# Patient Record
Sex: Female | Born: 1980 | Race: Black or African American | Hispanic: No | Marital: Married | Smoking: Never smoker
Health system: Southern US, Community
[De-identification: ages and names within clinical notes are randomized; demographics above are authoritative.]

## PROBLEM LIST (undated history)

## (undated) DIAGNOSIS — Z789 Other specified health status: Secondary | ICD-10-CM

## (undated) DIAGNOSIS — E039 Hypothyroidism, unspecified: Secondary | ICD-10-CM

## (undated) DIAGNOSIS — G4730 Sleep apnea, unspecified: Secondary | ICD-10-CM

---

## 2013-05-19 ENCOUNTER — Other Ambulatory Visit (HOSPITAL_COMMUNITY)
Admission: RE | Admit: 2013-05-19 | Discharge: 2013-05-19 | Disposition: A | Discharge status: Home / Self Care | Payer: Medicaid Other | Source: Ambulatory Visit | Attending: Emergency Medicine | Admitting: Emergency Medicine

## 2013-05-19 ENCOUNTER — Emergency Department (INDEPENDENT_AMBULATORY_CARE_PROVIDER_SITE_OTHER)
Admission: EM | Admit: 2013-05-19 | Discharge: 2013-05-19 | Disposition: A | Discharge status: Home / Self Care | Payer: Medicaid Other | Source: Home / Self Care | Attending: Emergency Medicine | Admitting: Emergency Medicine

## 2013-05-19 ENCOUNTER — Encounter (HOSPITAL_COMMUNITY): Payer: Self-pay | Admitting: Emergency Medicine

## 2013-05-19 DIAGNOSIS — N760 Acute vaginitis: Secondary | ICD-10-CM | POA: Insufficient documentation

## 2013-05-19 DIAGNOSIS — N930 Postcoital and contact bleeding: Secondary | ICD-10-CM

## 2013-05-19 DIAGNOSIS — B3731 Acute candidiasis of vulva and vagina: Secondary | ICD-10-CM

## 2013-05-19 DIAGNOSIS — B373 Candidiasis of vulva and vagina: Secondary | ICD-10-CM

## 2013-05-19 DIAGNOSIS — IMO0002 Reserved for concepts with insufficient information to code with codable children: Secondary | ICD-10-CM

## 2013-05-19 DIAGNOSIS — Z113 Encounter for screening for infections with a predominantly sexual mode of transmission: Secondary | ICD-10-CM | POA: Insufficient documentation

## 2013-05-19 LAB — POCT URINALYSIS DIP (DEVICE)
Bilirubin Urine: NEGATIVE
Glucose, UA: NEGATIVE mg/dL
Hgb urine dipstick: NEGATIVE
Nitrite: NEGATIVE
Urobilinogen, UA: 0.2 mg/dL (ref 0.0–1.0)

## 2013-05-19 LAB — POCT PREGNANCY, URINE: Preg Test, Ur: NEGATIVE

## 2013-05-19 MED ORDER — FLUCONAZOLE 150 MG PO TABS: 150.0000 mg | ORAL_TABLET | Freq: Once | ORAL | Status: DC

## 2013-05-19 NOTE — ED Notes (Signed)
Pt c/o lower abdominal pain x 3 month. Pain is worse during sexual intercourse. Feels a shooting pain. No diarrhea, n/v. No discharge. No problems with urinating or bowel movements. Patient is alert and oriented.

## 2013-05-19 NOTE — ED Provider Notes (Signed)
History     CSN: [id number]  Arrival date & time 05/19/13  1456   None     Chief Complaint  Patient presents with  . Abdominal Pain    (Consider location/radiation/quality/duration/timing/severity/associated sxs/prior treatment) HPI Comments: Pt presents c/o bleeding after intercourse for at least 6 months, pain with deep insertion during intercourse for at least 6 months, and sharp abdominal pains occasionally after intercourse for at least 3 months.     History reviewed. No pertinent past medical history.  Past Surgical History  Procedure Laterality Date  . Cesarean section      No family history on file.  History  Substance Use Topics  . Smoking status: Current Every Day Smoker    Types: Cigarettes  . Smokeless tobacco: Not on file  . Alcohol Use: No    OB History   Grav Para Term Preterm Abortions TAB SAB Ect Mult Living                  Review of Systems  Constitutional: Negative for fever and chills.  Eyes: Negative for visual disturbance.  Respiratory: Negative for cough and shortness of breath.   Cardiovascular: Negative for chest pain, palpitations and leg swelling.  Gastrointestinal: Negative for nausea, vomiting and abdominal pain.  Endocrine: Negative for polydipsia and polyuria.  Genitourinary: Positive for vaginal bleeding, vaginal pain, pelvic pain and dyspareunia. Negative for dysuria, urgency, frequency, hematuria, flank pain and genital sores.  Musculoskeletal: Negative for myalgias and arthralgias.  Skin: Negative for rash.  Neurological: Negative for dizziness, weakness and light-headedness.    Allergies  Review of patient's allergies indicates no known allergies.  Home Medications   Current Outpatient Rx  Name  Route  Sig  Dispense  Refill  . Multiple Vitamin (MULTIVITAMIN) tablet   Oral   Take 1 tablet by mouth daily.         . fluconazole (DIFLUCAN) 150 MG tablet   Oral   Take 1 tablet (150 mg total) by mouth once.   2  tablet   0     BP 132/75  Pulse 112  Temp(Src) 98.3 F (36.8 C) (Oral)  Resp 16  SpO2 100%  LMP 04/20/2013  Physical Exam  Nursing note and vitals reviewed. Constitutional: She is oriented to person, place, and time. Vital signs are normal. She appears well-developed and well-nourished. No distress.  HENT:  Head: Atraumatic.  Eyes: EOM are normal. Pupils are equal, round, and reactive to light.  Cardiovascular: Normal rate, regular rhythm and normal heart sounds.  Exam reveals no gallop and no friction rub.   No murmur heard. Pulmonary/Chest: Effort normal and breath sounds normal. No respiratory distress. She has no wheezes. She has no rales.  Abdominal: Soft. There is no tenderness.  Genitourinary: Cervix exhibits no motion tenderness and no discharge. Vaginal discharge (white, thin ) found.  Palpable polyp on the os of the cervix   Neurological: She is alert and oriented to person, place, and time. She has normal strength.  Skin: Skin is warm and dry. She is not diaphoretic.  Psychiatric: She has a normal mood and affect. Her behavior is normal. Judgment normal.    ED Course  Procedures (including critical care time)  Labs Reviewed  POCT URINALYSIS DIP (DEVICE) - Abnormal; Notable for the following:    Leukocytes, UA TRACE (*)    All other components within normal limits  POCT PREGNANCY, URINE   No results found.   1. Vulvovaginal candidiasis   2. Dyspareunia  3. PCB (post coital bleeding)       MDM  Discussed with pt that this problem needs to be worked up by her GYN.  She has an appt for next month.  In the meantime will treat for yeast infxn given the discharge.    Meds ordered this encounter  Medications  .       . fluconazole (DIFLUCAN) 150 MG tablet    Sig: Take 1 tablet (150 mg total) by mouth once.    Dispense:  2 tablet    Refill:  0           Graylon Good, PA-C 05/19/13 1659

## 2013-05-19 NOTE — ED Provider Notes (Signed)
Medical screening examination/treatment/procedure(s) were performed by non-physician practitioner and as supervising physician I was immediately available for consultation/collaboration.  Raynald Blend, MD 05/19/13 [phone]

## 2013-05-20 MED ORDER — AZITHROMYCIN 500 MG PO TABS: 1000.0000 mg | ORAL_TABLET | Freq: Once | ORAL | Status: DC

## 2013-05-23 NOTE — ED Notes (Signed)
Called patient and after confirming ID , discussed positive findings. Wad advised to pick up her RX ASAP, and to notify her partner, as this is transmissible

## 2013-05-23 NOTE — ED Notes (Signed)
Letter for [place] completed , faxed

## 2013-05-24 MED ORDER — METRONIDAZOLE 500 MG PO TABS: 500.0000 mg | ORAL_TABLET | Freq: Two times a day (BID) | ORAL | Status: DC

## 2013-05-24 NOTE — ED Notes (Signed)
Affirm:Gardnerella pos., Candida and Trich neg.  Discussed with Almedia Balls PA and order done for Flagyl. Rx. e-prescirbed to the [facility] on [address].  Pt. notified by [name] where pt. should pick up her Rx.   Pt. instructed to no alcohol while taking this medication also. Heather Kelly 05/24/2013

## 2015-06-15 ENCOUNTER — Encounter (HOSPITAL_COMMUNITY): Payer: Self-pay | Admitting: Emergency Medicine

## 2015-06-15 ENCOUNTER — Emergency Department (HOSPITAL_COMMUNITY)
Admission: EM | Admit: 2015-06-15 | Discharge: 2015-06-15 | Disposition: A | Discharge status: Home / Self Care | Payer: Medicaid Other | Attending: Emergency Medicine | Admitting: Emergency Medicine

## 2015-06-15 DIAGNOSIS — S199XXA Unspecified injury of neck, initial encounter: Secondary | ICD-10-CM | POA: Diagnosis not present

## 2015-06-15 DIAGNOSIS — Y92410 Unspecified street and highway as the place of occurrence of the external cause: Secondary | ICD-10-CM | POA: Insufficient documentation

## 2015-06-15 DIAGNOSIS — Y998 Other external cause status: Secondary | ICD-10-CM | POA: Diagnosis not present

## 2015-06-15 DIAGNOSIS — Z79899 Other long term (current) drug therapy: Secondary | ICD-10-CM | POA: Diagnosis not present

## 2015-06-15 DIAGNOSIS — S46812A Strain of other muscles, fascia and tendons at shoulder and upper arm level, left arm, initial encounter: Secondary | ICD-10-CM | POA: Diagnosis not present

## 2015-06-15 DIAGNOSIS — S39012A Strain of muscle, fascia and tendon of lower back, initial encounter: Secondary | ICD-10-CM

## 2015-06-15 DIAGNOSIS — Y9389 Activity, other specified: Secondary | ICD-10-CM | POA: Insufficient documentation

## 2015-06-15 DIAGNOSIS — Z720 Tobacco use: Secondary | ICD-10-CM | POA: Diagnosis not present

## 2015-06-15 DIAGNOSIS — S4992XA Unspecified injury of left shoulder and upper arm, initial encounter: Secondary | ICD-10-CM | POA: Diagnosis present

## 2015-06-15 MED ORDER — HYDROCODONE-ACETAMINOPHEN 5-325 MG PO TABS
1.0000 | ORAL_TABLET | Freq: Once | ORAL | Status: AC
  Administered 2015-06-15: 1 via ORAL
  Filled 2015-06-15: qty 1

## 2015-06-15 MED ORDER — CYCLOBENZAPRINE HCL 10 MG PO TABS
10.0000 mg | ORAL_TABLET | Freq: Once | ORAL | Status: AC
  Administered 2015-06-15: 10 mg via ORAL
  Filled 2015-06-15: qty 1

## 2015-06-15 MED ORDER — CYCLOBENZAPRINE HCL 10 MG PO TABS: 10.0000 mg | ORAL_TABLET | Freq: Two times a day (BID) | ORAL | Status: DC | PRN

## 2015-06-15 MED ORDER — NAPROXEN 500 MG PO TABS: 500.0000 mg | ORAL_TABLET | Freq: Two times a day (BID) | ORAL | Status: DC

## 2015-06-15 MED ORDER — IBUPROFEN 400 MG PO TABS
600.0000 mg | ORAL_TABLET | Freq: Once | ORAL | Status: AC
  Administered 2015-06-15: 600 mg via ORAL
  Filled 2015-06-15: qty 2
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
## 2015-09-01 ENCOUNTER — Inpatient Hospital Stay (HOSPITAL_COMMUNITY)
Admission: AD | Admit: 2015-09-01 | Discharge: 2015-09-01 | Disposition: A | Discharge status: Home / Self Care | Payer: Medicaid Other | Source: Ambulatory Visit | Attending: Obstetrics and Gynecology | Admitting: Obstetrics and Gynecology

## 2015-09-01 DIAGNOSIS — B9689 Other specified bacterial agents as the cause of diseases classified elsewhere: Secondary | ICD-10-CM

## 2015-09-01 DIAGNOSIS — Z202 Contact with and (suspected) exposure to infections with a predominantly sexual mode of transmission: Secondary | ICD-10-CM | POA: Diagnosis not present

## 2015-09-01 DIAGNOSIS — F17210 Nicotine dependence, cigarettes, uncomplicated: Secondary | ICD-10-CM | POA: Diagnosis not present

## 2015-09-01 DIAGNOSIS — A499 Bacterial infection, unspecified: Secondary | ICD-10-CM

## 2015-09-01 DIAGNOSIS — N760 Acute vaginitis: Secondary | ICD-10-CM | POA: Diagnosis not present

## 2015-09-01 DIAGNOSIS — N898 Other specified noninflammatory disorders of vagina: Secondary | ICD-10-CM | POA: Diagnosis present

## 2015-09-01 LAB — URINALYSIS, ROUTINE W REFLEX MICROSCOPIC
Bilirubin Urine: NEGATIVE
GLUCOSE, UA: NEGATIVE mg/dL
Hgb urine dipstick: NEGATIVE
Ketones, ur: NEGATIVE mg/dL
LEUKOCYTES UA: NEGATIVE
Nitrite: NEGATIVE
PH: 5.5 (ref 5.0–8.0)
Protein, ur: NEGATIVE mg/dL
Urobilinogen, UA: 0.2 mg/dL (ref 0.0–1.0)

## 2015-09-01 LAB — WET PREP, GENITAL
TRICH WET PREP: NONE SEEN
Yeast Wet Prep HPF POC: NONE SEEN

## 2015-09-01 LAB — RAPID HIV SCREEN (HIV 1/2 AB+AG)
HIV 1/2 Antibodies: NONREACTIVE
HIV-1 P24 ANTIGEN - HIV24: NONREACTIVE

## 2015-09-01 LAB — POCT PREGNANCY, URINE: Preg Test, Ur: NEGATIVE

## 2015-09-01 LAB — HEPATITIS B SURFACE ANTIGEN: HEP B S AG: NEGATIVE

## 2015-09-01 MED ORDER — AZITHROMYCIN 250 MG PO TABS
1000.0000 mg | ORAL_TABLET | Freq: Once | ORAL | Status: AC
  Administered 2015-09-01: 1000 mg via ORAL
  Filled 2015-09-01: qty 4

## 2015-09-01 MED ORDER — METRONIDAZOLE 500 MG PO TABS: 500.0000 mg | ORAL_TABLET | Freq: Two times a day (BID) | ORAL | Status: AC

## 2015-09-01 MED ORDER — CEFTRIAXONE SODIUM 250 MG IJ SOLR
250.0000 mg | Freq: Once | INTRAMUSCULAR | Status: AC
  Administered 2015-09-01: 250 mg via INTRAMUSCULAR
  Filled 2015-09-01: qty 250
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
## 2015-09-01 NOTE — MAU Note (Signed)
Pt left as she needed to.

## 2015-09-01 NOTE — Discharge Instructions (Signed)
Safe Sex Safe sex is about reducing the risk of giving or getting a sexually transmitted disease (STD). STDs are spread through sexual contact involving the genitals, mouth, or rectum. Some STDs can be cured and others cannot. Safe sex can also prevent unintended pregnancies.  WHAT ARE SOME SAFE SEX PRACTICES?  Limit your sexual activity to only one partner who is having sex with only you.  Talk to your partner about his or her past partners, past STDs, and drug use.  Use a condom every time you have sexual intercourse. This includes vaginal, oral, and anal sexual activity. Both females and males should wear condoms during oral sex. Only use latex or polyurethane condoms and water-based lubricants. Using petroleum-based lubricants or oils to lubricate a condom will weaken the condom and increase the chance that it will break. The condom should be in place from the beginning to the end of sexual activity. Wearing a condom reduces, but does not completely eliminate, your risk of getting or giving an STD. STDs can be spread by contact with infected body fluids and skin.  Get vaccinated for hepatitis B and HPV.  Avoid alcohol and recreational drugs, which can affect your judgment. You may forget to use a condom or participate in high-risk sex.  For females, avoid douching after sexual intercourse. Douching can spread an infection farther into the reproductive tract.  Check your body for signs of sores, blisters, rashes, or unusual discharge. See your health care provider if you notice any of these signs.  Avoid sexual contact if you have symptoms of an infection or are being treated for an STD. If you or your partner has herpes, avoid sexual contact when blisters are present. Use condoms at all other times.  If you are at risk of being infected with HIV, it is recommended that you take a prescription medicine daily to prevent HIV infection. This is called pre-exposure prophylaxis (PrEP). You are  considered at risk if:  You are a man who has sex with other men (MSM).  You are a heterosexual man or woman who is sexually active with more than one partner.  You take drugs by injection.  You are sexually active with a partner who has HIV.  Talk with your health care provider about whether you are at high risk of being infected with HIV. If you choose to begin PrEP, you should first be tested for HIV. You should then be tested every 3 months for as long as you are taking PrEP.  See your health care provider for regular screenings, exams, and tests for other STDs. Before having sex with a new partner, each of you should be screened for STDs and should talk about the results with each other. WHAT ARE THE BENEFITS OF SAFE SEX?   There is less chance of getting or giving an STD.  You can prevent unwanted or unintended pregnancies.  By discussing safe sex concerns with your partner, you may increase feelings of intimacy, comfort, trust, and honesty between the two of you. Document Released: 01/09/2005 Document Revised: 04/18/2014 Document Reviewed: 05/25/2012 [facility] Patient Information 2015 [place], [place]. This information is not intended to replace advice given to you by your health care provider. Make sure you discuss any questions you have with your health care provider. Chlamydia Test This a test to see if you have Chlamydia which is a common sexually transmitted disease (STD). You get this disease by having sexual contact (oral, vaginal, or anal) with an infected person. An  infected mother can spread the disease to her baby during childbirth. If this test is positive, you have a sexually transmitted disease (STD). DO NOT have unprotected sex until the treatment is complete and you have been retested and are negative. PREPARATION FOR TEST Your caregiver will use a swab to take a sample. The swab may be from the cervix, urethra, penis, anus, or throat. It may be possible to use a urine  sample, if the lab where the sample is sent is able to test urine for this disease. NORMAL FINDINGS  No Growth  Antibodies: less than 1:640 Ranges for normal findings may vary among different laboratories and hospitals. You should always check with your doctor after having lab work or other tests done to discuss the meaning of your test results and whether your values are considered within normal limits. MEANING OF TEST  Your caregiver will go over the test results with you and discuss the importance and meaning of your results, as well as treatment options and the need for additional tests if necessary. OBTAINING THE TEST RESULTS It is your responsibility to obtain your test results. Ask the lab or department performing the test when and how you will get your results. Document Released: 12/25/2004 Document Revised: 02/24/2012 Document Reviewed: 11/10/2008 [facility] Patient Information 2015 [place], [place]. This information is not intended to replace advice given to you by your health care provider. Make sure you discuss any questions you have with your health care provider. Gonorrhea Gonorrhea is an infection that can cause serious problems. If left untreated, the infection may:   Damage the female or female organs.   Cause women to be unable to have children (sterility).   Harm a fetus if the infected woman is pregnant.  It is important to get treatment for gonorrhea as soon as possible. It is also necessary that all your sexual partners be tested for the infection.  CAUSES  Gonorrhea is caused by bacteria called Neisseria gonorrhoeae. The infection is spread from person to person, usually by sexual contact (such as by anal, vaginal, or oral means). A newborn can contract the infection from his or her mother during birth.  SYMPTOMS  Some people with gonorrhea do not have symptoms. Symptoms may be different in females and males.  Females The most common symptoms are:   Pain in the  lower abdomen.   Fever with or without chills.  Other symptoms include:   Abnormal vaginal discharge.   Painful intercourse.   Burning or itching of the vagina or lips of the vagina.   Abnormal vaginal bleeding.   Pain when urinating.   Long-lasting (chronic) pain in the lower abdomen, especially during menstruation or intercourse.   Inability to become pregnant.   Going into premature labor.   Irritation, pain, bleeding, or discharge from the rectum. This may occur if the infection was spread by anal sex.   Sore throat or swollen lymph nodes in the neck. This may occur if the infection was spread by oral sex.  Males The most common symptoms are:   Discharge from the penis.   Pain or burning during urination.   Pain or swelling in the testicles. Other symptoms may include:   Irritation, pain, bleeding, or discharge from the rectum. This may occur if the infection was spread by anal sex.   Sore throat, fever, or swollen lymph nodes in the neck. This may occur if the infection was spread by oral sex.  DIAGNOSIS  A diagnosis is made  after a physical exam is done and a sample of discharge is examined under a microscope for the presence of the bacteria. The discharge may be taken from the urethra, cervix, throat, or rectum.  TREATMENT  Gonorrhea is treated with antibiotic medicines. It is important for treatment to begin as soon as possible. Early treatment may prevent some problems from developing.  HOME CARE INSTRUCTIONS   Take medicines only as directed by your health care provider.   Take your antibiotic medicine as directed by your health care provider. Finish the antibiotic even if you start to feel better. Incomplete treatment will put you at risk for continued infection.   Do not have sex until treatment is complete or as directed by your health care provider.   Keep all follow-up visits as directed by your health care provider.   Not all  test results are available during your visit. If your test results are not back during the visit, make an appointment with your health care provider to find out the results. Do not assume everything is normal if you have not heard from your health care provider or the medical facility. It is your responsibility to get your test results.  If you test positive for gonorrhea, inform your recent sexual partners. They need to be checked for gonorrhea even if they do not have symptoms. They may need treatment, even if they test negative for gonorrhea.  SEEK MEDICAL CARE IF:   You develop any bad reaction to the medicine you were prescribed. This may include:   A rash.   Nausea.   Vomiting.   Diarrhea.   Your symptoms do not improve after a few days of taking antibiotics.   Your symptoms get worse.   You develop increased pain, such as in the testicles (for males) or in the abdomen (for females).  You have a fever. MAKE SURE YOU:   Understand these instructions.  Will watch your condition.  Will get help right away if you are not doing well or get worse. Document Released: 11/29/2000 Document Revised: 04/18/2014 Document Reviewed: 06/09/2013 [facility] Patient Information 2015 [place], [place]. This information is not intended to replace advice given to you by your health care provider. Make sure you discuss any questions you have with your health care provider.

## 2015-09-01 NOTE — MAU Note (Signed)
Pt here because husband's other partner has an STD and pt wants STD and HIV testing

## 2015-09-01 NOTE — MAU Provider Note (Signed)
History     CSN: [id number]  Arrival date and time: 09/01/15 4098   First Provider Initiated Contact with Patient 09/01/15 1010      Chief Complaint  Patient presents with  . Vaginal Discharge    foul odor   Vaginal Discharge The patient's primary symptoms include a genital odor and vaginal discharge. The patient's pertinent negatives include no genital itching, genital lesions, genital rash, missed menses, pelvic pain or vaginal bleeding. This is a new problem. The current episode started in the past 7 days. The problem occurs constantly. The problem has been unchanged. The patient is experiencing no pain. She is not pregnant. Pertinent negatives include no abdominal pain, back pain, chills, constipation, diarrhea, dysuria, fever, headaches, nausea or vomiting. The vaginal discharge was milky. There has been no bleeding. She has not been passing clots. She has not been passing tissue. Nothing aggravates the symptoms. She has tried nothing for the symptoms. She is sexually active. Yes, her partner has an STD. Her menstrual history has been regular.   This is a 34 y.o. female who presents with complaint of vaginal odor and exposure to STDs.  States her husband's "other partner" called her and told her she needed to leave her husband and that she has gonorrhea and chlamydia.  States her husband did not tell her. Unsure if he has been treated. States has had discharge with odor but no pain or abnormal bleeding.   RN Note: Pt here because husband's other partner has an STD and pt wants STD and HIV testing          OB History    No data available      No past medical history on file.  Past Surgical History  Procedure Laterality Date  . Cesarean section      No family history on file.  Social History  Substance Use Topics  . Smoking status: Current Every Day Smoker    Types: Cigarettes  . Smokeless tobacco: Not on file  . Alcohol Use: Yes    Allergies: No Known  Allergies  Prescriptions prior to admission  Medication Sig Dispense Refill Last Dose  . amoxicillin (AMOXIL) 500 MG capsule Take 1,000 mg by mouth daily.   Past Week at Unknown time  . Prenatal Vit-Fe Fumarate-FA (PRENATAL MULTIVITAMIN) TABS tablet Take 1 tablet by mouth daily at 12 noon.   Past Week at Unknown time  . azithromycin (ZITHROMAX) 500 MG tablet Take 2 tablets (1,000 mg total) by mouth once. 2 tablet 0   . cyclobenzaprine (FLEXERIL) 10 MG tablet Take 1 tablet (10 mg total) by mouth 2 (two) times daily as needed for muscle spasms. 20 tablet 0   . fluconazole (DIFLUCAN) 150 MG tablet Take 1 tablet (150 mg total) by mouth once. 2 tablet 0   . metroNIDAZOLE (FLAGYL) 500 MG tablet Take 1 tablet (500 mg total) by mouth 2 (two) times daily. 14 tablet 0   . naproxen (NAPROSYN) 500 MG tablet Take 1 tablet (500 mg total) by mouth 2 (two) times daily. 30 tablet 0    Medical, Surgical, Family and Social histories reviewed and are listed above.  Medications and allergies reviewed.   Review of Systems  Constitutional: Negative for fever and chills.  Gastrointestinal: Negative for nausea, vomiting, abdominal pain, diarrhea and constipation.  Genitourinary: Positive for vaginal discharge. Negative for dysuria, pelvic pain and missed menses.  Musculoskeletal: Negative for back pain.  Neurological: Negative for headaches.  Other systems neg  Physical Exam  Blood pressure 126/73, pulse 79, temperature 97.6 F (36.4 C), resp. rate 18, height 5\' 9"  (1.753 m), weight 333 lb 8 oz (151.275 kg), last menstrual period 08/19/2015.  Physical Exam  Constitutional: She is oriented to person, place, and time. She appears well-developed and well-nourished. No distress.  HENT:  Head: Normocephalic.  Cardiovascular: Normal rate and regular rhythm.   Respiratory: No respiratory distress.  GI: Soft. She exhibits no distension. There is no tenderness. There is no rebound and no guarding.  Genitourinary:  Vaginal discharge (thin white) found.  No CMT Uterus not palpable due to habitus  Musculoskeletal: Normal range of motion.  Neurological: She is alert and oriented to person, place, and time.  Skin: Skin is warm and dry.  Psychiatric: She has a normal mood and affect.    MAU Course  Procedures  MDM STD testing done Rocephin ordered for GC treatment Zithromax ordered for Chlamydia treatment  Results for orders placed or performed during the hospital encounter of 09/01/15 (from the past 24 hour(s))  Urinalysis, Routine w reflex microscopic (not at [facility])     Status: Abnormal   Collection Time: 09/01/15  8:58 AM  Result Value Ref Range   Color, Urine YELLOW YELLOW   APPearance CLEAR CLEAR   Specific Gravity, Urine >1.030 (H) 1.005 - 1.030   pH 5.5 5.0 - 8.0   Glucose, UA NEGATIVE NEGATIVE mg/dL   Hgb urine dipstick NEGATIVE NEGATIVE   Bilirubin Urine NEGATIVE NEGATIVE   Ketones, ur NEGATIVE NEGATIVE mg/dL   Protein, ur NEGATIVE NEGATIVE mg/dL   Urobilinogen, UA 0.2 0.0 - 1.0 mg/dL   Nitrite NEGATIVE NEGATIVE   Leukocytes, UA NEGATIVE NEGATIVE  Pregnancy, urine POC     Status: None   Collection Time: 09/01/15  9:01 AM  Result Value Ref Range   Preg Test, Ur NEGATIVE NEGATIVE  Wet prep, genital     Status: Abnormal   Collection Time: 09/01/15 10:15 AM  Result Value Ref Range   Yeast Wet Prep HPF POC NONE SEEN NONE SEEN   Trich, Wet Prep NONE SEEN NONE SEEN   Clue Cells Wet Prep HPF POC FEW (A) NONE SEEN   WBC, Wet Prep HPF POC FEW (A) NONE SEEN  Rapid HIV screen (HIV 1/2 Ab+Ag)     Status: None   Collection Time: 09/01/15 10:40 AM  Result Value Ref Range   HIV-1 P24 Antigen - HIV24 NON REACTIVE NON REACTIVE   HIV 1/2 Antibodies NON REACTIVE NON REACTIVE   Interpretation (HIV Ag Ab)      A non reactive test result means that HIV 1 or HIV 2 antibodies and HIV 1 p24 antigen were not detected in the specimen.    Assessment and Plan  A:  STD exposure       Bacterial  Vaginosis  P;  Discharge home       Rx Metronidazole for BV       Safe sex reviewed        Discussed going with husband when he gets treated to verify        Offered partner tx, but she declined  [facility] 09/01/2015, 10:57 AM

## 2015-09-02 LAB — RPR: RPR Ser Ql: NONREACTIVE

## 2015-09-04 LAB — GC/CHLAMYDIA PROBE AMP (~~LOC~~) NOT AT ARMC
CHLAMYDIA, DNA PROBE: NEGATIVE
NEISSERIA GONORRHEA: NEGATIVE

## 2016-07-26 ENCOUNTER — Emergency Department (HOSPITAL_COMMUNITY)
Admission: EM | Admit: 2016-07-26 | Discharge: 2016-07-26 | Disposition: A | Discharge status: Home / Self Care | Payer: Medicaid Other | Attending: Emergency Medicine | Admitting: Emergency Medicine

## 2016-07-26 ENCOUNTER — Emergency Department (HOSPITAL_COMMUNITY): Payer: Medicaid Other

## 2016-07-26 ENCOUNTER — Encounter (HOSPITAL_COMMUNITY): Payer: Self-pay | Admitting: Emergency Medicine

## 2016-07-26 DIAGNOSIS — R519 Headache, unspecified: Secondary | ICD-10-CM

## 2016-07-26 DIAGNOSIS — R109 Unspecified abdominal pain: Secondary | ICD-10-CM | POA: Diagnosis not present

## 2016-07-26 DIAGNOSIS — Y92410 Unspecified street and highway as the place of occurrence of the external cause: Secondary | ICD-10-CM | POA: Insufficient documentation

## 2016-07-26 DIAGNOSIS — Z79899 Other long term (current) drug therapy: Secondary | ICD-10-CM | POA: Insufficient documentation

## 2016-07-26 DIAGNOSIS — R938 Abnormal findings on diagnostic imaging of other specified body structures: Secondary | ICD-10-CM | POA: Insufficient documentation

## 2016-07-26 DIAGNOSIS — Y939 Activity, unspecified: Secondary | ICD-10-CM | POA: Diagnosis not present

## 2016-07-26 DIAGNOSIS — M7918 Myalgia, other site: Secondary | ICD-10-CM

## 2016-07-26 DIAGNOSIS — M791 Myalgia: Secondary | ICD-10-CM | POA: Insufficient documentation

## 2016-07-26 DIAGNOSIS — F17210 Nicotine dependence, cigarettes, uncomplicated: Secondary | ICD-10-CM | POA: Insufficient documentation

## 2016-07-26 DIAGNOSIS — Y999 Unspecified external cause status: Secondary | ICD-10-CM | POA: Diagnosis not present

## 2016-07-26 DIAGNOSIS — R51 Headache: Secondary | ICD-10-CM | POA: Diagnosis present

## 2016-07-26 LAB — I-STAT BETA HCG BLOOD, ED (MC, WL, AP ONLY): I-stat hCG, quantitative: <5 m[IU]/mL (ref <5)

## 2016-07-26 MED ORDER — IOPAMIDOL (ISOVUE-300) INJECTION 61%
INTRAVENOUS | Status: AC
  Administered 2016-07-26: 100 mL
  Filled 2016-07-26: qty 100

## 2016-07-26 MED ORDER — METHOCARBAMOL 500 MG PO TABS
1000.0000 mg | ORAL_TABLET | Freq: Once | ORAL | Status: AC
Start: 2016-07-26 — End: 2016-07-26
  Administered 2016-07-26: 1000 mg via ORAL
  Filled 2016-07-26: qty 2

## 2016-07-26 MED ORDER — NAPROXEN 500 MG PO TABS: 500.0000 mg | ORAL_TABLET | Freq: Two times a day (BID) | ORAL | 0 refills | Status: DC | PRN

## 2016-07-26 MED ORDER — METHOCARBAMOL 500 MG PO TABS: 500.0000 mg | ORAL_TABLET | Freq: Two times a day (BID) | ORAL | 0 refills | Status: DC | PRN

## 2016-07-26 MED ORDER — KETOROLAC TROMETHAMINE 30 MG/ML IJ SOLN
30.0000 mg | Freq: Once | INTRAMUSCULAR | Status: AC
  Administered 2016-07-26: 30 mg via INTRAVENOUS
  Filled 2016-07-26: qty 1

## 2016-07-26 MED ORDER — HYDROCODONE-ACETAMINOPHEN 5-325 MG PO TABS: 1.0000 | ORAL_TABLET | Freq: Four times a day (QID) | ORAL | 0 refills | Status: DC | PRN
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
## 2016-07-26 NOTE — ED Notes (Signed)
PA states patient must have spinal CT's before x-rays

## 2016-10-28 ENCOUNTER — Emergency Department (HOSPITAL_COMMUNITY)
Admission: EM | Admit: 2016-10-28 | Discharge: 2016-10-28 | Disposition: A | Discharge status: Home / Self Care | Payer: Medicaid Other | Attending: Emergency Medicine | Admitting: Emergency Medicine

## 2016-10-28 ENCOUNTER — Encounter (HOSPITAL_COMMUNITY): Payer: Self-pay | Admitting: *Deleted

## 2016-10-28 DIAGNOSIS — Z79899 Other long term (current) drug therapy: Secondary | ICD-10-CM | POA: Diagnosis not present

## 2016-10-28 DIAGNOSIS — F17210 Nicotine dependence, cigarettes, uncomplicated: Secondary | ICD-10-CM | POA: Diagnosis not present

## 2016-10-28 DIAGNOSIS — N898 Other specified noninflammatory disorders of vagina: Secondary | ICD-10-CM | POA: Diagnosis present

## 2016-10-28 DIAGNOSIS — H109 Unspecified conjunctivitis: Secondary | ICD-10-CM | POA: Insufficient documentation

## 2016-10-28 DIAGNOSIS — Z202 Contact with and (suspected) exposure to infections with a predominantly sexual mode of transmission: Secondary | ICD-10-CM | POA: Diagnosis not present

## 2016-10-28 DIAGNOSIS — A5901 Trichomonal vulvovaginitis: Secondary | ICD-10-CM

## 2016-10-28 LAB — URINALYSIS, ROUTINE W REFLEX MICROSCOPIC
Bilirubin Urine: NEGATIVE
Glucose, UA: NEGATIVE mg/dL
Hgb urine dipstick: NEGATIVE
KETONES UR: NEGATIVE mg/dL
NITRITE: NEGATIVE
PH: 6 (ref 5.0–8.0)
PROTEIN: NEGATIVE mg/dL
Specific Gravity, Urine: 1.024 (ref 1.005–1.030)

## 2016-10-28 LAB — URINE MICROSCOPIC-ADD ON: RBC / HPF: NONE SEEN RBC/hpf (ref 0–5)

## 2016-10-28 LAB — POC URINE PREG, ED: PREG TEST UR: NEGATIVE

## 2016-10-28 LAB — WET PREP, GENITAL
SPERM: NONE SEEN
Yeast Wet Prep HPF POC: NONE SEEN

## 2016-10-28 MED ORDER — TETRACAINE HCL 0.5 % OP SOLN
2.0000 [drp] | Freq: Once | OPHTHALMIC | Status: AC
  Administered 2016-10-28: 2 [drp] via OPHTHALMIC
  Filled 2016-10-28: qty 4

## 2016-10-28 MED ORDER — AZITHROMYCIN 250 MG PO TABS
1000.0000 mg | ORAL_TABLET | Freq: Once | ORAL | Status: AC
  Administered 2016-10-28: 1000 mg via ORAL
  Filled 2016-10-28: qty 4

## 2016-10-28 MED ORDER — FLUORESCEIN SODIUM 1 MG OP STRP
1.0000 | ORAL_STRIP | Freq: Once | OPHTHALMIC | Status: AC
  Administered 2016-10-28: 1 via OPHTHALMIC
  Filled 2016-10-28: qty 1

## 2016-10-28 MED ORDER — ERYTHROMYCIN 5 MG/GM OP OINT: TOPICAL_OINTMENT | OPHTHALMIC | 0 refills | Status: DC

## 2016-10-28 MED ORDER — LIDOCAINE HCL 1 % IJ SOLN
INTRAMUSCULAR | Status: AC
  Administered 2016-10-28: 20 mL
  Filled 2016-10-28: qty 20

## 2016-10-28 MED ORDER — CEFTRIAXONE SODIUM 250 MG IJ SOLR
250.0000 mg | Freq: Once | INTRAMUSCULAR | Status: AC
  Administered 2016-10-28: 250 mg via INTRAMUSCULAR
  Filled 2016-10-28: qty 250

## 2016-10-28 MED ORDER — METRONIDAZOLE 500 MG PO TABS: 500.0000 mg | ORAL_TABLET | Freq: Two times a day (BID) | ORAL | 0 refills | Status: DC

## 2016-10-28 NOTE — ED Provider Notes (Signed)
[facility]EMER[facility] DEPT Provider Note   CSN: [id number] Arrival date & time: 10/28/16  1145     History   Chief Complaint Chief Complaint  Patient presents with  . Eye Drainage  . Exposure to STD    HPI Heather Kelly is a 35 y.o. female.  35 year old African-American female with no significant past medical history presents to the ED today with multiple complaints including right eye drainage and pruritus along with vaginal discharge and odor after being exposed to STD. Patient states that approximately 2 days ago she woke up with right eye crusting and mild swelling. Patient states that this self resolves as the day progresses. Denies any visual changes. States this is gradually improving. She has not tried anything at home. She was concerned she might have "pinkeye". Patient also reports mild right eye tearing. This is associated with clear rhinorrhea, sinus congestion, sore throat that resolved yesterday.  Patient states that she has had a white, malodorous vaginal discharge for the past 2 weeks. Patient states she had unprotected sex 2 months ago with one female partner. She informed yesterday that he tested positive for gonorrhea, chlamydia, BV and that she needed to be tested and treated. Patient denies any fever, chills, headache, vision changes, chest pain, shortness of breath, abdominal pain, nausea, emesis, urinary symptoms, vaginal bleeding, numbness/tingling.    The history is provided by the patient.  Exposure to STD  Pertinent negatives include no chest pain, no abdominal pain, no headaches and no shortness of breath.    History reviewed. No pertinent past medical history.  There are no active problems to display for this patient.   Past Surgical History:  Procedure Laterality Date  . CESAREAN SECTION    . CESAREAN SECTION      OB History    No data available       Home Medications    Prior to Admission medications   Medication Sig Start Date End Date Taking?  Authorizing Provider  folic acid (FOLVITE) 0.5 MG tablet Take 0.5 mg by mouth daily.    Historical Provider, MD  HYDROcodone-acetaminophen (NORCO) 5-325 MG tablet Take 1 tablet by mouth every 6 (six) hours as needed for moderate pain. 07/26/16   Chase Picket Ward, PA-C  methocarbamol (ROBAXIN) 500 MG tablet Take 1 tablet (500 mg total) by mouth 2 (two) times daily as needed for muscle spasms. 07/26/16   Chase Picket Ward, PA-C  Multiple Vitamins-Minerals (MULTIVITAMIN WITH MINERALS) tablet Take 1 tablet by mouth daily.    Historical Provider, MD  naproxen (NAPROSYN) 500 MG tablet Take 1 tablet (500 mg total) by mouth 2 (two) times daily as needed. 07/26/16   Chase Picket Ward, PA-C  Omega-3 Fatty Acids (FISH OIL) 1000 MG CAPS Take 1,000 mg by mouth daily.    Historical Provider, MD  vitamin C (ASCORBIC ACID) 500 MG tablet Take 500 mg by mouth daily.    Historical Provider, MD    Family History No family history on file.  Social History Social History  Substance Use Topics  . Smoking status: Current Every Day Smoker    Types: Cigarettes  . Smokeless tobacco: Never Used  . Alcohol use Yes     Allergies   Patient has no known allergies.   Review of Systems Review of Systems  Constitutional: Negative for chills and fever.  HENT: Negative for ear pain and sore throat.   Eyes: Positive for discharge and itching. Negative for photophobia, pain and visual disturbance.  Respiratory: Negative for  cough and shortness of breath.   Cardiovascular: Negative for chest pain and palpitations.  Gastrointestinal: Negative for abdominal pain, diarrhea, nausea and vomiting.  Genitourinary: Positive for vaginal discharge and vaginal pain. Negative for dysuria, flank pain, frequency, hematuria, urgency and vaginal bleeding.  Musculoskeletal: Negative for arthralgias and back pain.  Skin: Negative for color change and rash.  Neurological: Negative for dizziness, syncope, weakness, light-headedness,  numbness and headaches.  All other systems reviewed and are negative.    Physical Exam Updated Vital Signs BP 121/65 (BP Location: Right Arm)   Pulse 84   Temp 98.2 F (36.8 C) (Oral)   Resp 18   LMP 10/14/2016   SpO2 94%   Physical Exam  Constitutional: She appears well-developed and well-nourished. No distress.  HENT:  Head: Normocephalic and atraumatic.  Right Ear: Tympanic membrane, external ear and ear canal normal.  Left Ear: Tympanic membrane, external ear and ear canal normal.  Nose: No rhinorrhea.  Mouth/Throat: Uvula is midline, oropharynx is clear and moist and mucous membranes are normal.  Eyes: Conjunctivae, EOM and lids are normal. Pupils are equal, round, and reactive to light. Lids are everted and swept, no foreign bodies found. Right eye exhibits no discharge, no exudate and no hordeolum. Left eye exhibits no discharge, no exudate and no hordeolum. Right conjunctiva is not injected. Left conjunctiva is not injected. No scleral icterus.  Slit lamp exam:      The right eye shows no corneal abrasion, no corneal ulcer, no foreign body and no fluorescein uptake.  No signs of purulent discharge. No lid edema.  Neck: Normal range of motion. Neck supple. No thyromegaly present.  Cardiovascular: Normal rate, regular rhythm, normal heart sounds and intact distal pulses.   Pulmonary/Chest: Effort normal and breath sounds normal.  Abdominal: Soft. Bowel sounds are normal. She exhibits no distension. There is no tenderness. There is no rebound and no guarding.  Genitourinary: Vagina normal. There is no rash or tenderness on the right labia. There is no rash or tenderness on the left labia. Cervix exhibits no motion tenderness and no discharge. Right adnexum displays no mass, no tenderness and no fullness. Left adnexum displays no mass, no tenderness and no fullness. No erythema, tenderness or bleeding in the vagina.  Genitourinary Comments: Chaperone present for exam.    Musculoskeletal: Normal range of motion.  Lymphadenopathy:    She has no cervical adenopathy.  Neurological: She is alert.  Skin: Skin is warm and dry. Capillary refill takes less than 2 seconds.  Nursing note and vitals reviewed.     Visual Acuity  Right Eye Distance: 20/30 Left Eye Distance: 20/30 Bilateral Distance: 20/30  Right Eye Near:   Left Eye Near:    Bilateral Near:     ED Treatments / Results  Labs (all labs ordered are listed, but only abnormal results are displayed) Labs Reviewed  WET PREP, GENITAL - Abnormal; Notable for the following:       Result Value   Trich, Wet Prep PRESENT (*)    Clue Cells Wet Prep HPF POC PRESENT (*)    WBC, Wet Prep HPF POC FEW (*)    All other components within normal limits  URINALYSIS, ROUTINE W REFLEX MICROSCOPIC (NOT [facility]) - Abnormal; Notable for the following:    APPearance CLOUDY (*)    Leukocytes, UA SMALL (*)    All other components within normal limits  URINE MICROSCOPIC-ADD ON - Abnormal; Notable for the following:    Squamous Epithelial /  LPF 0-5 (*)    Bacteria, UA FEW (*)    All other components within normal limits  URINE CULTURE  RPR  HIV ANTIBODY (ROUTINE TESTING)  POC URINE PREG, ED  GC/CHLAMYDIA PROBE AMP ([place]) NOT AT [facility]    EKG  EKG Interpretation None       Radiology No results found.  Procedures Procedures (including critical care time)  Medications Ordered in ED Medications  cefTRIAXone (ROCEPHIN) injection 250 mg (250 mg Intramuscular Given 10/28/16 1703)  azithromycin (ZITHROMAX) tablet 1,000 mg (1,000 mg Oral Given 10/28/16 1703)  fluorescein ophthalmic strip 1 strip (1 strip Right Eye Given 10/28/16 1703)  tetracaine (PONTOCAINE) 0.5 % ophthalmic solution 2 drop (2 drops Right Eye Given 10/28/16 1703)  lidocaine (XYLOCAINE) 1 % (with pres) injection (20 mLs  Given 10/28/16 1703)     Initial Impression / Assessment and Plan / ED Course  I have reviewed the triage vital  signs and the nursing notes.  Pertinent labs & imaging results that were available during my care of the patient were reviewed by me and considered in my medical decision making (see chart for details).  Clinical Course   Patient treated in the ED for STI with rocephin, azithromycin, and flagly given positive for trich. Educated patient to safe practice with flagyl. Patient advised to inform and treat all sexual partners.  Pt advised on safe sex practices and understands that they have GC/Chlamydia cultures pending and will result in 2-3 days. HIV and RPR sent. Pt encouraged to follow up at local health department for future STI checks. No concern for PID given no CMT. Pelvic and abd exam was unremarkable.   Patient presentation consistent with conjunctivitis likely viral.  No purulent discharge, corneal abrasions, entrapment, consensual photophobia, or dendritic staining with fluorescein study.  Presentation non-concerning for iritis, bacterial conjunctivitis, corneal abrasions, or HSV.  Visual acuity was normal. No visual disturbance. Abx given due to history of purulent discharge.  Personal hygiene and frequent handwashing discussed.  Patient advised to followup with ophthalmologist if symptoms persist or worsen in any way including vision change. Pt is hemodynamically stable, in NAD, & able to ambulate in the ED. Pt has no complaints prior to dc. Pt is comfortable with above plan and is stable for discharge at this time. All questions were answered prior to disposition. Strict return precautions for f/u to the ED were discussed.     Final Clinical Impressions(s) / ED Diagnoses   Final diagnoses:  Conjunctivitis of right eye, unspecified conjunctivitis type  Trichomonal vaginitis  STD exposure    New Prescriptions Discharge Medication List as of 10/28/2016  5:44 PM    START taking these medications   Details  erythromycin ophthalmic ointment Place a 1/2 inch ribbon of ointment into the  lower eyelid of right eye 4 times per day for 5 days., Print    metroNIDAZOLE (FLAGYL) 500 MG tablet Take 1 tablet (500 mg total) by mouth 2 (two) times daily with a meal. DO NOT CONSUME ALCOHOL WHILE TAKING THIS MEDICATION., Starting Mon 10/28/2016, Print         Rise MuKenneth T Leaphart, PA-C 10/28/16 1801    Tilden FossaElizabeth Rees, MD 10/30/16 [phone]

## 2016-10-28 NOTE — Discharge Instructions (Signed)
Your exam is consistent with Trichomonas. Please take the Flagyl antibiotics 2 times a day for 7 days. If further results are positive you will be notified. Please return to the ED if he develops worsening symptoms including fever, vomiting, worsening abdominal pain, worsening vaginal pain. Please do not drink alcohol will taking the flagyl.  I'm also giving you an antibiotic ointment to apply to the right eye 4 times a day for 5 days. If your symptoms do not improve within 5-7 days follow-up with an ophthalmologist. Return to the Ed if your symptoms worsen. Try warm compresses.

## 2016-10-28 NOTE — ED Triage Notes (Signed)
Pt complains of right drainage and crusting over since this morning. Pt also complains of vaginal odor for the past 2 weeks. Pt states was called by a person she had unprotected sex with 2 months ago and was told they tested positive for chlamydia and gonorrhea. Pt states she has 3/10 pain in her pelvic area.

## 2016-10-29 LAB — GC/CHLAMYDIA PROBE AMP (~~LOC~~) NOT AT ARMC
CHLAMYDIA, DNA PROBE: POSITIVE — AB
Neisseria Gonorrhea: NEGATIVE

## 2016-10-29 LAB — URINE CULTURE

## 2016-10-29 LAB — RPR: RPR Ser Ql: NONREACTIVE

## 2016-10-29 LAB — HIV ANTIBODY (ROUTINE TESTING W REFLEX): HIV SCREEN 4TH GENERATION: NONREACTIVE

## 2017-05-05 ENCOUNTER — Encounter (HOSPITAL_COMMUNITY): Payer: Self-pay | Admitting: Emergency Medicine

## 2017-05-05 ENCOUNTER — Emergency Department (HOSPITAL_COMMUNITY)
Admission: EM | Admit: 2017-05-05 | Discharge: 2017-05-05 | Disposition: A | Discharge status: Home / Self Care | Payer: Medicaid Other | Attending: Emergency Medicine | Admitting: Emergency Medicine

## 2017-05-05 DIAGNOSIS — N760 Acute vaginitis: Secondary | ICD-10-CM | POA: Diagnosis not present

## 2017-05-05 DIAGNOSIS — F17210 Nicotine dependence, cigarettes, uncomplicated: Secondary | ICD-10-CM | POA: Diagnosis not present

## 2017-05-05 DIAGNOSIS — L03011 Cellulitis of right finger: Secondary | ICD-10-CM | POA: Diagnosis not present

## 2017-05-05 DIAGNOSIS — B9689 Other specified bacterial agents as the cause of diseases classified elsewhere: Secondary | ICD-10-CM | POA: Insufficient documentation

## 2017-05-05 DIAGNOSIS — M7989 Other specified soft tissue disorders: Secondary | ICD-10-CM | POA: Diagnosis present

## 2017-05-05 DIAGNOSIS — Z79899 Other long term (current) drug therapy: Secondary | ICD-10-CM | POA: Diagnosis not present

## 2017-05-05 MED ORDER — DOXYCYCLINE HYCLATE 100 MG PO CAPS: 100.0000 mg | ORAL_CAPSULE | Freq: Two times a day (BID) | ORAL | 0 refills | Status: DC

## 2017-05-05 MED ORDER — METRONIDAZOLE 500 MG PO TABS: 500.0000 mg | ORAL_TABLET | Freq: Two times a day (BID) | ORAL | 0 refills | Status: DC

## 2017-05-05 NOTE — ED Triage Notes (Signed)
Right ring finger with redness and swelling and pain around nail bed. Patient also adds that she thinks she has bacterial vaginitis and would like to get that checked.

## 2017-05-05 NOTE — ED Notes (Signed)
Pt verbalized understanding of d/c instructions and has no further questions. Pt is stable, A&Ox4, VSS.  

## 2017-05-05 NOTE — ED Notes (Signed)
Finger swelling around nail bed after cutting her nails

## 2017-05-05 NOTE — ED Provider Notes (Signed)
[facility]ER[facility] DEPT Provider Note   CSN: [id number] Arrival date & time: 05/05/17  1754   By signing my name below, I, Teofilo Pod, attest that this documentation has been prepared under the direction and in the presence of Langston Masker, [place]. Electronically Signed: Teofilo Pod, ED Scribe. 05/05/2017. 8:17 PM.   History   Chief Complaint Chief Complaint  Patient presents with  . Finger Injury    right ring finger parynchyma   The history is provided by the patient. No language interpreter was used.   HPI Comments:  Heather Kelly is a 36 y.o. female who presents to the Emergency Department complaining of ongoing swelling to the right ring finger for several days. Pt notes a red, swollen area to the nailbed of the finger. Pt also reports a concern for BV and would like to be treated and states that her symptoms are similar to a previous episode of BV. Pt denies any concern for STD. No alleviating factors noted. Pt denies other associated symptoms.     History reviewed. No pertinent past medical history.  There are no active problems to display for this patient.   Past Surgical History:  Procedure Laterality Date  . CESAREAN SECTION    . CESAREAN SECTION      OB History    No data available       Home Medications    Prior to Admission medications   Medication Sig Start Date End Date Taking? Authorizing Provider  erythromycin ophthalmic ointment Place a 1/2 inch ribbon of ointment into the lower eyelid of right eye 4 times per day for 5 days. 10/28/16   Rise Mu, PA-C  folic acid (FOLVITE) 0.5 MG tablet Take 0.5 mg by mouth daily.    [provider]  HYDROcodone-acetaminophen (NORCO) 5-325 MG tablet Take 1 tablet by mouth every 6 (six) hours as needed for moderate pain. Patient not taking: Reported on 10/28/2016 07/26/16   Ward, Chase Picket, PA-C  methocarbamol (ROBAXIN) 500 MG tablet Take 1 tablet (500 mg total) by mouth 2 (two) times  daily as needed for muscle spasms. Patient not taking: Reported on 10/28/2016 07/26/16   Ward, Chase Picket, PA-C  metroNIDAZOLE (FLAGYL) 500 MG tablet Take 1 tablet (500 mg total) by mouth 2 (two) times daily with a meal. DO NOT CONSUME ALCOHOL WHILE TAKING THIS MEDICATION. 10/28/16   Rise Mu, PA-C  Multiple Vitamins-Minerals (MULTIVITAMIN WITH MINERALS) tablet Take 1 tablet by mouth daily.    [provider]  naproxen (NAPROSYN) 500 MG tablet Take 1 tablet (500 mg total) by mouth 2 (two) times daily as needed. Patient not taking: Reported on 10/28/2016 07/26/16   Ward, Chase Picket, PA-C    Family History History reviewed. No pertinent family history.  Social History Social History  Substance Use Topics  . Smoking status: Current Every Day Smoker    Types: Cigarettes  . Smokeless tobacco: Never Used  . Alcohol use Yes     Allergies   Patient has no known allergies.   Review of Systems Review of Systems  Genitourinary: Positive for vaginal discharge.  Musculoskeletal: Positive for arthralgias.     Physical Exam Updated Vital Signs BP 126/63 (BP Location: Left Arm)   Pulse 70   Temp 99.5 F (37.5 C) (Oral)   Resp 19   Ht 5\' 9"  (1.753 m)   Wt 277 lb 7 oz (125.8 kg)   SpO2 100%   BMI 40.97 kg/m   Physical Exam  Constitutional: She appears well-developed and well-nourished. No distress.  HENT:  Head: Normocephalic and atraumatic.  Eyes: Conjunctivae are normal.  Cardiovascular: Normal rate.   Pulmonary/Chest: Effort normal.  Abdominal: She exhibits no distension.  Neurological: She is alert.  Skin: Skin is warm and dry.  Psychiatric: She has a normal mood and affect.  Nursing note and vitals reviewed.    ED Treatments / Results  DIAGNOSTIC STUDIES:  Oxygen Saturation is 100% on RA, normal by my interpretation.    COORDINATION OF CARE:  8:17 PM Discussed treatment plan with pt at bedside and pt agreed to plan.   Labs (all labs  ordered are listed, but only abnormal results are displayed) Labs Reviewed - No data to display  EKG  EKG Interpretation None       Radiology No results found.  Procedures Procedures (including critical care time)  Medications Ordered in ED Medications - No data to display   Initial Impression / Assessment and Plan / ED Course  I have reviewed the triage vital signs and the nursing notes.  Pertinent labs & imaging results that were available during my care of the patient were reviewed by me and considered in my medical decision making (see chart for details).       Final Clinical Impressions(s) / ED Diagnoses   Final diagnoses:  Paronychia of finger of right hand  BV (bacterial vaginosis)    New Prescriptions New Prescriptions   No medications on file   Meds ordered this encounter  Medications  . metroNIDAZOLE (FLAGYL) 500 MG tablet    Sig: Take 1 tablet (500 mg total) by mouth 2 (two) times daily.    Dispense:  14 tablet    Refill:  0    Order Specific Question:   Supervising Provider    Answer:   MILLER, BRIAN [[id number]]  . doxycycline (VIBRAMYCIN) 100 MG capsule    Sig: Take 1 capsule (100 mg total) by mouth 2 (two) times daily.    Dispense:  20 capsule    Refill:  0    Order Specific Question:   Supervising Provider    Answer:   Eber HongMILLER, BRIAN [[id number]]   An After Visit Summary was printed and given to the patient.  I personally performed the services in this documentation, which was scribed in my presence.  The recorded information has been reviewed and considered.   Barnet PallKaren SofiaPAC.    Osie CheeksSofia, Leslie K, PA-C 05/06/17 0134    Benjiman CorePickering, Nathan, MD 05/07/17 2100

## 2018-01-12 ENCOUNTER — Other Ambulatory Visit: Payer: Self-pay | Admitting: Internal Medicine

## 2018-01-12 DIAGNOSIS — N926 Irregular menstruation, unspecified: Secondary | ICD-10-CM

## 2018-01-14 ENCOUNTER — Ambulatory Visit
Admission: RE | Admit: 2018-01-14 | Discharge: 2018-01-14 | Disposition: A | Discharge status: Home / Self Care | Payer: Self-pay | Source: Ambulatory Visit | Attending: Internal Medicine | Admitting: Internal Medicine

## 2018-01-14 DIAGNOSIS — N926 Irregular menstruation, unspecified: Secondary | ICD-10-CM

## 2018-03-03 ENCOUNTER — Ambulatory Visit (HOSPITAL_COMMUNITY)
Admission: EM | Admit: 2018-03-03 | Discharge: 2018-03-03 | Disposition: A | Discharge status: Home / Self Care | Payer: Medicaid Other | Attending: Family Medicine | Admitting: Family Medicine

## 2018-03-03 ENCOUNTER — Encounter (HOSPITAL_COMMUNITY): Payer: Self-pay | Admitting: Emergency Medicine

## 2018-03-03 DIAGNOSIS — J029 Acute pharyngitis, unspecified: Secondary | ICD-10-CM | POA: Diagnosis not present

## 2018-03-03 LAB — POCT RAPID STREP A: Streptococcus, Group A Screen (Direct): NEGATIVE

## 2018-03-03 MED ORDER — CETIRIZINE-PSEUDOEPHEDRINE ER 5-120 MG PO TB12: 1.0000 | ORAL_TABLET | Freq: Every day | ORAL | 0 refills | Status: DC

## 2018-03-03 MED ORDER — IPRATROPIUM BROMIDE 0.06 % NA SOLN: 2.0000 | Freq: Four times a day (QID) | NASAL | 0 refills | Status: DC

## 2018-03-03 MED ORDER — FLUTICASONE PROPIONATE 50 MCG/ACT NA SUSP: 2.0000 | Freq: Every day | NASAL | 0 refills | Status: DC

## 2018-03-03 NOTE — Discharge Instructions (Signed)
Rapid strep negative. Symptoms are most likely due to viral illness. Flonase, Atrovent, Zyrtec-D for nasal congestion/drainage. You can use over the counter nasal saline rinse such as neti pot for nasal congestion. Monitor for any worsening of symptoms, swelling of the throat, trouble breathing, trouble swallowing, follow up for reevaluation.   For sore throat try using a honey-based tea. Use 3 teaspoons of honey with juice squeezed from half lemon. Place shaved pieces of ginger into 1/2-1 cup of water and warm over stove top. Then mix the ingredients and repeat every 4 hours as needed.

## 2018-03-03 NOTE — ED Triage Notes (Signed)
Pt sts sore throat; pt sts had recent hx of strep

## 2018-03-03 NOTE — ED Provider Notes (Signed)
[facility]    CSN: [id number] Arrival date & time: 03/03/18  1825     History   Chief Complaint Chief Complaint  Patient presents with  . Sore Throat    HPI Heather Kelly is a 37 y.o. female.   36 year old female comes in for 1 day history of sore throat.  States it feels like strep.  She is also had a few week history of postnasal drip.  Denies rhinorrhea, nasal congestion.  Has intermittent productive cough due to postnasal drip.  Denies fever, chills, night sweats.  Has not taken anything for the symptoms.  Current every day smoker.      History reviewed. No pertinent past medical history.  There are no active problems to display for this patient.   Past Surgical History:  Procedure Laterality Date  . CESAREAN SECTION    . CESAREAN SECTION      OB History    No data available       Home Medications    Prior to Admission medications   Medication Sig Start Date End Date Taking? Authorizing Provider  cetirizine-pseudoephedrine (ZYRTEC-D) 5-120 MG tablet Take 1 tablet by mouth daily. 03/03/18   Cathie Hoops, Mosi Hannold V, PA-C  doxycycline (VIBRAMYCIN) 100 MG capsule Take 1 capsule (100 mg total) by mouth 2 (two) times daily. 05/05/17   Elson Areas, PA-C  erythromycin ophthalmic ointment Place a 1/2 inch ribbon of ointment into the lower eyelid of right eye 4 times per day for 5 days. 10/28/16   Rise Mu, PA-C  fluticasone (FLONASE) 50 MCG/ACT nasal spray Place 2 sprays into both nostrils daily. 03/03/18   Cathie Hoops, Jessup Ogas V, PA-C  folic acid (FOLVITE) 0.5 MG tablet Take 0.5 mg by mouth daily.    [provider]  HYDROcodone-acetaminophen (NORCO) 5-325 MG tablet Take 1 tablet by mouth every 6 (six) hours as needed for moderate pain. Patient not taking: Reported on 10/28/2016 07/26/16   Ward, Chase Picket, PA-C  ipratropium (ATROVENT) 0.06 % nasal spray Place 2 sprays into both nostrils 4 (four) times daily. 03/03/18   Cathie Hoops, Quadre Bristol V, PA-C  methocarbamol (ROBAXIN)  500 MG tablet Take 1 tablet (500 mg total) by mouth 2 (two) times daily as needed for muscle spasms. Patient not taking: Reported on 10/28/2016 07/26/16   Ward, Chase Picket, PA-C  metroNIDAZOLE (FLAGYL) 500 MG tablet Take 1 tablet (500 mg total) by mouth 2 (two) times daily. 05/05/17   Elson Areas, PA-C  Multiple Vitamins-Minerals (MULTIVITAMIN WITH MINERALS) tablet Take 1 tablet by mouth daily.    [provider]  naproxen (NAPROSYN) 500 MG tablet Take 1 tablet (500 mg total) by mouth 2 (two) times daily as needed. Patient not taking: Reported on 10/28/2016 07/26/16   Ward, Chase Picket, PA-C    Family History History reviewed. No pertinent family history.  Social History Social History   Tobacco Use  . Smoking status: Current Every Day Smoker    Types: Cigarettes  . Smokeless tobacco: Never Used  Substance Use Topics  . Alcohol use: Yes  . Drug use: No     Allergies   Patient has no known allergies.   Review of Systems Review of Systems  Reason unable to perform ROS: See HPI as above.     Physical Exam Triage Vital Signs ED Triage Vitals [03/03/18 1906]  Enc Vitals Group     BP 140/76     Pulse Rate 70     Resp 18  Temp 98.3 F (36.8 C)     Temp Source Oral     SpO2 97 %     Weight      Height      Head Circumference      Peak Flow      Pain Score 7     Pain Loc      Pain Edu?      Excl. in GC?    No data found.  Updated Vital Signs BP 140/76 (BP Location: Left Arm)   Pulse 70   Temp 98.3 F (36.8 C) (Oral)   Resp 18   SpO2 97%   Physical Exam  Constitutional: She is oriented to person, place, and time. She appears well-developed and well-nourished. No distress.  HENT:  Head: Normocephalic and atraumatic.  Right Ear: External ear and ear canal normal. Tympanic membrane is erythematous. Tympanic membrane is not bulging.  Left Ear: External ear and ear canal normal. Tympanic membrane is erythematous. Tympanic membrane is not  bulging.  Nose: Mucosal edema present. Right sinus exhibits no maxillary sinus tenderness and no frontal sinus tenderness. Left sinus exhibits no maxillary sinus tenderness and no frontal sinus tenderness.  Mouth/Throat: Uvula is midline and mucous membranes are normal. Posterior oropharyngeal erythema present. No tonsillar exudate.  Eyes: Conjunctivae are normal. Pupils are equal, round, and reactive to light.  Neck: Normal range of motion. Neck supple.  Cardiovascular: Normal rate, regular rhythm and normal heart sounds. Exam reveals no gallop and no friction rub.  No murmur heard. Pulmonary/Chest: Effort normal and breath sounds normal. She has no decreased breath sounds. She has no wheezes. She has no rhonchi. She has no rales.  Lymphadenopathy:    She has no cervical adenopathy.  Neurological: She is alert and oriented to person, place, and time.  Skin: Skin is warm and dry.  Psychiatric: She has a normal mood and affect. Her behavior is normal. Judgment normal.     UC Treatments / Results  Labs (all labs ordered are listed, but only abnormal results are displayed) Labs Reviewed  CULTURE, GROUP A STREP [facility])  POCT RAPID STREP A    EKG  EKG Interpretation None       Radiology No results found.  Procedures Procedures (including critical care time)  Medications Ordered in UC Medications - No data to display   Initial Impression / Assessment and Plan / UC Course  I have reviewed the triage vital signs and the nursing notes.  Pertinent labs & imaging results that were available during my care of the patient were reviewed by me and considered in my medical decision making (see chart for details).    Rapid strep negative. Patient is nontoxic in appearance. Symptomatic treatment as needed. Return precautions given.   Final Clinical Impressions(s) / UC Diagnoses   Final diagnoses:  Acute pharyngitis, unspecified etiology    ED Discharge Orders        Ordered     fluticasone (FLONASE) 50 MCG/ACT nasal spray  Daily     03/03/18 1947    ipratropium (ATROVENT) 0.06 % nasal spray  4 times daily     03/03/18 1947    cetirizine-pseudoephedrine (ZYRTEC-D) 5-120 MG tablet  Daily     03/03/18 1947        Belinda FisherYu, Kingslee Dowse V, PA-C 03/03/18 1949

## 2018-03-06 LAB — CULTURE, GROUP A STREP (THRC)

## 2018-08-08 ENCOUNTER — Other Ambulatory Visit: Payer: Self-pay

## 2018-08-08 ENCOUNTER — Emergency Department (HOSPITAL_COMMUNITY)
Admission: EM | Admit: 2018-08-08 | Discharge: 2018-08-08 | Disposition: A | Discharge status: Home / Self Care | Payer: Medicaid Other | Attending: Emergency Medicine | Admitting: Emergency Medicine

## 2018-08-08 DIAGNOSIS — S0591XA Unspecified injury of right eye and orbit, initial encounter: Secondary | ICD-10-CM

## 2018-08-08 DIAGNOSIS — Y929 Unspecified place or not applicable: Secondary | ICD-10-CM | POA: Diagnosis not present

## 2018-08-08 DIAGNOSIS — W51XXXA Accidental striking against or bumped into by another person, initial encounter: Secondary | ICD-10-CM | POA: Diagnosis not present

## 2018-08-08 DIAGNOSIS — Y999 Unspecified external cause status: Secondary | ICD-10-CM | POA: Insufficient documentation

## 2018-08-08 DIAGNOSIS — F17210 Nicotine dependence, cigarettes, uncomplicated: Secondary | ICD-10-CM | POA: Insufficient documentation

## 2018-08-08 DIAGNOSIS — Y9389 Activity, other specified: Secondary | ICD-10-CM | POA: Diagnosis not present

## 2018-08-08 MED ORDER — FLUORESCEIN SODIUM 1 MG OP STRP
1.0000 | ORAL_STRIP | Freq: Once | OPHTHALMIC | Status: AC
  Administered 2018-08-08: 1 via OPHTHALMIC
  Filled 2018-08-08: qty 1

## 2018-08-08 MED ORDER — TETRACAINE HCL 0.5 % OP SOLN
2.0000 [drp] | Freq: Once | OPHTHALMIC | Status: AC
  Administered 2018-08-08: 2 [drp] via OPHTHALMIC
  Filled 2018-08-08: qty 4

## 2018-08-08 MED ORDER — ERYTHROMYCIN 5 MG/GM OP OINT
1.0000 | TOPICAL_OINTMENT | Freq: Once | OPHTHALMIC | Status: AC
Start: 2018-08-08 — End: 2018-08-08
  Administered 2018-08-08: 1 via OPHTHALMIC
  Filled 2018-08-08: qty 3.5

## 2018-08-08 NOTE — Discharge Instructions (Signed)
You were seen here today for eye pain after an injury. Your exam was reassuring. Please use erythromycin eye ointment every 6 hours over the next 5-7 days Follow up with the eye doctor as needed.  Return to the ED if  You have severe eye pain that does not get better with medicine. You have vision loss. If you develop worsening or new concerning symptoms you can return to the emergency department for re-evaluation.

## 2018-08-08 NOTE — ED Provider Notes (Signed)
[facility] EMERGENCY DEPARTMENT Provider Note   CSN: [id number] Arrival date & time: 08/08/18  0944     History   Chief Complaint Chief Complaint  Patient presents with  . Eye Injury    HPI Amrit L Umland is a 37 y.o. female with no significant past medical history who presents emergency department today for right eye injury.  Patient reports that she was with her daughter earlier this morning around 4:30 AM when she picked up a Chapstick and actually poked the patient in the right eye.  She reports that it missed the eye itself but hit the eyelid below.  She reports since that time she has been having redness, itching, burning and irritation of the lower eyelid and eye itself.  She denies any visual changes, drainage, photophobia, difficulty with extraocular movements.  She has not taken anything for symptoms.  She reports that nothing makes her symptoms better or worse.  She does wear glasses but does not wear contacts.  HPI  No past medical history on file.  There are no active problems to display for this patient.   Past Surgical History:  Procedure Laterality Date  . CESAREAN SECTION    . CESAREAN SECTION       OB History   None      Home Medications    Prior to Admission medications   Medication Sig Start Date End Date Taking? Authorizing Provider  cetirizine-pseudoephedrine (ZYRTEC-D) 5-120 MG tablet Take 1 tablet by mouth daily. 03/03/18   Cathie Hoops, Amy V, PA-C  doxycycline (VIBRAMYCIN) 100 MG capsule Take 1 capsule (100 mg total) by mouth 2 (two) times daily. 05/05/17   Elson Areas, PA-C  erythromycin ophthalmic ointment Place a 1/2 inch ribbon of ointment into the lower eyelid of right eye 4 times per day for 5 days. 10/28/16   Rise Mu, PA-C  fluticasone (FLONASE) 50 MCG/ACT nasal spray Place 2 sprays into both nostrils daily. 03/03/18   Cathie Hoops, Amy V, PA-C  folic acid (FOLVITE) 0.5 MG tablet Take 0.5 mg by mouth daily.    [provider]  HYDROcodone-acetaminophen (NORCO) 5-325 MG tablet Take 1 tablet by mouth every 6 (six) hours as needed for moderate pain. Patient not taking: Reported on 10/28/2016 07/26/16   Ward, Chase Picket, PA-C  ipratropium (ATROVENT) 0.06 % nasal spray Place 2 sprays into both nostrils 4 (four) times daily. 03/03/18   Cathie Hoops, Amy V, PA-C  methocarbamol (ROBAXIN) 500 MG tablet Take 1 tablet (500 mg total) by mouth 2 (two) times daily as needed for muscle spasms. Patient not taking: Reported on 10/28/2016 07/26/16   Ward, Chase Picket, PA-C  metroNIDAZOLE (FLAGYL) 500 MG tablet Take 1 tablet (500 mg total) by mouth 2 (two) times daily. 05/05/17   Elson Areas, PA-C  Multiple Vitamins-Minerals (MULTIVITAMIN WITH MINERALS) tablet Take 1 tablet by mouth daily.    [provider]  naproxen (NAPROSYN) 500 MG tablet Take 1 tablet (500 mg total) by mouth 2 (two) times daily as needed. Patient not taking: Reported on 10/28/2016 07/26/16   Ward, Chase Picket, PA-C    Family History No family history on file.  Social History Social History   Tobacco Use  . Smoking status: Current Every Day Smoker    Types: Cigarettes  . Smokeless tobacco: Never Used  Substance Use Topics  . Alcohol use: Yes  . Drug use: No     Allergies   Patient has no known allergies.  Review of Systems Review of Systems  Constitutional: Negative for fever.  Eyes: Positive for redness and itching. Negative for photophobia, pain, discharge and visual disturbance.  Gastrointestinal: Negative for nausea and vomiting.  Musculoskeletal: Negative for neck pain.  Skin: Negative for rash and wound.  Neurological: Negative for syncope, weakness and headaches.  All other systems reviewed and are negative.    Physical Exam Updated Vital Signs BP 92/60 (BP Location: Right Arm)   Pulse 68   Temp 97.7 F (36.5 C) (Oral)   Resp 20   Ht 5' 9.75" (1.772 m)   Wt 136.1 kg   LMP 07/14/2018   SpO2 100%   BMI  43.35 kg/m   Physical Exam  Constitutional: She appears well-developed and well-nourished.  HENT:  Head: Normocephalic and atraumatic.  Right Ear: External ear normal.  Left Ear: External ear normal.  Eyes: Conjunctivae are normal. Right eye exhibits no discharge. Left eye exhibits no discharge. No scleral icterus.  Appearance (right eye). There is lower conjunctival injection.  No chemosis, discharge, subconjunctival hemorrhage.  PEERL intact. EOMI without nystagmus or pain. No photophobia or consensual photophobia.  Corneal Abrasion Exam VCO. Risks, benefits and alternatives explained. 1 drops of tetracaine (PONTOCAINE) 0.5 % ophthalmic solution  were applied to the right eye. Fluorescein 1 MG ophthalmic strip applied the the surface of the right eye Wood's lamp used to screen for abrasion. No increased fluorescein uptake. No corneal ulcer. Negative Seidel sign. No foreign bodies noted. No visible hyphema.  Eye flushed with sterile saline Patient tolerated the procedure well TONOPEN: 25.60 RIGHT  Pulmonary/Chest: Effort normal. No respiratory distress.  Neurological: She is alert.  Skin: No pallor.  Psychiatric: She has a normal mood and affect.  Nursing note and vitals reviewed.    ED Treatments / Results  Labs (all labs ordered are listed, but only abnormal results are displayed) Labs Reviewed - No data to display  EKG None  Radiology No results found.  Procedures Procedures (including critical care time)  Medications Ordered in ED Medications  erythromycin ophthalmic ointment 1 application (has no administration in time range)  fluorescein ophthalmic strip 1 strip (1 strip Both Eyes Given 08/08/18 1016)  tetracaine (PONTOCAINE) 0.5 % ophthalmic solution 2 drop (2 drops Both Eyes Given 08/08/18 1016)     Initial Impression / Assessment and Plan / ED Course  I have reviewed the triage vital signs and the nursing notes.  Pertinent labs & imaging results that were  available during my care of the patient were reviewed by me and considered in my medical decision making (see chart for details).     Patient presents with eye pain after being poked with chopsticks.  She is not a contact lens wearer.  There is no evidence of corneal abrasion, corneal ulcer, hyphema, hypopyon, orbital rupture, sub-conjunctival hemorrhage on exam.  Patient with normal vision.  Will discharge home with erythromycin ointment that was given the department.  She is to follow-up with ophthalmology.  Return precautions discussed.  Patient appears safe for discharge.  Final Clinical Impressions(s) / ED Diagnoses   Final diagnoses:  Right eye injury, initial encounter    ED Discharge Orders    None       Princella PellegriniMaczis, Vincenta Steffey M, PA-C 08/08/18 1140    Sabas SousBero, Ewald Beg M, MD 08/08/18 1700

## 2018-08-08 NOTE — ED Triage Notes (Signed)
Pt. Stated, I was changing my daughter's clothes and she picked up a chop sick and as I turned it poked me in the right eye.

## 2018-08-08 NOTE — ED Notes (Signed)
Declined W/C at D/C and was escorted to lobby by RN. 

## 2019-02-17 ENCOUNTER — Emergency Department: Payer: Medicaid - Out of State

## 2019-02-17 ENCOUNTER — Emergency Department
Admission: EM | Admit: 2019-02-17 | Discharge: 2019-02-17 | Disposition: A | Discharge status: Home / Self Care | Payer: Medicaid - Out of State | Attending: Emergency Medicine | Admitting: Emergency Medicine

## 2019-02-17 DIAGNOSIS — W19XXXA Unspecified fall, initial encounter: Secondary | ICD-10-CM

## 2019-02-17 DIAGNOSIS — W010XXA Fall on same level from slipping, tripping and stumbling without subsequent striking against object, initial encounter: Secondary | ICD-10-CM | POA: Insufficient documentation

## 2019-02-17 DIAGNOSIS — S300XXA Contusion of lower back and pelvis, initial encounter: Secondary | ICD-10-CM | POA: Insufficient documentation

## 2019-02-17 LAB — URINE HCG QUALITATIVE: Urine HCG Qualitative: NEGATIVE

## 2019-02-17 MED ORDER — KETOROLAC TROMETHAMINE 10 MG PO TABS
ORAL_TABLET | ORAL | Status: AC
Start: 2019-02-17 — End: ?
  Filled 2019-02-17: qty 1

## 2019-02-17 MED ORDER — KETOROLAC TROMETHAMINE 10 MG PO TABS
10.00 mg | ORAL_TABLET | Freq: Three times a day (TID) | ORAL | 0 refills | Status: AC | PRN
Start: 2019-02-17 — End: ?

## 2019-02-17 MED ORDER — KETOROLAC TROMETHAMINE 10 MG PO TABS
10.00 mg | ORAL_TABLET | Freq: Once | ORAL | Status: AC
Start: 2019-02-17 — End: 2019-02-17
  Administered 2019-02-17: 12:00:00 10 mg via ORAL

## 2019-02-17 NOTE — ED Provider Notes (Signed)
[facility] EMERGENCY DEPARTMENT History and Physical Exam      Patient Name: Julie Lindsey, Julie Lindsey  Encounter Date:  02/17/2019  Attending Physician: Veverly Fells. Jonessa Triplett M.D.  PCP: No primary care provider on file.  Patient DOB:  1981/08/21  MRN:  [id number]  Room:  RAUG/RAU-G      History of Presenting Illness     Chief complaint: Back Pain    HPI/ROS is limited by: none  HPI/ROS given by: patient    Location: low back   Duration: this morning  Severity: moderate    Julie Lindsey is a 37 y.o. female who presents with low back pain after a fall.  The patient states that she tripped and fell backwards.  She landed on her low back on a concrete floor.  She complains of pain in the midline low back area.  She denies any head injury or neck pain.  She denies any numbness or tingling to her legs.  She denies any radiation of pain to her legs.  She has no other complaints.  She denies other injury.      Review of Systems     Review of Systems   Constitutional: Negative for chills and fever.   HENT: Negative for congestion and sore throat.    Eyes: Negative.    Respiratory: Negative for cough and shortness of breath.    Cardiovascular: Negative for chest pain.   Gastrointestinal: Negative for abdominal pain, diarrhea and vomiting.   Musculoskeletal: Positive for back pain. Negative for neck pain.   Skin: Negative for rash.   Neurological: Negative for sensory change, loss of consciousness and headaches.         Allergies     Pt has No Known Allergies.      Medications       Current Facility-Administered Medications:     ketorolac (TORADOL) tablet 10 mg, 10 mg, Oral, Once, Myna Bright, MD    Current Outpatient Medications:     ketorolac (TORADOL) 10 MG tablet, Take 1 tablet (10 mg total) by mouth 3 (three) times daily as needed for Pain, Disp: 15 tablet, Rfl: 0       Past Medical History     Pt has no past medical history on file.      Past Surgical History     Pt has no past surgical history on file.      Family History     The  family history is not on file.      Social History     Pt reports that she has never smoked. She has never used smokeless tobacco. She reports that she does not drink alcohol or use drugs.      Physical Exam     Blood pressure 118/73, pulse 85, temperature 98.1 F (36.7 C), resp. rate 16, height 1.753 m, weight 139 kg, SpO2 100 %.    Physical Exam   Constitutional: She is oriented to person, place, and time and well-developed, well-nourished, and in no distress.   HENT:   Head: Head is without abrasion and without contusion.   Eyes: Conjunctivae and EOM are normal.   Neck: Normal range of motion. Neck supple. No spinous process tenderness present.   Cardiovascular: Normal rate, regular rhythm and normal heart sounds.   Pulmonary/Chest: Effort normal and breath sounds normal. No respiratory distress.   Abdominal: Soft. There is no abdominal tenderness.   Musculoskeletal:      Lumbar back: She exhibits decreased range of motion,  tenderness and bony tenderness.   Neurological: She is alert and oriented to person, place, and time. She has a normal Straight Leg Raise Test.   Reflex Scores:       Patellar reflexes are 2+ on the right side and 2+ on the left side.  The patient is able to plantar dorsiflex both feet without difficulty.  She has sensation intact light touch in both lower extremities.   Skin: Skin is warm and dry.   Psychiatric: Affect and judgment normal.   Nursing note and vitals reviewed.    Orders Placed     Orders Placed This Encounter   Procedures    XR Lumbar Spine AP And Lateral    HCG, Qualitative, Urine       Diagnostic Results       The results of the diagnostic studies below have been reviewed by myself:    Labs  Results     Procedure Component Value Units Date/Time    HCG, Qualitative, Urine [[id number]] Collected:  02/17/19 1025    Specimen:  Urine, Random Updated:  02/17/19 1046     human chorionic gonadotropin (hCG), UR, Qual. Negative          Radiologic Studies  Radiology Results (24 Hour)      Procedure Component Value Units Date/Time    XR Lumbar Spine AP And Lateral [[id number]] Collected:  02/17/19 1131    Order Status:  Completed Updated:  02/17/19 1134    Narrative:       Clinical History:  Fall, low back pain  Patient states ground level fall today, pain in mid lower back.      Examination:  AP and lateral views of the lumbar spine.    Comparison:  None available.    Findings:  Lumbarization of S1; given this, 5 nonrib-bearing lumbar-type vertebral bodies.  No acute fracture or listhesis.  Vertebral bodies normal height and alignment.  Disc spaces maintained.  No significant degenerative changes.  Sacrum intact.  SI joints congruent.      Impression:       No acute findings.    ReadingStation:[id number]            ED Course     The patient remained stable during her course in the emergency room.  X-rays of her lumbar spine showed no fracture.  I suspect likely lumbar contusion after fall.  There is no neurologic deficit.      MDM / Critical Care     Blood pressure 118/73, pulse 85, temperature 98.1 F (36.7 C), resp. rate 16, height 1.753 m, weight 139 kg, SpO2 100 %.    This patient presents with back/neck pain that seems musculoskeletal in origin. Based on my history and examination, several diagnoses including cauda equina syndrome, infection, AAA, kidney stones, have been considered and are unlikely. No life-threatening etiologies of back pain are discernible at this time, and the patient's symptoms will be managed with symptomatic care. I advised the patient to return to the emergency department immediately should they develop worsening pain, fever, weakness, bowel or bladder symptoms, or any acute concerns .  The patient was deemed well enough for discharge.  Diagnostic impression and plan were discussed with the patient and/or family.  If ordered, results of lab/radiology tests were discussed with the patient and/or family. Questions were answered and concerns were addressed.  The patient  was encouraged to follow-up with their primary care provider or specialist if not improved.      Procedures  Diagnosis / Disposition     Clinical Impression  1. Lumbar contusion, initial encounter    2. Fall, initial encounter        Disposition  ED Disposition     ED Disposition Condition Date/Time Comment    Discharge  Wed Feb 17, 2019 11:41 AM Thre[facility]illiant discharge to home/self care.    Condition at disposition: Stable          Prescriptions  New Prescriptions    KETOROLAC (TORADOL) 10 MG TABLET    Take 1 tablet (10 mg total) by mouth 3 (three) times daily as needed for Pain         In addition to the above history, please see nursing notes.  Allergies, meds, past medical, family, social, history and the results from the diagnostic studies performed have been reviewed by myself.     This note has been created by an Electronic Medical Record that may contain additions or subtractions not intended by myself.  Myna Bright, MD     Myna Bright, MD  02/17/19 [phone]

## 2019-02-17 NOTE — ED Notes (Signed)
Pt ambulated to restroom w/o assistance to provide urine specimen.

## 2019-02-17 NOTE — Discharge Instructions (Signed)
Back Contusion  You have a contusion to your back. A contusion is also called a bruise. There is swelling and some bleeding under the skin. The skin may be purplish. You may have muscle aching and stiffness in the area of the bruise. There are no broken bones.  Contusions heal on their own, without further treatment. However, pain and skin discoloration may take weeks to months to go away.  Home care   Rest. Avoid heavy lifting, strenuous exertion, or any activity that causes pain.   Ice the area to reduce pain and swelling. Put ice cubes in a plastic bag or use a cold pack. (Wrap the cold source in a thin towel. Don't place it directly on your skin.) Ice the injured area for 20 minutes every 1 to 2 hours the first day. Continue with ice packs 3 to 4 times a day for the next 2 days, then as needed for the relief of pain and swelling.   Take any prescribed pain medicine. If none was prescribed, take acetaminophen,ibuprofen,or naproxento control pain, unless you have other medical conditions that prevent taking these medicines. If you are unsure about medicines, ask your healthcare provider before you leave the hospital.  Follow-up care  Follow up with your healthcare provider, or as directed. Call if you are not better in 1 to 2 weeks.  When to seek medical advice  Call your healthcare provider for any of the following:   New or worsening pain   Increased swelling around the bruise   Pain spreads to one or both legs   Weakness or numbness in one or both legs   Loss of bowel or bladder control   Numbness in the groin or genital area   Fever of 100.4F (38C) or higher, or as directed by your healthcare provider  [employer] last reviewed this educational content on 06/15/2016   2000-2020 [employer] [address], [place] [address]. All rights reserved. This information is not intended as a substitute for professional medical care. Always follow your healthcare professional's  instructions.

## 2019-10-13 ENCOUNTER — Other Ambulatory Visit (HOSPITAL_COMMUNITY): Payer: Self-pay | Admitting: Physician Assistant

## 2019-10-13 DIAGNOSIS — N941 Unspecified dyspareunia: Secondary | ICD-10-CM

## 2019-10-14 ENCOUNTER — Telehealth: Payer: Self-pay | Admitting: Obstetrics and Gynecology

## 2019-10-14 NOTE — Telephone Encounter (Signed)
The patient has a referral

## 2019-10-20 ENCOUNTER — Ambulatory Visit (HOSPITAL_COMMUNITY): Payer: Medicaid Other

## 2019-10-20 ENCOUNTER — Encounter (HOSPITAL_COMMUNITY): Payer: Self-pay

## 2019-10-20 ENCOUNTER — Ambulatory Visit (HOSPITAL_COMMUNITY)
Admission: RE | Admit: 2019-10-20 | Discharge: 2019-10-20 | Disposition: A | Discharge status: Home / Self Care | Payer: Medicaid Other | Source: Ambulatory Visit | Attending: Physician Assistant | Admitting: Physician Assistant

## 2019-10-20 ENCOUNTER — Other Ambulatory Visit: Payer: Self-pay

## 2019-10-20 DIAGNOSIS — N941 Unspecified dyspareunia: Secondary | ICD-10-CM | POA: Diagnosis not present

## 2020-04-03 IMAGING — US US PELVIS COMPLETE WITH TRANSVAGINAL
1 series · 13 of 25 positions shown · non-contrast
Comparison: Pelvic ultrasound 01/14/2018

CLINICAL DATA: Dyspareunia

EXAM:
TRANSABDOMINAL AND TRANSVAGINAL ULTRASOUND OF PELVIS
TECHNIQUE: Both transabdominal and transvaginal ultrasound examinations of the
pelvis were performed. Transabdominal technique was performed for
global imaging of the pelvis including uterus, ovaries, adnexal
regions, and pelvic cul-de-sac. It was necessary to proceed with
endovaginal exam following the transabdominal exam to visualize the
uterus endometrium ovaries.

[Series 1: us pelvis complete with transvaginal · 92 acquisitions, 13 frames shown]
[im 1/92]
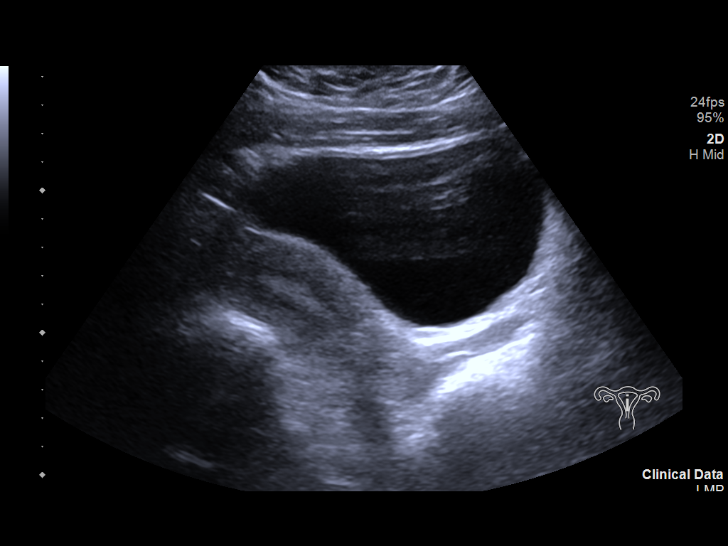
[im 8/92]
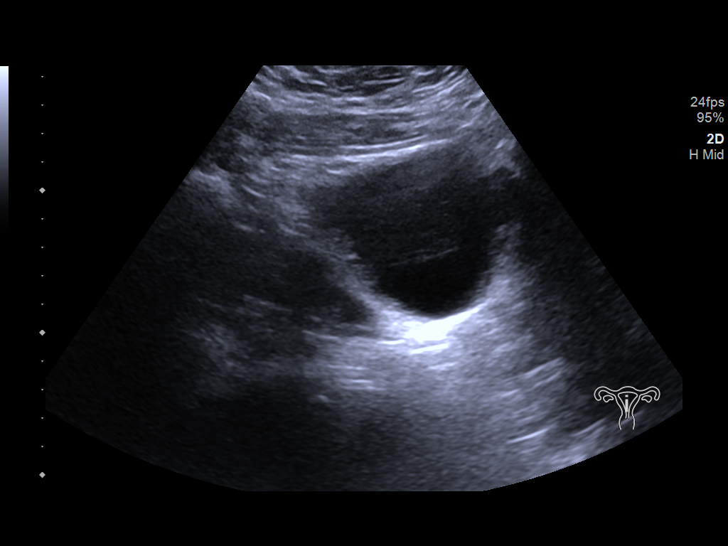
[im 16/92]
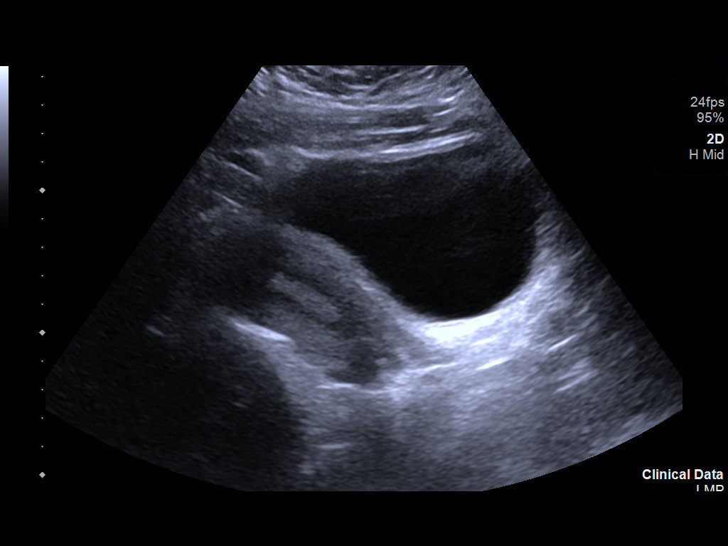
[im 23/92]
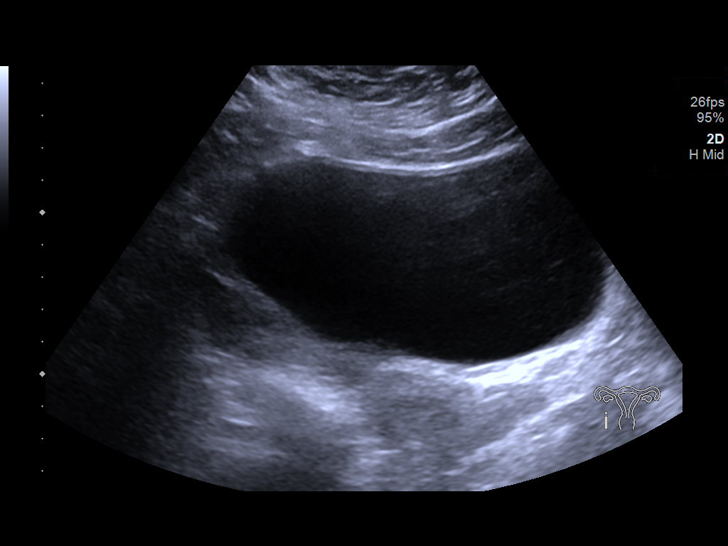
[im 31/92]
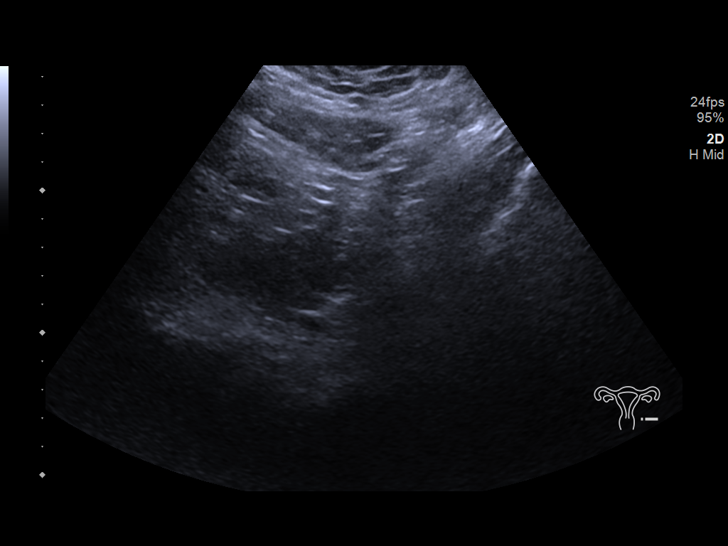
[im 38/92]
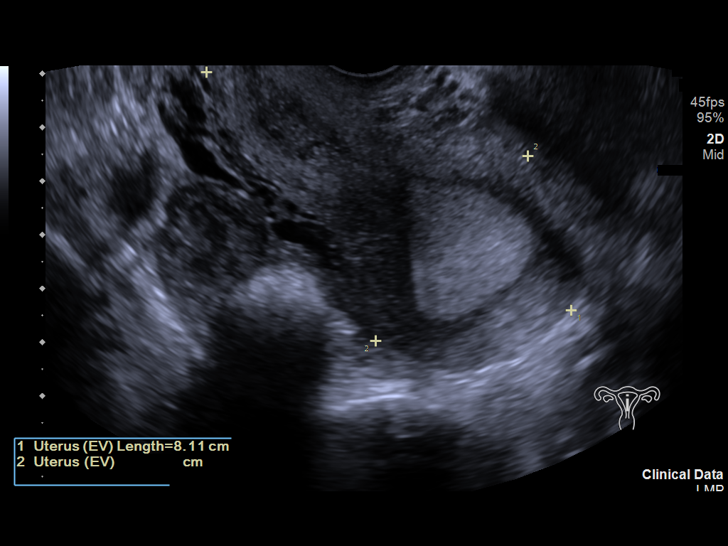
[im 46/92]
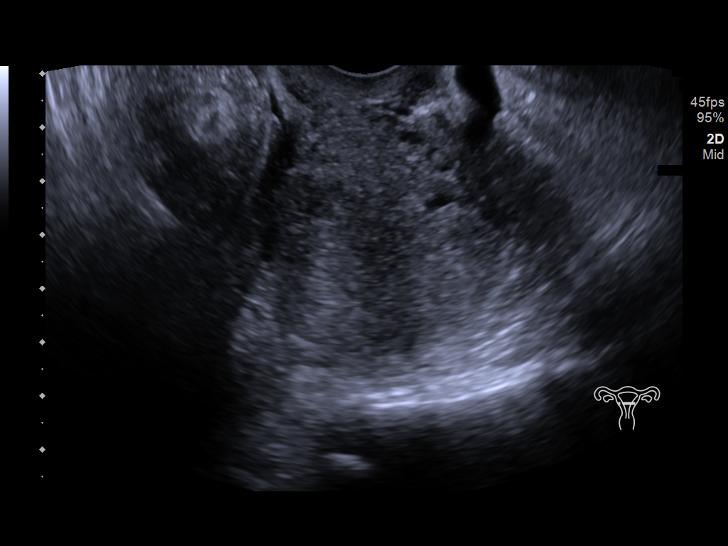
[im 54/92]
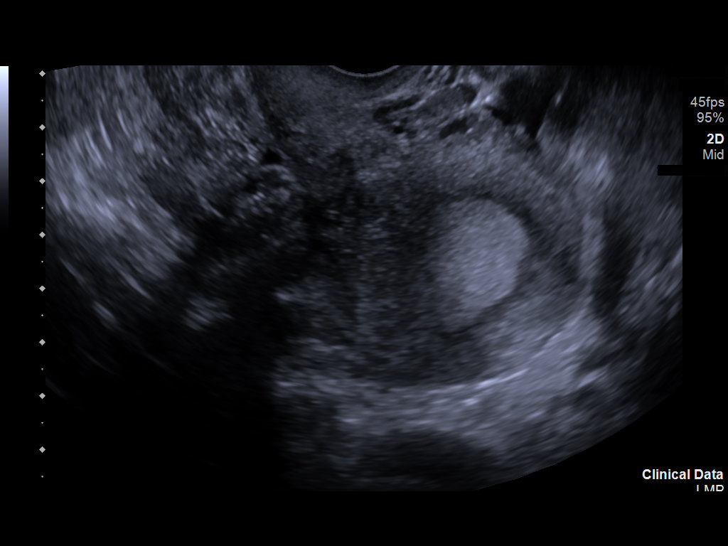
[im 61/92]
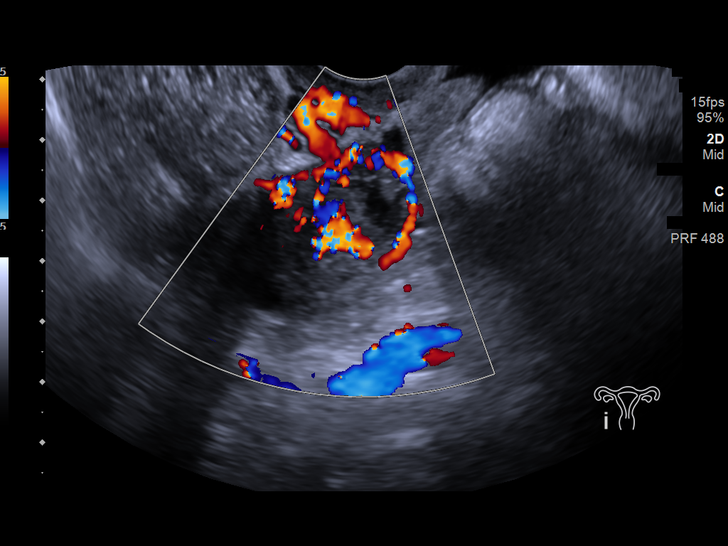
[im 69/92]
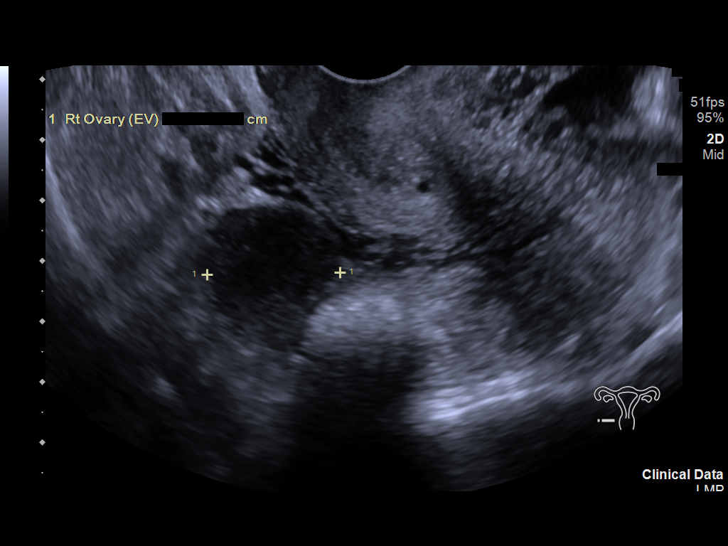
[im 76/92]
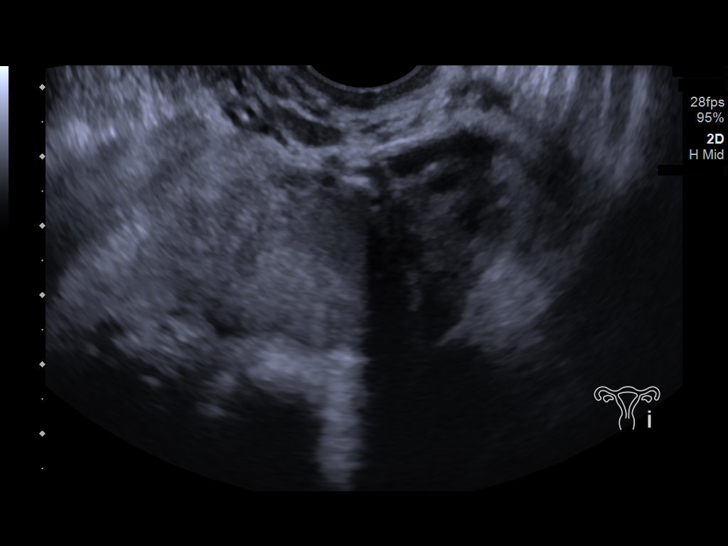
[im 84/92]
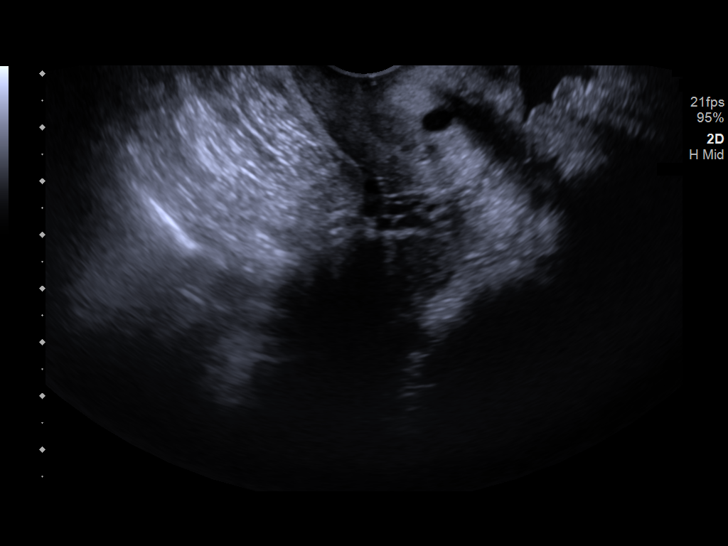
[im 92/92]
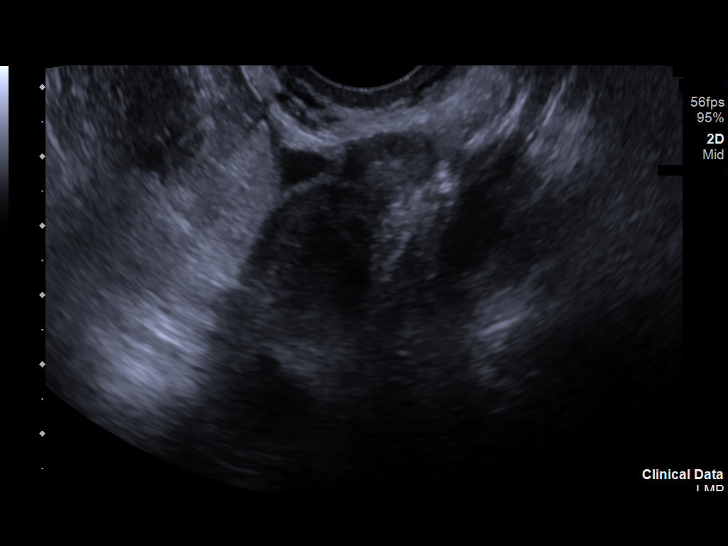

[13 of 25 positions shown; findings below may reference images not displayed]

FINDINGS: Uterus

Measurements: 8.5 x 3.1 x 5.3 cm = volume: 72.9 mL. No fibroids or
other mass visualized.

Endometrium

Thickness: 25 mm.  No focal abnormality visualized.

Right ovary

Measurements: 2.5 x 1.5 x 2.1 cm = volume: 4.3 mL. Normal
appearance/no adnexal mass.

Left ovary

Measurements: 1.9 x 1.7 x 1.7 cm = volume: 2.9 mL. Normal
appearance/no adnexal mass.

Other findings

Small free fluid
IMPRESSION: 1. Endometrial thickness of 25 mm. Endometrial thickness is
considered abnormal. Consider follow-up by US in 6-8 weeks, during
the week immediately following menses (exam timing is critical).
2. Small free fluid in the pelvis. Otherwise negative pelvic
ultrasound

## 2020-10-19 ENCOUNTER — Inpatient Hospital Stay (HOSPITAL_COMMUNITY): Payer: Medicaid Other

## 2020-10-19 ENCOUNTER — Inpatient Hospital Stay (HOSPITAL_COMMUNITY)
Admission: AD | Admit: 2020-10-19 | Discharge: 2020-10-19 | Disposition: A | Discharge status: Home / Self Care | Payer: Medicaid Other | Attending: Obstetrics and Gynecology | Admitting: Obstetrics and Gynecology

## 2020-10-19 ENCOUNTER — Other Ambulatory Visit: Payer: Self-pay

## 2020-10-19 ENCOUNTER — Encounter (HOSPITAL_COMMUNITY): Payer: Self-pay | Admitting: Obstetrics and Gynecology

## 2020-10-19 DIAGNOSIS — R110 Nausea: Secondary | ICD-10-CM | POA: Diagnosis not present

## 2020-10-19 DIAGNOSIS — O99331 Smoking (tobacco) complicating pregnancy, first trimester: Secondary | ICD-10-CM | POA: Insufficient documentation

## 2020-10-19 DIAGNOSIS — O26891 Other specified pregnancy related conditions, first trimester: Secondary | ICD-10-CM

## 2020-10-19 DIAGNOSIS — O3680X0 Pregnancy with inconclusive fetal viability, not applicable or unspecified: Secondary | ICD-10-CM

## 2020-10-19 DIAGNOSIS — F17210 Nicotine dependence, cigarettes, uncomplicated: Secondary | ICD-10-CM | POA: Insufficient documentation

## 2020-10-19 DIAGNOSIS — N939 Abnormal uterine and vaginal bleeding, unspecified: Secondary | ICD-10-CM

## 2020-10-19 DIAGNOSIS — Z3A01 Less than 8 weeks gestation of pregnancy: Secondary | ICD-10-CM | POA: Insufficient documentation

## 2020-10-19 DIAGNOSIS — O209 Hemorrhage in early pregnancy, unspecified: Secondary | ICD-10-CM | POA: Diagnosis present

## 2020-10-19 HISTORY — DX: Other specified health status: Z78.9

## 2020-10-19 HISTORY — DX: Hypothyroidism, unspecified: E03.9

## 2020-10-19 LAB — COMPREHENSIVE METABOLIC PANEL
ALT: 18 U/L (ref 0–44)
AST: 23 U/L (ref 15–41)
Albumin: 3.2 g/dL — ABNORMAL LOW (ref 3.5–5.0)
Alkaline Phosphatase: 48 U/L (ref 38–126)
Anion gap: 9 (ref 5–15)
BUN: 6 mg/dL (ref 6–20)
CO2: 24 mmol/L (ref 22–32)
Calcium: 8.9 mg/dL (ref 8.9–10.3)
Chloride: 106 mmol/L (ref 98–111)
Creatinine, Ser: 0.97 mg/dL (ref 0.44–1.00)
GFR, Estimated: >60 mL/min (ref >60)
Glucose, Bld: 83 mg/dL (ref 70–99)
Potassium: 4.1 mmol/L (ref 3.5–5.1)
Sodium: 139 mmol/L (ref 135–145)
Total Bilirubin: 0.5 mg/dL (ref 0.3–1.2)
Total Protein: 6.7 g/dL (ref 6.5–8.1)

## 2020-10-19 LAB — CBC
HCT: 40.6 % (ref 36.0–46.0)
Hemoglobin: 12.6 g/dL (ref 12.0–15.0)
MCH: 25.8 pg — ABNORMAL LOW (ref 26.0–34.0)
MCHC: 31 g/dL (ref 30.0–36.0)
MCV: 83 fL (ref 80.0–100.0)
Platelets: 363 10*3/uL (ref 150–400)
RBC: 4.89 MIL/uL (ref 3.87–5.11)
RDW: 13.3 % (ref 11.5–15.5)
WBC: 6.4 10*3/uL (ref 4.0–10.5)
nRBC: 0 % (ref 0.0–0.2)

## 2020-10-19 LAB — WET PREP, GENITAL
Sperm: NONE SEEN
Trich, Wet Prep: NONE SEEN
Yeast Wet Prep HPF POC: NONE SEEN

## 2020-10-19 LAB — HCG, QUANTITATIVE, PREGNANCY: hCG, Beta Chain, Quant, S: 296 m[IU]/mL — ABNORMAL HIGH (ref <5)

## 2020-10-19 LAB — ABO/RH
ABO/RH(D): B NEG
Antibody Screen: NEGATIVE

## 2020-10-19 MED ORDER — RHO D IMMUNE GLOBULIN 1500 UNIT/2ML IJ SOSY
300.0000 ug | PREFILLED_SYRINGE | Freq: Once | INTRAMUSCULAR | Status: AC
  Administered 2020-10-19: 300 ug via INTRAMUSCULAR
  Filled 2020-10-19: qty 2

## 2020-10-19 NOTE — Discharge Instructions (Signed)
Ectopic Pregnancy ° °An ectopic pregnancy happens when a fertilized egg grows outside the womb (uterus). The fertilized egg cannot stay alive outside of the womb. This problem often happens in a fallopian tube. It is often caused by damage to the tube. °If this problem is found early, you may be treated with medicine that stops the egg from growing. If your tube tears or bursts open (ruptures), you will bleed inside. Often, there is very bad pain in the lower belly. This is an emergency. You will need surgery. Get help right away. °Follow these instructions at home: °After being treated with medicine or surgery: °· Rest and limit your activity for as long as told by your doctor. °· Until your doctor says that it is safe: °? Do not lift anything that is heavier than 10 lb (4.5 kg) or the limit that your doctor tells you. °? Avoid exercise and any movement that takes a lot of effort. °· To prevent problems when pooping (constipation): °? Eat a healthy diet. This includes: °§ Fruits. °§ Vegetables. °§ Whole grains. °? Drink 6-8 glasses of water a day. °Contact a doctor if: °Get help right away if: °· You have sudden and very bad pain in your belly. °· You have very bad pain in your shoulders or neck. °· You have pain that gets worse and is not helped by medicine. °· You have: °? A fever or chills. °? Vaginal bleeding. °? Redness or swelling at the site of a surgical cut (incision). °· You feel sick to your stomach (nauseous) or you throw up (vomit). °· You feel dizzy or weak. °· You feel light-headed or you pass out (faint). °Summary °· An ectopic pregnancy happens when a fertilized egg grows outside the womb (uterus). °· If this problem is found early, you may be treated with medicine that stops the egg from growing. °· If your tube tears or bursts open (ruptures), you will need surgery. This is an emergency. Get help right away. °This information is not intended to replace advice given to you by your health care  provider. Make sure you discuss any questions you have with your health care provider. °Document Revised: 11/14/2017 Document Reviewed: 12/26/2016 °Elsevier Patient Education © 2020 [employer] ° °

## 2020-10-19 NOTE — ED Triage Notes (Signed)
Emergency Medicine Provider OB Triage Evaluation Note  Heather Kelly is a 39 y.o. female, No obstetric history on file., at Unknown gestation who presents to the emergency department with complaints of vaginal bleeding and pelvic cramping.  Review of  Systems  Positive: vaginal bleeding, pelvic crmping Negative: fevers  Physical Exam  BP 129/80    Pulse 98    Temp 98.6 F (37 C) (Oral)    Resp 16    Ht 5' 9.75" (1.772 m)    Wt (!) 140.2 kg    SpO2 100%    BMI 44.66 kg/m  General: Awake, no distress  HEENT: Atraumatic  Resp: Normal effort  Cardiac: Normal rate Abd: Nondistended, nontender  MSK: Moves all extremities without difficulty Neuro: Speech clear  Medical Decision Making  Pt evaluated for pregnancy concern and is stable for transfer to [facility]. Pt is in agreement with plan for transfer.  10:34 AM Discussed with [facility] APP, Basil Dess, who accepts patient in transfer.  Clinical Impression  No diagnosis found.     Karrie Meres, PA-C 10/19/20 1034

## 2020-10-19 NOTE — MAU Provider Note (Addendum)
History     CSN: [id number]  Arrival date and time: 10/19/20 1015   First Provider Initiated Contact with Patient 10/19/20 1156      Chief Complaint  Patient presents with  . Vaginal Bleeding  Patient Heather Kelly is a [redacted]w[redacted]d  39 year old female presents to MAU with vaginal bleeding and cramping. She says that the bleeding started yesterday and has progressively gotten worse. She says the cramping is constant and is dull. She was seen at the [facility] clinic yesterday and got a Bhcg level for her spotting which was 273. She says that she has a follow-up tomorrow, but wanted to have an answer sooner. She has had some nausea, but denies vomiting. She feels like her abdomen is distended and that she has a lot of gas.    OB History    Gravida  3   Para  2   Term  2   Preterm      AB      Living  2     SAB      TAB      Ectopic      Multiple      Live Births  2           Past Medical History:  Diagnosis Date  . Hypothyroidism   . Medical history non-contributory     Past Surgical History:  Procedure Laterality Date  . CESAREAN SECTION    . CESAREAN SECTION      History reviewed. No pertinent family history.  Social History   Tobacco Use  . Smoking status: Current Every Day Smoker    Types: Cigarettes  . Smokeless tobacco: Never Used  Substance Use Topics  . Alcohol use: Yes  . Drug use: No    Allergies: No Known Allergies  Medications Prior to Admission  Medication Sig Dispense Refill Last Dose  . Prenatal Vit-Fe Fumarate-FA (PRENATAL MULTIVITAMIN) TABS tablet Take 1 tablet by mouth daily at 12 noon.   10/18/2020 at Unknown time  . cetirizine-pseudoephedrine (ZYRTEC-D) 5-120 MG tablet Take 1 tablet by mouth daily. 15 tablet 0  at not taking  . doxycycline (VIBRAMYCIN) 100 MG capsule Take 1 capsule (100 mg total) by mouth 2 (two) times daily. 20 capsule 0  at not taking  . erythromycin ophthalmic ointment Place a 1/2 inch ribbon of ointment into  the lower eyelid of right eye 4 times per day for 5 days. 1 g 0  at not taking  . fluticasone (FLONASE) 50 MCG/ACT nasal spray Place 2 sprays into both nostrils daily. 1 g 0  at not taking  . folic acid (FOLVITE) 0.5 MG tablet Take 0.5 mg by mouth daily.    at not taking  . HYDROcodone-acetaminophen (NORCO) 5-325 MG tablet Take 1 tablet by mouth every 6 (six) hours as needed for moderate pain. (Patient not taking: Reported on 10/28/2016) 5 tablet 0  at not taking  . ipratropium (ATROVENT) 0.06 % nasal spray Place 2 sprays into both nostrils 4 (four) times daily. 15 mL 0  at not taking  . methocarbamol (ROBAXIN) 500 MG tablet Take 1 tablet (500 mg total) by mouth 2 (two) times daily as needed for muscle spasms. (Patient not taking: Reported on 10/28/2016) 20 tablet 0  at not taking  . metroNIDAZOLE (FLAGYL) 500 MG tablet Take 1 tablet (500 mg total) by mouth 2 (two) times daily. 14 tablet 0  at not taking  . Multiple Vitamins-Minerals (MULTIVITAMIN WITH MINERALS)  tablet Take 1 tablet by mouth daily.    at not taking  . naproxen (NAPROSYN) 500 MG tablet Take 1 tablet (500 mg total) by mouth 2 (two) times daily as needed. (Patient not taking: Reported on 10/28/2016) 30 tablet 0  at not taking    Review of Systems  Gastrointestinal: Positive for abdominal distention and nausea. Negative for vomiting.  Genitourinary: Positive for pelvic pain and vaginal bleeding. Negative for difficulty urinating, dysuria and vaginal discharge.  Skin: Negative.    Physical Exam   Blood pressure 129/80, pulse 98, temperature 98.6 F (37 C), temperature source Oral, resp. rate 16, height 5' 9.75" (1.772 m), weight (!) 140.2 kg, last menstrual period 08/30/2020, SpO2 100 %.  Physical Exam Vitals and nursing note reviewed. Exam conducted with a chaperone present.  Constitutional:      General: She is not in acute distress.    Appearance: Normal appearance.  Cardiovascular:     Rate and Rhythm: Normal rate and  regular rhythm.     Heart sounds: Normal heart sounds.  Pulmonary:     Effort: Pulmonary effort is normal.     Breath sounds: Normal breath sounds.  Abdominal:     General: There is distension.     Tenderness: There is no abdominal tenderness.  Genitourinary:    Vagina: Bleeding present. No vaginal discharge or tenderness.     Cervix: Cervical bleeding present. No cervical motion tenderness or discharge.     Uterus: Normal. Not deviated and not tender.      Adnexa: Right adnexa normal and left adnexa normal.  Neurological:     Mental Status: She is alert.     MAU Course  Procedures  MDM -Progressive vaginal bleeding and cramping  -beta HCG 273 (11/3) to 296 (11/4) -No IUP seen on U/S -Patient expelled unknown specimen sent for patho review   Assessment and Plan  39 year old female presents in MAU for vaginal bleeding and cramping secondary to pregnancy of unknown location.  -Rho gam administered  -Specimen sent to pathology -Strict ectopic precautions reviewed  -Patient will follow-up with [facility] tomorrow for scheduled beta HCG  Zadie Cleverly 10/19/2020, 12:26 PM    CNM attestation:  I have seen and examined this patient and agree with above documentation in the PA student's note.   Heather Kelly is a 39 y.o. G3P2002 at [redacted]w[redacted]d reporting abdominal pain and bleeding. She denies dysuria. Had stat bHCG at her provider's office yesterday and it was 273.  PE: Patient Vitals for the past 24 hrs:  BP Temp Temp src Pulse Resp SpO2 Height Weight  10/19/20 1527 99/73 -- -- 86 18 100 % -- --  10/19/20 1021 129/80 98.6 F (37 C) Oral 98 16 100 % 5' 9.75" (1.772 m) (!) 140.2 kg   Gen: calm comfortable, NAD Resp: normal effort, no distress Heart: Regular rate Abd: Soft, NT, gravid, S=D Vagina: NEFG: dark blood in the vagina, blood extruding from the cervical os; uterus is non-tender, s=d, no CMT.   ROS, labs, PMH reviewed  Orders Placed This Encounter  Procedures  . Wet  prep, genital  . US OB Transvaginal  . US OB Comp Less 14 Wks  . CBC  . Comprehensive metabolic panel  . hCG, quantitative, pregnancy  . ABO/Rh  . Rh IG workup (includes ABO/Rh)  . Discharge patient Discharge disposition: 01-Home or Self Care; Discharge patient date: 10/19/2020   Meds ordered this encounter  Medications  . rho (d)  immune globulin (RHIG/RHOPHYLAC) injection 300 mcg    MDM -Quant today is 296 -wet prep shows clue cells but not treated due to lack of other symptoms -Rhogam given in MAU -US shows nothing in uterus, cannot exclude possible ectopic.  -Records reviewed from [facility] that shows beta was 273 yesterday -Patient passed small tissue, possible gestational sac, felt pain improved after passing tissue.   Assessment: 1. Pregnancy of unknown anatomic location   2. Vaginal bleeding     Plan: -Follow up from [facility] tomorrow -Consult with Jolayne Panther, Gigi Gin, MD. Who agrees with plan of care -Strict ectopic precautions given, patient verbalized understanding.  - Return to maternity admissions symptoms worsen -possible POC sent to pathology -GC CT pending  Marylene Land, CNM 10/19/2020 3:42 PM

## 2020-10-19 NOTE — MAU Note (Signed)
Pt report she started having vaginal bleeding and cramping yesterday. Went to doctor yestrerday for testing.  (BHCG 273)in care evrywhere.

## 2020-10-20 LAB — GC/CHLAMYDIA PROBE AMP (~~LOC~~) NOT AT ARMC
Chlamydia: NEGATIVE
Comment: NEGATIVE
Comment: NORMAL
Neisseria Gonorrhea: NEGATIVE

## 2020-10-20 LAB — RH IG WORKUP (INCLUDES ABO/RH)
ABO/RH(D): B NEG
Gestational Age(Wks): 7
Unit division: 0

## 2020-10-23 LAB — SURGICAL PATHOLOGY

## 2021-01-23 DIAGNOSIS — G4733 Obstructive sleep apnea (adult) (pediatric): Secondary | ICD-10-CM | POA: Insufficient documentation

## 2021-04-03 IMAGING — US US OB TRANSVAGINAL
1 series · 15 of 28 positions shown · non-contrast
Comparison: Pelvic ultrasound dated October 20, 2019.

CLINICAL DATA: Vaginal bleeding and cramps. Estimated gestational
age of 7 weeks, 1 day by LMP.

EXAM:
OBSTETRIC <14 WK US AND TRANSVAGINAL OB US
TECHNIQUE: Both transabdominal and transvaginal ultrasound examinations were
performed for complete evaluation of the gestation as well as the
maternal uterus, adnexal regions, and pelvic cul-de-sac.
Transvaginal technique was performed to assess early pregnancy.

[Series 1: us ob transvaginal · 15 of 58 slices shown]
[im 1/58]
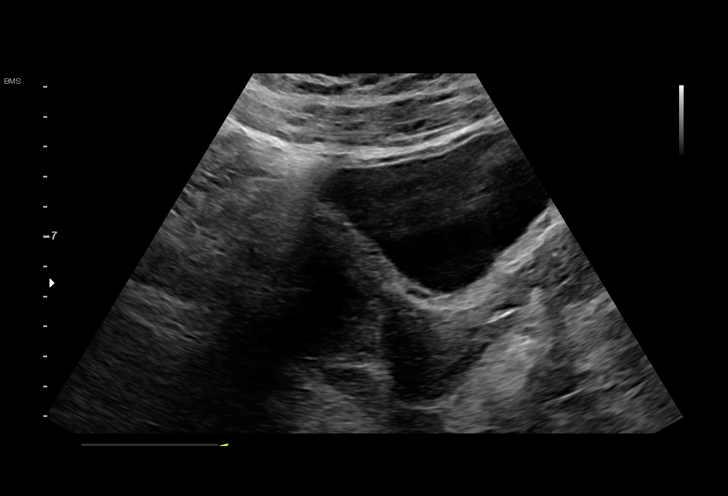
[im 5/58]
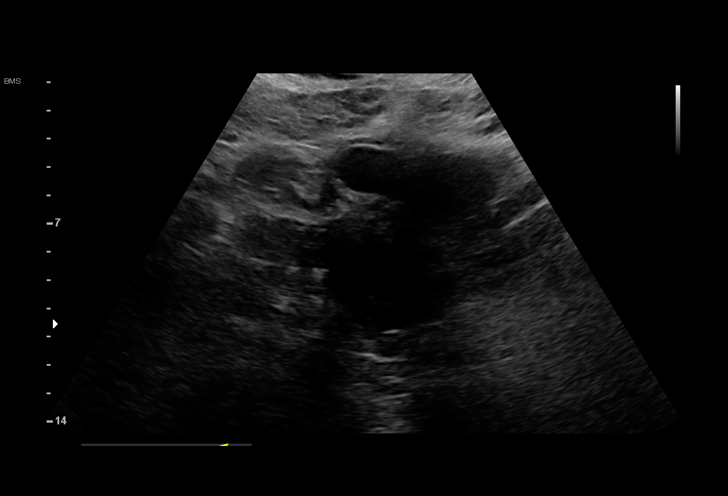
[im 9/58]
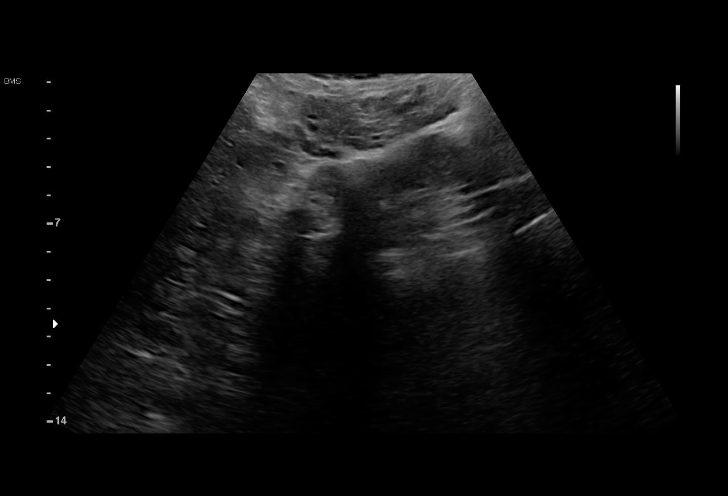
[im 13/58]
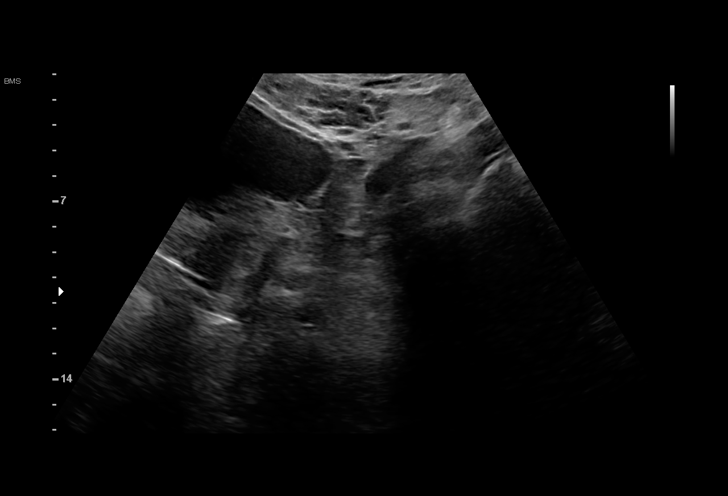
[im 17/58]
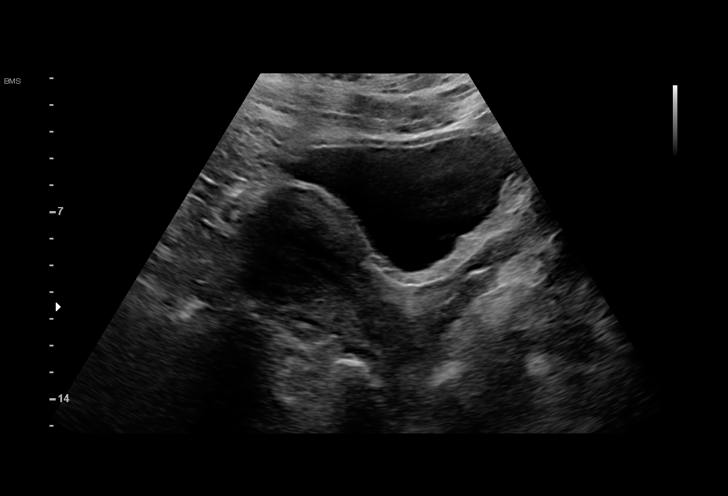
[im 22/58]
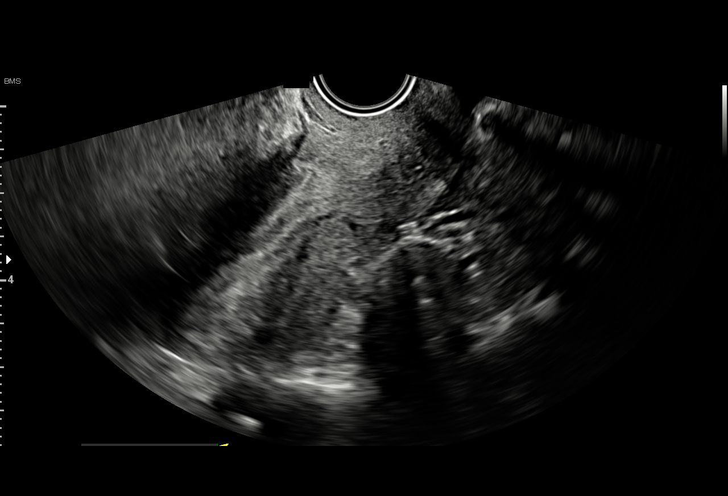
[im 26/58]
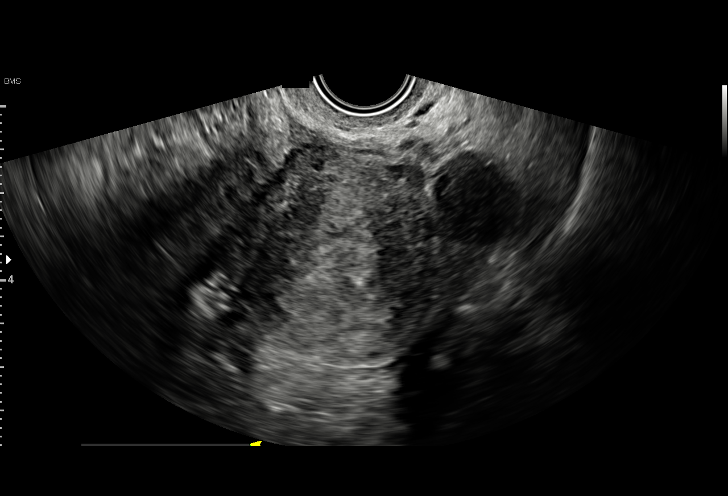
[im 30/58]
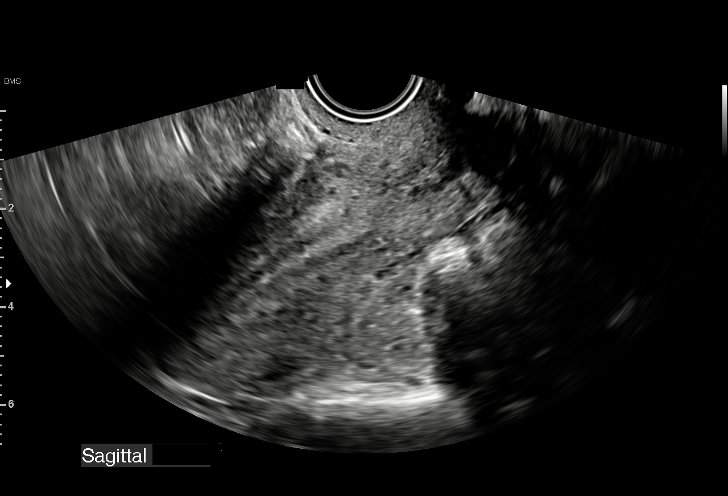
[im 32/58]
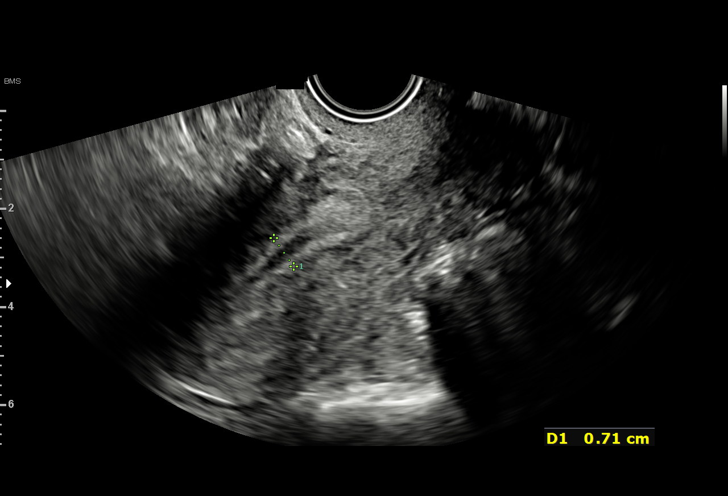
[im 36/58]
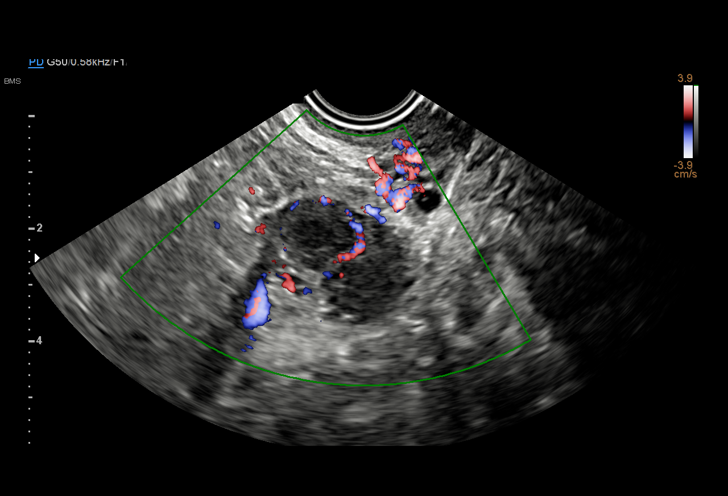
[im 41/58]
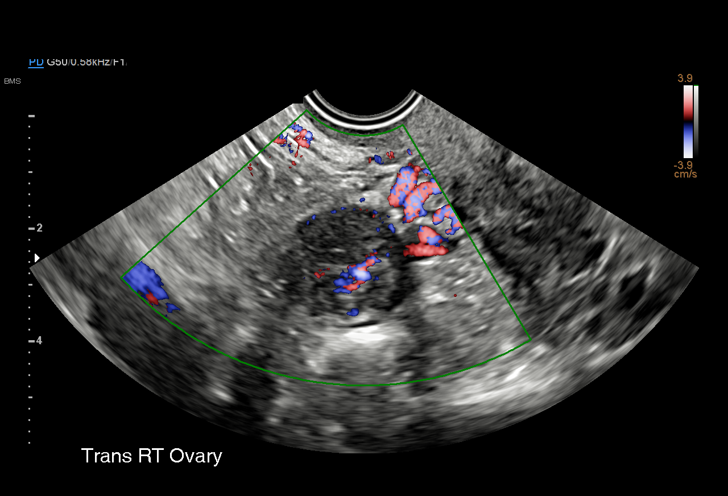
[im 45/58]
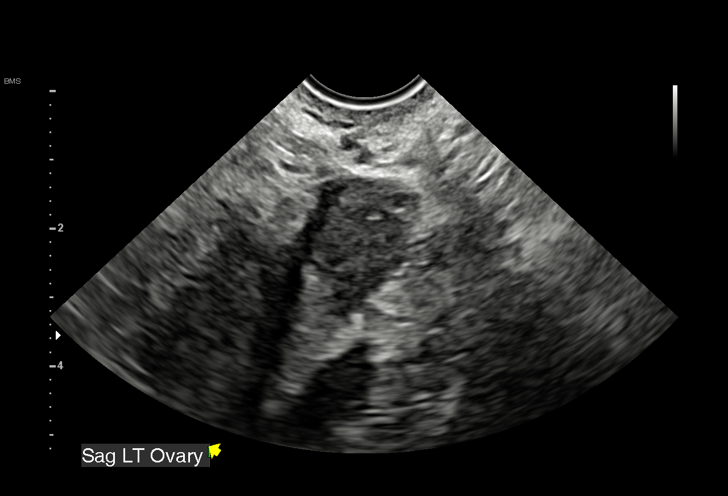
[im 49/58]
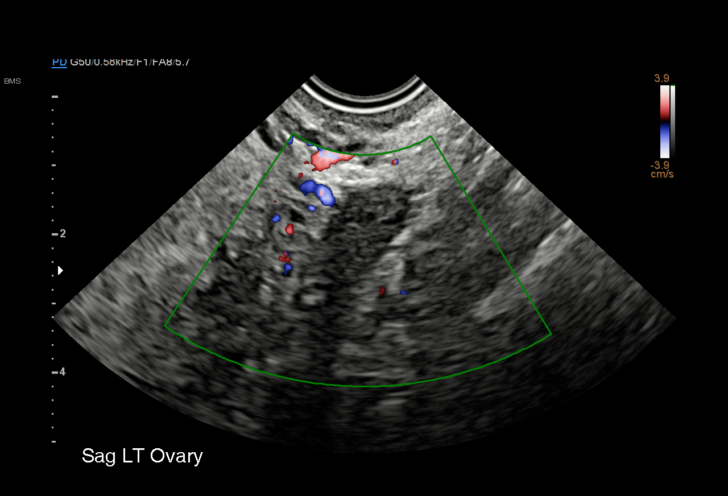
[im 53/58]
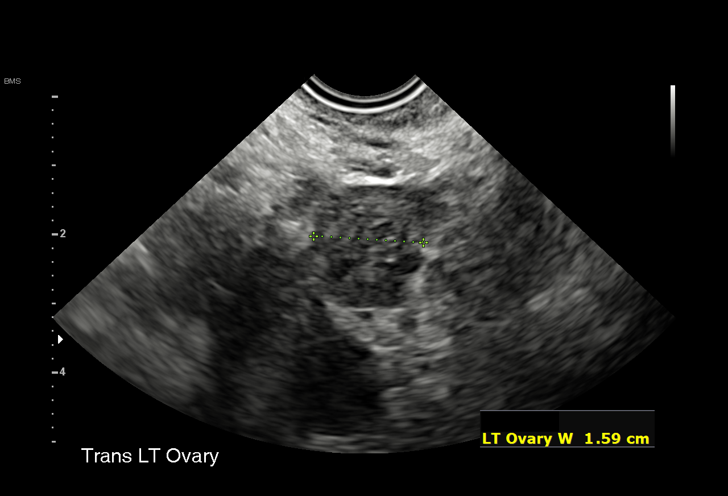
[im 58/58]
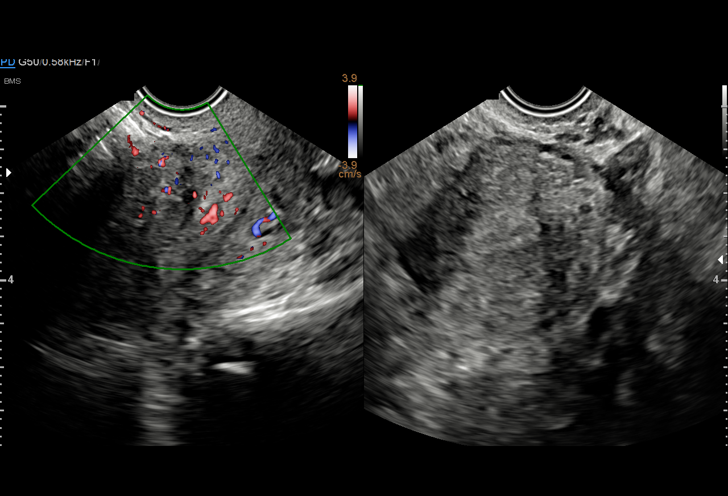

[15 of 28 positions shown; findings below may reference images not displayed]

FINDINGS: Intrauterine gestational sac: None.

Maternal uterus/adnexae: Suspected 1.5 x 1.9 x 1.7 cm anterior
uterine intramural fibroid. Right ovarian corpus luteum.
IMPRESSION: No IUP is visualized. By definition, in the setting of a positive
pregnancy test, this reflects a pregnancy of unknown location.
Differential considerations include early normal IUP, abnormal
IUP/missed abortion, or nonvisualized ectopic pregnancy. Serial beta
HCG is suggested. Consider repeat pelvic ultrasound in 14 days.

## 2021-04-10 HISTORY — PX: BARIATRIC SURGERY: SHX1103

## 2022-02-19 ENCOUNTER — Other Ambulatory Visit: Payer: Self-pay | Admitting: Physician Assistant

## 2022-02-19 DIAGNOSIS — E039 Hypothyroidism, unspecified: Secondary | ICD-10-CM

## 2022-02-19 DIAGNOSIS — E010 Iodine-deficiency related diffuse (endemic) goiter: Secondary | ICD-10-CM

## 2022-02-20 ENCOUNTER — Ambulatory Visit
Admission: RE | Admit: 2022-02-20 | Discharge: 2022-02-20 | Disposition: A | Discharge status: Home / Self Care | Payer: Medicaid Other | Source: Ambulatory Visit | Attending: Physician Assistant | Admitting: Physician Assistant

## 2022-02-20 DIAGNOSIS — E010 Iodine-deficiency related diffuse (endemic) goiter: Secondary | ICD-10-CM

## 2022-02-20 DIAGNOSIS — E039 Hypothyroidism, unspecified: Secondary | ICD-10-CM

## 2022-04-17 ENCOUNTER — Ambulatory Visit (INDEPENDENT_AMBULATORY_CARE_PROVIDER_SITE_OTHER): Payer: Medicaid Other | Admitting: Emergency Medicine

## 2022-04-17 DIAGNOSIS — Z3201 Encounter for pregnancy test, result positive: Secondary | ICD-10-CM

## 2022-04-17 DIAGNOSIS — Z3200 Encounter for pregnancy test, result unknown: Secondary | ICD-10-CM

## 2022-04-17 NOTE — Progress Notes (Signed)
Heather Kelly presents today for UPT. She has no unusual complaints. ?LMP: 12/18/2022  ?   ?OBJECTIVE: Appears well, in no apparent distress.  ?OB History   ? ? Gravida  ?3  ? Para  ?2  ? Term  ?2  ? Preterm  ?   ? AB  ?   ? Living  ?2  ?  ? ? SAB  ?   ? IAB  ?   ? Ectopic  ?   ? Multiple  ?   ? Live Births  ?2  ?   ?  ?  ? ?Home UPT Result: positive ?In-Office UPT result:positive ?I have reviewed the patient's medical, obstetrical, social, and family histories, and medications.  ? ?ASSESSMENT: Positive pregnancy test ? ?PLAN ?Prenatal care to be completed at: [name]  ? ?

## 2022-05-07 ENCOUNTER — Ambulatory Visit: Payer: Medicaid Other | Admitting: Obstetrics

## 2022-05-17 ENCOUNTER — Ambulatory Visit (INDEPENDENT_AMBULATORY_CARE_PROVIDER_SITE_OTHER): Payer: Medicaid Other | Admitting: *Deleted

## 2022-05-17 ENCOUNTER — Ambulatory Visit (INDEPENDENT_AMBULATORY_CARE_PROVIDER_SITE_OTHER): Payer: Medicaid Other

## 2022-05-17 VITALS — BP 131/86 | Wt 215.8 lb

## 2022-05-17 DIAGNOSIS — O0990 Supervision of high risk pregnancy, unspecified, unspecified trimester: Secondary | ICD-10-CM | POA: Diagnosis not present

## 2022-05-17 DIAGNOSIS — O3680X0 Pregnancy with inconclusive fetal viability, not applicable or unspecified: Secondary | ICD-10-CM

## 2022-05-17 DIAGNOSIS — Z3A09 9 weeks gestation of pregnancy: Secondary | ICD-10-CM

## 2022-05-17 DIAGNOSIS — Z3481 Encounter for supervision of other normal pregnancy, first trimester: Secondary | ICD-10-CM

## 2022-05-17 MED ORDER — GOJJI WEIGHT SCALE MISC: 1.0000 | 0 refills | Status: DC

## 2022-05-17 MED ORDER — BLOOD PRESSURE KIT DEVI: 1.0000 | 0 refills | Status: DC

## 2022-05-17 NOTE — Progress Notes (Cosign Needed Addendum)
New OB Intake  I connected with  Heather Kelly on 05/17/22 at 10:15 AM EDT by in person Visit and verified that I am speaking with the correct person using two identifiers. Nurse is located at [name] and pt is located at [place].  I discussed the limitations, risks, security and privacy concerns of performing an evaluation and management service by telephone and the availability of in person appointments. I also discussed with the patient that there may be a patient responsible charge related to this service. The patient expressed understanding and agreed to proceed.  I explained I am completing New OB Intake today. We discussed her EDD of 12/18/22 that is based on LMP of 03/13/22. Pt is G4/P2. I reviewed her allergies, medications, Medical/Surgical/OB history, and appropriate screenings. I informed her of [facility] services. Based on history, this is a/an  pregnancy complicated by history of C/S X 2  .   There are no problems to display for this patient.   Concerns addressed today  Delivery Plans:  Plans to deliver at [facility].   MyChart/Babyscripts MyChart access verified. I explained pt will have some visits in office and some virtually. Babyscripts instructions given and order placed. Patient verifies receipt of registration text/e-mail. Account successfully created and app downloaded.  Blood Pressure Cuff  Blood pressure cuff ordered for patient to pick-up from [employer]. Explained after first prenatal appt pt will check weekly and document in Babyscripts.  Weight scale: Patient does / does not  have weight scale. Weight scale ordered for patient to pick up from [employer].   Anatomy US Explained first scheduled [place] will be around 19 weeks. Anatomy US scheduled for 19 at MFM. Pt notified to arrive at [facility]. Scheduled AFP lab only appointment if CenteringPregnancy pt for same day as anatomy US.   Labs Discussed Avelina Laine genetic screening with patient. Would like both Panorama and  Horizon drawn at new OB visit.Also if interested in genetic testing, tell patient she will need AFP 15-21 weeks to complete genetic testing .Routine prenatal labs needed.  Covid Vaccine Patient has not covid vaccine.   Is patient a CenteringPregnancy candidate?  Declined Declined due to Group Setting Not a candidate due to NA Centering Patient" indicated on sticky note   Is patient a Mom+Baby Combined Care candidate?  Not a candidate     Is patient interested in [place]?  Not a candidate  Informed patient of [facility] website  and placed link in her AVS.   Social Determinants of Health Food Insecurity: Patient denies food insecurity. WIC Referral: Patient is interested in referral to [facility].  Transportation: Patient denies transportation needs. Childcare: Discussed no children allowed at ultrasound appointments. Offered childcare services; patient declines childcare services at this time.  Send link to Pregnancy Navigators   Placed OB Box on problem list and updated  First visit review I reviewed new OB appt with pt. I explained she will have a pelvic exam, ob bloodwork with genetic screening, and PAP smear. Explained pt will be seen by Corlis Hove, NP at first visit; encounter routed to appropriate provider. Explained that patient will be seen by pregnancy navigator following visit with provider. [facility] information placed in AVS.   Harrel Lemon, RN 05/17/2022  10:27 AM

## 2022-05-18 NOTE — Progress Notes (Signed)
Agree with nurses's documentation of this patient's clinic encounter.  Kyndal Heringer L, MD  

## 2022-06-05 ENCOUNTER — Ambulatory Visit (INDEPENDENT_AMBULATORY_CARE_PROVIDER_SITE_OTHER): Payer: Medicaid Other | Admitting: Student

## 2022-06-05 ENCOUNTER — Other Ambulatory Visit (HOSPITAL_COMMUNITY)
Admission: RE | Admit: 2022-06-05 | Discharge: 2022-06-05 | Disposition: A | Discharge status: Home / Self Care | Payer: Medicaid Other | Source: Ambulatory Visit | Attending: Student | Admitting: Student

## 2022-06-05 ENCOUNTER — Encounter: Payer: Self-pay | Admitting: Student

## 2022-06-05 VITALS — BP 108/71 | HR 62 | Wt 218.0 lb

## 2022-06-05 DIAGNOSIS — O0990 Supervision of high risk pregnancy, unspecified, unspecified trimester: Secondary | ICD-10-CM | POA: Insufficient documentation

## 2022-06-05 DIAGNOSIS — O99281 Endocrine, nutritional and metabolic diseases complicating pregnancy, first trimester: Secondary | ICD-10-CM

## 2022-06-05 DIAGNOSIS — O09529 Supervision of elderly multigravida, unspecified trimester: Secondary | ICD-10-CM | POA: Insufficient documentation

## 2022-06-05 DIAGNOSIS — O34219 Maternal care for unspecified type scar from previous cesarean delivery: Secondary | ICD-10-CM

## 2022-06-05 DIAGNOSIS — Z3A12 12 weeks gestation of pregnancy: Secondary | ICD-10-CM | POA: Diagnosis not present

## 2022-06-05 DIAGNOSIS — O0991 Supervision of high risk pregnancy, unspecified, first trimester: Secondary | ICD-10-CM | POA: Diagnosis not present

## 2022-06-05 DIAGNOSIS — O09521 Supervision of elderly multigravida, first trimester: Secondary | ICD-10-CM

## 2022-06-05 DIAGNOSIS — E039 Hypothyroidism, unspecified: Secondary | ICD-10-CM

## 2022-06-05 NOTE — Progress Notes (Signed)
Pt presents for NOB visit. No concerns at this time.  

## 2022-06-05 NOTE — Progress Notes (Signed)
History:   Heather Kelly is a 41 y.o. [name] at 11w0dby LMP being seen today for her first obstetrical visit.  Her obstetrical history is significant for advanced maternal age and previous cesarean deliveries x 2 . Patient does intend to breast feed. Pregnancy history fully reviewed.  Patient reports no complaints.      HISTORY: OB History  Gravida Para Term Preterm AB Living  4 2 2  0 1 2  SAB IAB Ectopic Multiple Live Births  1 0 0 0 2    # Outcome Date GA Lbr Len/2nd Weight Sex Delivery Anes PTL Lv  4 Current           3 SAB 10/19/20          2 Term 03/29/10    F CS-LTranv   LIV  1 Term 10/23/06    M CS-LTranv   LIV    Last pap smear was done unknown, per patient and was normal. Patient has hx. Of abnormal Pap, but unsure of what type.  Past Medical History:  Diagnosis Date   Hypothyroidism    Medical history non-contributory    Past Surgical History:  Procedure Laterality Date   BARIATRIC SURGERY  04/10/2021   CESAREAN SECTION     CESAREAN SECTION     Family History  Problem Relation Age of Onset   Hypertension Mother    Cancer Father    Hypertension Maternal Grandmother    Cancer Maternal Grandfather    Social History   Tobacco Use   Smoking status: Former    Types: Cigarettes    Quit date: 03/03/2016    Years since quitting: 6.2   Smokeless tobacco: Never  Vaping Use   Vaping Use: Never used  Substance Use Topics   Alcohol use: Not Currently   Drug use: No   No Known Allergies Current Outpatient Medications on File Prior to Visit  Medication Sig Dispense Refill   Blood Pressure Monitoring (BLOOD PRESSURE KIT) DEVI 1 Device by Does not apply route once a week. 1 each 0   Cholecalciferol 25 MCG (1000 UT) tablet Take by mouth.     levothyroxine (SYNTHROID) 75 MCG tablet Take 75 mcg by mouth daily.     Misc. Devices (GOJJI WEIGHT SCALE) MISC 1 Device by Does not apply route once a week. 1 each 0   Multiple Vitamins-Minerals (MULTIVITAMIN WITH  MINERALS) tablet Take 1 tablet by mouth daily. Bariatric     polyethylene glycol powder (GLYCOLAX/MIRALAX) 17 GM/SCOOP powder Take 1 Container by mouth daily as needed.     cetirizine-pseudoephedrine (ZYRTEC-D) 5-120 MG tablet Take 1 tablet by mouth daily. 15 tablet 0   ipratropium (ATROVENT) 0.06 % nasal spray Place 2 sprays into both nostrils 4 (four) times daily. 15 mL 0   Prenatal Vit-Fe Fumarate-FA (PRENATAL VITAMINS) 28-0.8 MG TABS Take 1 tablet by mouth daily. (Patient not taking: Reported on 06/05/2022)     No current facility-administered medications on file prior to visit.    Review of Systems Pertinent items noted in HPI and remainder of comprehensive ROS otherwise negative. Physical Exam:   Vitals:   06/05/22 1026  BP: 108/71  Pulse: 62  Weight: 218 lb (98.9 kg)   Fetal Heart Rate (bpm): Present on [place]terus:     Pelvic Exam: Perineum: no hemorrhoids, normal perineum   Vulva: normal external genitalia, no lesions   Vagina:  normal mucosa, normal discharge   Cervix: no lesions and normal, pap smear done.  Adnexa: normal adnexa and no mass, fullness, tenderness   Bony Pelvis: average  System: General: well-developed, well-nourished female in no acute distress   Breasts:  normal appearance, no masses or tenderness bilaterally   Skin: normal coloration and turgor, no rashes   Neurologic: oriented, normal, negative, normal mood   Extremities: normal strength, tone, and muscle mass, ROM of all joints is normal   HEENT PERRLA, extraocular movement intact and sclera clear, anicteric   Mouth/Teeth mucous membranes moist, pharynx normal without lesions and dental hygiene good   Neck supple and no masses   Cardiovascular: regular rate and rhythm   Respiratory:  no respiratory distress, normal breath sounds   Abdomen: soft, non-tender; bowel sounds normal; no masses,  no organomegaly  Bedside Ultrasound for FHR check completed: Patient informed that the ultrasound is  considered a limited obstetric ultrasound and is not intended to be a complete ultrasound exam.  Patient also informed that the ultrasound is not being completed with the intent of assessing for fetal or placental anomalies or any pelvic abnormalities.  Explained that the purpose of today's ultrasound is to assess for fetal heart rate.  Patient acknowledges the purpose of the exam and the limitations of the study.     Assessment:    Pregnancy: [name] Patient Active Problem List   Diagnosis Date Noted   AMA (advanced maternal age) multigravida 35+ 06/05/2022   History of 2 cesarean deliveries, currently pregnant 06/05/2022   Hypothyroidism affecting pregnancy 06/05/2022   Supervision of high risk pregnancy, antepartum 05/17/2022     Plan:    1. Supervision of high risk pregnancy, antepartum -well appearing exam - CBC/D/Plt+RPR+Rh+ABO+RubIgG... - Culture, OB Urine - Genetic Screening - Cytology - PAP - Comprehensive metabolic panel - Hemoglobin A1c - Cervicovaginal ancillary only( [place]) - [place] MFM OB DETAIL +14 WK; Future  2. [redacted] weeks gestation of pregnancy - Routine follow-up   3. Multigravida of advanced maternal age in first trimester - Will need Growth U/S @ 32 weeks per MFM guidelines,order placed -Genetic screening offered  4. History of 2 cesarean deliveries, currently pregnant - Patient would like to discuss VBAC options  5. Hypothyroidism affecting pregnancy in first trimester - Currently on synthroid 75mg daily  - Endocrine is currently following patient   Initial labs drawn. Continue prenatal vitamins. Genetic Screening discussed, Panorama/Horizon ordered Ultrasound discussed; fetal anatomic survey: ordered. Problem list reviewed and updated. [place] Providers was explained to patient; also emphasized that residents, students are part of our team. Routine  obstetric precautions reviewed. No follow-ups on file.   Heather Kelly

## 2022-06-06 LAB — CERVICOVAGINAL ANCILLARY ONLY
Bacterial Vaginitis (gardnerella): POSITIVE — AB
Candida Glabrata: NEGATIVE
Candida Vaginitis: NEGATIVE
Chlamydia: NEGATIVE
Comment: NEGATIVE
Comment: NEGATIVE
Comment: NEGATIVE
Comment: NEGATIVE
Comment: NEGATIVE
Comment: NORMAL
Neisseria Gonorrhea: NEGATIVE
Trichomonas: NEGATIVE

## 2022-06-06 LAB — CBC/D/PLT+RPR+RH+ABO+RUBIGG...
Antibody Screen: NEGATIVE
Basophils Absolute: 0 10*3/uL (ref 0.0–0.2)
Basos: 0 %
EOS (ABSOLUTE): 0.4 10*3/uL (ref 0.0–0.4)
Eos: 6 %
HCV Ab: NONREACTIVE
HIV Screen 4th Generation wRfx: NONREACTIVE
Hematocrit: 42.2 % (ref 34.0–46.6)
Hemoglobin: 13.9 g/dL (ref 11.1–15.9)
Hepatitis B Surface Ag: NEGATIVE
Immature Grans (Abs): 0 10*3/uL (ref 0.0–0.1)
Immature Granulocytes: 1 %
Lymphocytes Absolute: 1.6 10*3/uL (ref 0.7–3.1)
Lymphs: 23 %
MCH: 27.6 pg (ref 26.6–33.0)
MCHC: 32.9 g/dL (ref 31.5–35.7)
MCV: 84 fL (ref 79–97)
Monocytes Absolute: 0.5 10*3/uL (ref 0.1–0.9)
Monocytes: 7 %
Neutrophils Absolute: 4.5 10*3/uL (ref 1.4–7.0)
Neutrophils: 63 %
Platelets: 335 10*3/uL (ref 150–450)
RBC: 5.04 x10E6/uL (ref 3.77–5.28)
RDW: 13.7 % (ref 11.7–15.4)
RPR Ser Ql: NONREACTIVE
Rh Factor: NEGATIVE
Rubella Antibodies, IGG: 10.6 index (ref >0.99)
WBC: 7 10*3/uL (ref 3.4–10.8)

## 2022-06-06 LAB — COMPREHENSIVE METABOLIC PANEL
ALT: 16 IU/L (ref 0–32)
AST: 20 IU/L (ref 0–40)
Albumin/Globulin Ratio: 1.3 (ref 1.2–2.2)
Albumin: 4.1 g/dL (ref 3.8–4.8)
Alkaline Phosphatase: 54 IU/L (ref 44–121)
BUN/Creatinine Ratio: 14 (ref 9–23)
BUN: 9 mg/dL (ref 6–20)
Bilirubin Total: 0.3 mg/dL (ref 0.0–1.2)
CO2: 22 mmol/L (ref 20–29)
Calcium: 9.4 mg/dL (ref 8.7–10.2)
Chloride: 101 mmol/L (ref 96–106)
Creatinine, Ser: 0.63 mg/dL (ref 0.57–1.00)
Globulin, Total: 3.2 g/dL (ref 1.5–4.5)
Glucose: 79 mg/dL (ref 70–99)
Potassium: 4.3 mmol/L (ref 3.5–5.2)
Sodium: 136 mmol/L (ref 134–144)
Total Protein: 7.3 g/dL (ref 6.0–8.5)
eGFR: 116 mL/min/{1.73_m2} (ref >59)

## 2022-06-06 LAB — THYROID PANEL WITH TSH
Free Thyroxine Index: 2.6 (ref 1.2–4.9)
T3 Uptake Ratio: 23 % — ABNORMAL LOW (ref 24–39)
T4, Total: 11.2 ug/dL (ref 4.5–12.0)
TSH: 0.483 u[IU]/mL (ref 0.450–4.500)

## 2022-06-06 LAB — HCV INTERPRETATION

## 2022-06-06 LAB — HEMOGLOBIN A1C
Est. average glucose Bld gHb Est-mCnc: 100 mg/dL
Hgb A1c MFr Bld: 5.1 % (ref 4.8–5.6)

## 2022-06-07 LAB — CYTOLOGY - PAP
Comment: NEGATIVE
Diagnosis: NEGATIVE
High risk HPV: NEGATIVE

## 2022-06-07 LAB — CULTURE, OB URINE

## 2022-06-07 LAB — URINE CULTURE, OB REFLEX: Organism ID, Bacteria: NO GROWTH

## 2022-06-11 ENCOUNTER — Encounter: Payer: Self-pay | Admitting: Obstetrics and Gynecology

## 2022-06-16 ENCOUNTER — Other Ambulatory Visit: Payer: Self-pay | Admitting: Student

## 2022-06-16 DIAGNOSIS — B9689 Other specified bacterial agents as the cause of diseases classified elsewhere: Secondary | ICD-10-CM

## 2022-06-16 MED ORDER — METRONIDAZOLE 500 MG PO TABS: 500.0000 mg | ORAL_TABLET | Freq: Two times a day (BID) | ORAL | 0 refills | Status: DC

## 2022-06-20 ENCOUNTER — Telehealth: Payer: Self-pay | Admitting: *Deleted

## 2022-06-20 ENCOUNTER — Encounter: Payer: Self-pay | Admitting: Obstetrics & Gynecology

## 2022-06-20 ENCOUNTER — Encounter: Payer: Self-pay | Admitting: Obstetrics and Gynecology

## 2022-06-20 DIAGNOSIS — D563 Thalassemia minor: Secondary | ICD-10-CM | POA: Insufficient documentation

## 2022-06-20 NOTE — Telephone Encounter (Signed)
Spoke with pt and reviewed Horizon results.  Pt declined GC with MFM at this time, will discuss with OB provider at next visit.

## 2022-06-21 ENCOUNTER — Telehealth: Payer: Self-pay | Admitting: *Deleted

## 2022-06-21 NOTE — Telephone Encounter (Signed)
Pt called to office stating she was having some lower pelvic pain and she is constipated. Advised pt to increase fluids, start stool softener/Miralax and increase fiber in diet.  Pt made aware she may monitor symptoms at home but if any severe pain, bleeding - she should be seen.

## 2022-06-24 ENCOUNTER — Other Ambulatory Visit: Payer: Self-pay

## 2022-06-24 ENCOUNTER — Inpatient Hospital Stay (HOSPITAL_COMMUNITY)
Admission: AD | Admit: 2022-06-24 | Discharge: 2022-06-24 | Disposition: A | Discharge status: Home / Self Care | Payer: Medicaid Other | Attending: Family Medicine | Admitting: Family Medicine

## 2022-06-24 DIAGNOSIS — O0992 Supervision of high risk pregnancy, unspecified, second trimester: Secondary | ICD-10-CM | POA: Insufficient documentation

## 2022-06-24 DIAGNOSIS — D563 Thalassemia minor: Secondary | ICD-10-CM

## 2022-06-24 DIAGNOSIS — O26892 Other specified pregnancy related conditions, second trimester: Secondary | ICD-10-CM | POA: Diagnosis not present

## 2022-06-24 DIAGNOSIS — O0990 Supervision of high risk pregnancy, unspecified, unspecified trimester: Secondary | ICD-10-CM

## 2022-06-24 DIAGNOSIS — Z3A14 14 weeks gestation of pregnancy: Secondary | ICD-10-CM | POA: Diagnosis not present

## 2022-06-24 DIAGNOSIS — R102 Pelvic and perineal pain: Secondary | ICD-10-CM | POA: Insufficient documentation

## 2022-06-24 LAB — URINALYSIS, ROUTINE W REFLEX MICROSCOPIC
Bilirubin Urine: NEGATIVE
Glucose, UA: NEGATIVE mg/dL
Hgb urine dipstick: NEGATIVE
Ketones, ur: NEGATIVE mg/dL
Leukocytes,Ua: NEGATIVE
Nitrite: NEGATIVE
Protein, ur: NEGATIVE mg/dL
Specific Gravity, Urine: 1.023 (ref 1.005–1.030)
pH: 5 (ref 5.0–8.0)

## 2022-06-24 LAB — WET PREP, GENITAL
Clue Cells Wet Prep HPF POC: NONE SEEN
Sperm: NONE SEEN
Trich, Wet Prep: NONE SEEN
WBC, Wet Prep HPF POC: <10 (ref <10)
Yeast Wet Prep HPF POC: NONE SEEN

## 2022-06-24 NOTE — MAU Note (Signed)
Heather Kelly is a 41 y.o. at [redacted]w[redacted]d here in MAU reporting: intermittent shooting lower pelvic pain for last 2 days.  Hasn't treated pain with any meds. Denies VB or vaginal discharge.    Onset of complaint: 2 days Pain score: 6/10 Vitals:   06/24/22 1132  BP: (!) 108/55  Pulse: 68  Resp: 18  Temp: 98.4 F (36.9 C)  SpO2: 100%     FHT: 146 bpm Lab orders placed from triage:   UA

## 2022-06-24 NOTE — MAU Provider Note (Signed)
History     CSN: [id number]  Arrival date and time: 06/24/22 1112    Chief Complaint  Patient presents with   Pelvic Pain   HPI [name] who presents with intermittent pelvic pain that occurs randomly throughout the day with no palliating provoking factors. The pain lasts for a few seconds, then goes away, mainly in the center. Not worse with movements. No abnormal discharge. Being treated for BV.   OB History     Gravida  4   Para  2   Term  2   Preterm  0   AB  1   Living  2      SAB  1   IAB  0   Ectopic  0   Multiple  0   Live Births  2           Past Medical History:  Diagnosis Date   Hypothyroidism    Medical history non-contributory     Past Surgical History:  Procedure Laterality Date   BARIATRIC SURGERY  04/10/2021   CESAREAN SECTION     CESAREAN SECTION      Family History  Problem Relation Age of Onset   Hypertension Mother    Cancer Father    Hypertension Maternal Grandmother    Cancer Maternal Grandfather     Social History   Tobacco Use   Smoking status: Former    Types: Cigarettes    Quit date: 03/03/2016    Years since quitting: 6.3   Smokeless tobacco: Never  Vaping Use   Vaping Use: Never used  Substance Use Topics   Alcohol use: Not Currently   Drug use: No    Allergies: No Known Allergies  Medications Prior to Admission  Medication Sig Dispense Refill Last Dose   Blood Pressure Monitoring (BLOOD PRESSURE KIT) DEVI 1 Device by Does not apply route once a week. 1 each 0    Cholecalciferol 25 MCG (1000 UT) tablet Take by mouth.      levothyroxine (SYNTHROID) 75 MCG tablet Take 75 mcg by mouth daily.      metroNIDAZOLE (FLAGYL) 500 MG tablet Take 1 tablet (500 mg total) by mouth 2 (two) times daily. 14 tablet 0    Misc. Devices (GOJJI WEIGHT SCALE) MISC 1 Device by Does not apply route once a week. 1 each 0    Multiple Vitamins-Minerals (MULTIVITAMIN WITH MINERALS)  tablet Take 1 tablet by mouth daily. Bariatric      polyethylene glycol powder (GLYCOLAX/MIRALAX) 17 GM/SCOOP powder Take 1 Container by mouth daily as needed.      Prenatal Vit-Fe Fumarate-FA (PRENATAL VITAMINS) 28-0.8 MG TABS Take 1 tablet by mouth daily. (Patient not taking: Reported on 06/05/2022)       Review of Systems Physical Exam   Blood pressure (!) 108/55, pulse 68, temperature 98.4 F (36.9 C), temperature source Oral, resp. rate 18, height _0  (1.753 m), weight 100.1 kg, last menstrual period 03/13/2022, SpO2 100 %, unknown if currently breastfeeding.  Physical Exam Vitals reviewed.  Constitutional:      Appearance: Normal appearance.  Abdominal:     General: Abdomen is flat. There is no distension.     Palpations: Abdomen is soft.     Tenderness: There is no abdominal tenderness. There is no guarding.  Skin:    General: Skin is warm and dry.     Capillary Refill: Capillary refill takes less than 2 seconds.  Neurological:  General: No focal deficit present.     Mental Status: She is alert.  Psychiatric:        Mood and Affect: Mood normal.        Behavior: Behavior normal.        Thought Content: Thought content normal.        Judgment: Judgment normal.    Results for orders placed or performed during the hospital encounter of 06/24/22 (from the past 24 hour(s))  Wet prep, genital     Status: None   Collection Time: 06/24/22 11:55 AM   Specimen: Vaginal  Result Value Ref Range   Yeast Wet Prep HPF POC NONE SEEN NONE SEEN   Trich, Wet Prep NONE SEEN NONE SEEN   Clue Cells Wet Prep HPF POC NONE SEEN NONE SEEN   WBC, Wet Prep HPF POC <10 <10   Sperm NONE SEEN   Urinalysis, Routine w reflex microscopic Urine, Clean Catch     Status: Abnormal   Collection Time: 06/24/22 11:58 AM  Result Value Ref Range   Color, Urine AMBER (A) YELLOW   APPearance HAZY (A) CLEAR   Specific Gravity, Urine 1.023 1.005 - 1.030   pH 5.0 5.0 - 8.0   Glucose, UA NEGATIVE NEGATIVE  mg/dL   Hgb urine dipstick NEGATIVE NEGATIVE   Bilirubin Urine NEGATIVE NEGATIVE   Ketones, ur NEGATIVE NEGATIVE mg/dL   Protein, ur NEGATIVE NEGATIVE mg/dL   Nitrite NEGATIVE NEGATIVE   Leukocytes,Ua NEGATIVE NEGATIVE    MAU Course  Procedures  MDM  Assessment and Plan   1. Supervision of high risk pregnancy, antepartum   2. Alpha thalassemia silent carrier   3. [redacted] weeks gestation of pregnancy   4. Pelvic cramping    No evidence of infection.  UCx sent.  F/u in office.  Truett Mainland 06/24/2022, 12:02 PM

## 2022-06-25 LAB — CULTURE, OB URINE: Culture: NO GROWTH

## 2022-06-25 LAB — GC/CHLAMYDIA PROBE AMP (~~LOC~~) NOT AT ARMC
Chlamydia: NEGATIVE
Comment: NEGATIVE
Comment: NORMAL
Neisseria Gonorrhea: NEGATIVE

## 2022-07-05 ENCOUNTER — Inpatient Hospital Stay (HOSPITAL_COMMUNITY)
Admission: AD | Admit: 2022-07-05 | Discharge: 2022-07-05 | Disposition: A | Discharge status: Home / Self Care | Payer: Medicaid Other | Attending: Obstetrics and Gynecology | Admitting: Obstetrics and Gynecology

## 2022-07-05 DIAGNOSIS — O2342 Unspecified infection of urinary tract in pregnancy, second trimester: Secondary | ICD-10-CM | POA: Diagnosis present

## 2022-07-05 DIAGNOSIS — Z3A16 16 weeks gestation of pregnancy: Secondary | ICD-10-CM | POA: Insufficient documentation

## 2022-07-05 DIAGNOSIS — R309 Painful micturition, unspecified: Secondary | ICD-10-CM | POA: Insufficient documentation

## 2022-07-05 DIAGNOSIS — M546 Pain in thoracic spine: Secondary | ICD-10-CM | POA: Insufficient documentation

## 2022-07-05 DIAGNOSIS — O26892 Other specified pregnancy related conditions, second trimester: Secondary | ICD-10-CM | POA: Diagnosis not present

## 2022-07-05 LAB — CBC WITH DIFFERENTIAL/PLATELET
Abs Immature Granulocytes: 0.07 10*3/uL (ref 0.00–0.07)
Basophils Absolute: 0.1 10*3/uL (ref 0.0–0.1)
Basophils Relative: 1 %
Eosinophils Absolute: 0.3 10*3/uL (ref 0.0–0.5)
Eosinophils Relative: 4 %
HCT: 38.4 % (ref 36.0–46.0)
Hemoglobin: 12.4 g/dL (ref 12.0–15.0)
Immature Granulocytes: 1 %
Lymphocytes Relative: 17 %
Lymphs Abs: 1.4 10*3/uL (ref 0.7–4.0)
MCH: 26.9 pg (ref 26.0–34.0)
MCHC: 32.3 g/dL (ref 30.0–36.0)
MCV: 83.3 fL (ref 80.0–100.0)
Monocytes Absolute: 0.5 10*3/uL (ref 0.1–1.0)
Monocytes Relative: 6 %
Neutro Abs: 5.9 10*3/uL (ref 1.7–7.7)
Neutrophils Relative %: 71 %
Platelets: 302 10*3/uL (ref 150–400)
RBC: 4.61 MIL/uL (ref 3.87–5.11)
RDW: 13.5 % (ref 11.5–15.5)
WBC: 8.3 10*3/uL (ref 4.0–10.5)
nRBC: 0 % (ref 0.0–0.2)

## 2022-07-05 LAB — URINALYSIS, ROUTINE W REFLEX MICROSCOPIC
Bilirubin Urine: NEGATIVE
Glucose, UA: NEGATIVE mg/dL
Hgb urine dipstick: NEGATIVE
Ketones, ur: NEGATIVE mg/dL
Leukocytes,Ua: NEGATIVE
Nitrite: NEGATIVE
Protein, ur: NEGATIVE mg/dL
Specific Gravity, Urine: 1.021 (ref 1.005–1.030)
pH: 5 (ref 5.0–8.0)

## 2022-07-05 MED ORDER — CEPHALEXIN 500 MG PO CAPS: 1000.0000 mg | ORAL_CAPSULE | Freq: Two times a day (BID) | ORAL | 0 refills | Status: DC

## 2022-07-05 MED ORDER — CEPHALEXIN 500 MG PO CAPS
500.0000 mg | ORAL_CAPSULE | Freq: Once | ORAL | Status: AC
  Administered 2022-07-05: 500 mg via ORAL
  Filled 2022-07-05: qty 1

## 2022-07-05 NOTE — MAU Provider Note (Signed)
History     CSN: [id number]  Arrival date and time: 07/05/22 1404   Event Date/Time   First Provider Initiated Contact with Patient 07/05/22 1451      Chief Complaint  Patient presents with   Urinary Tract Infection   Heather Kelly, a  41 y.o. [id number] at 10w2dpresents to MAU with complaints of painful urination that started 2 days ago. Denies Burning during urination, but notes afterwards feeling intense pelvic pressure. She currently rates pain a 3/10. Denies attempting to relieve symptoms at home.  She endorses dark urine frequency and urgency but denies hematuria. She also denies flank pain, but is having some mild mid back pain she thinks is related to a pulled muscle. Denies vaginal bleeding, leaking of fluid. Feeling occasional flutters. FHT 142 obtained in triage.   Urinary Tract Infection  Associated symptoms include frequency and urgency. Pertinent negatives include no chills, flank pain, hematuria, nausea or vomiting.    OB History     Gravida  4   Para  2   Term  2   Preterm  0   AB  1   Living  2      SAB  1   IAB  0   Ectopic  0   Multiple  0   Live Births  2           Past Medical History:  Diagnosis Date   Hypothyroidism    Medical history non-contributory     Past Surgical History:  Procedure Laterality Date   BARIATRIC SURGERY  04/10/2021   CESAREAN SECTION     CESAREAN SECTION      Family History  Problem Relation Age of Onset   Hypertension Mother    Cancer Father    Hypertension Maternal Grandmother    Cancer Maternal Grandfather     Social History   Tobacco Use   Smoking status: Former    Types: Cigarettes    Quit date: 03/03/2016    Years since quitting: 6.3   Smokeless tobacco: Never  Vaping Use   Vaping Use: Never used  Substance Use Topics   Alcohol use: Not Currently   Drug use: No    Allergies: No Known Allergies  Medications Prior to Admission  Medication Sig Dispense Refill Last Dose   Blood  Pressure Monitoring (BLOOD PRESSURE KIT) DEVI 1 Device by Does not apply route once a week. 1 each 0    Cholecalciferol 25 MCG (1000 UT) tablet Take by mouth.      levothyroxine (SYNTHROID) 75 MCG tablet Take 75 mcg by mouth daily.      metroNIDAZOLE (FLAGYL) 500 MG tablet Take 1 tablet (500 mg total) by mouth 2 (two) times daily. 14 tablet 0    Misc. Devices (GOJJI WEIGHT SCALE) MISC 1 Device by Does not apply route once a week. 1 each 0    Multiple Vitamins-Minerals (MULTIVITAMIN WITH MINERALS) tablet Take 1 tablet by mouth daily. Bariatric      polyethylene glycol powder (GLYCOLAX/MIRALAX) 17 GM/SCOOP powder Take 1 Container by mouth daily as needed.      Prenatal Vit-Fe Fumarate-FA (PRENATAL VITAMINS) 28-0.8 MG TABS Take 1 tablet by mouth daily. (Patient not taking: Reported on 06/05/2022)       Review of Systems  Constitutional:  Negative for chills, fatigue and fever.  Respiratory:  Negative for shortness of breath, wheezing and stridor.   Gastrointestinal:  Negative for abdominal pain, diarrhea, nausea and vomiting.  Genitourinary:  Positive for  dysuria, frequency, pelvic pain and urgency. Negative for difficulty urinating, flank pain, hematuria, vaginal bleeding, vaginal discharge and vaginal pain.  Musculoskeletal:  Positive for back pain.  Neurological:  Negative for dizziness, light-headedness and headaches.   Physical Exam   Blood pressure 114/62, pulse 71, temperature 98.2 F (36.8 C), resp. rate 18, height _0  (1.753 m), weight 100 kg, last menstrual period 03/13/2022, unknown if currently breastfeeding.  Physical Exam Vitals and nursing note reviewed.  Constitutional:      General: She is not in acute distress.    Appearance: Normal appearance.  HENT:     Head: Normocephalic.  Cardiovascular:     Rate and Rhythm: Normal rate.  Pulmonary:     Effort: Pulmonary effort is normal.  Abdominal:     Palpations: Abdomen is soft.     Tenderness: There is no abdominal  tenderness.  Musculoskeletal:     Cervical back: Normal range of motion.  Skin:    General: Skin is warm and dry.  Neurological:     Mental Status: She is alert and oriented to person, place, and time.  Psychiatric:        Mood and Affect: Mood normal.     MAU Course  Procedures Lab Orders         Culture, OB Urine         Urinalysis, Routine w reflex microscopic         CBC with Differential/Platelet    Results for orders placed or performed during the hospital encounter of 07/05/22 (from the past 24 hour(s))  CBC with Differential/Platelet     Status: None   Collection Time: 07/05/22  2:49 PM  Result Value Ref Range   WBC 8.3 4.0 - 10.5 K/uL   RBC 4.61 3.87 - 5.11 MIL/uL   Hemoglobin 12.4 12.0 - 15.0 g/dL   HCT 38.4 36.0 - 46.0 %   MCV 83.3 80.0 - 100.0 fL   MCH 26.9 26.0 - 34.0 pg   MCHC 32.3 30.0 - 36.0 g/dL   RDW 13.5 11.5 - 15.5 %   Platelets 302 150 - 400 K/uL   nRBC 0.0 0.0 - 0.2 %   Neutrophils Relative % 71 %   Neutro Abs 5.9 1.7 - 7.7 K/uL   Lymphocytes Relative 17 %   Lymphs Abs 1.4 0.7 - 4.0 K/uL   Monocytes Relative 6 %   Monocytes Absolute 0.5 0.1 - 1.0 K/uL   Eosinophils Relative 4 %   Eosinophils Absolute 0.3 0.0 - 0.5 K/uL   Basophils Relative 1 %   Basophils Absolute 0.1 0.0 - 0.1 K/uL   Immature Granulocytes 1 %   Abs Immature Granulocytes 0.07 0.00 - 0.07 K/uL  Urinalysis, Routine w reflex microscopic     Status: Abnormal   Collection Time: 07/05/22  2:53 PM  Result Value Ref Range   Color, Urine YELLOW YELLOW   APPearance HAZY (A) CLEAR   Specific Gravity, Urine 1.021 1.005 - 1.030   pH 5.0 5.0 - 8.0   Glucose, UA NEGATIVE NEGATIVE mg/dL   Hgb urine dipstick NEGATIVE NEGATIVE   Bilirubin Urine NEGATIVE NEGATIVE   Ketones, ur NEGATIVE NEGATIVE mg/dL   Protein, ur NEGATIVE NEGATIVE mg/dL   Nitrite NEGATIVE NEGATIVE   Leukocytes,Ua NEGATIVE NEGATIVE   Meds ordered this encounter  Medications   cephALEXin (KEFLEX) capsule 500 mg    cephALEXin (KEFLEX) 500 MG capsule    Sig: Take 2 capsules (1,000 mg total) by mouth 2 (two) times  daily.    Dispense:  20 capsule    Refill:  0    Order Specific Question:   Supervising Provider    Answer:   Merrily Pew    MDM Lab results read and interpreted by me.   - Patient is afebrile,  - No leukocytes, or hematuria in urine, No  WBC, no nitrites, no flank pain, and no CVA tenderness.  -Low Suspicion for Pyelonephritis.  - Urine reflexed to Culture  - Keflex for UTI course initiated in MAU.  Assessment and Plan   1. Painful urination   2. [redacted] weeks gestation of pregnancy   - Discussed possibly UTI in pregnancy.  - PO Keflex sent to outpatient pharmacy.  - Worsening symptoms and precautions provided.  - Preterm labor precautions provided.  - Patient discharged home in stable condition and may return to MAU as needed.   Heather Kelly 07/05/2022, 2:51 PM

## 2022-07-05 NOTE — MAU Note (Signed)
.  Heather Kelly is a 41 y.o. at [redacted]w[redacted]d here in MAU reporting: Heather Kelly has been having pelvic pressure and burning with urination and frequency x 2 days. Thinks Heather Kelly may have a UTI. Denies any fever.  LMP:  Onset of complaint: 2 days. Pain score: 3 There were no vitals filed for this visit.   FHT:142 Lab orders placed from triage:  U/A

## 2022-07-07 LAB — CULTURE, OB URINE: Culture: NO GROWTH

## 2022-07-08 ENCOUNTER — Telehealth: Payer: Self-pay | Admitting: Emergency Medicine

## 2022-07-08 ENCOUNTER — Encounter: Payer: Self-pay | Admitting: Advanced Practice Midwife

## 2022-07-08 ENCOUNTER — Ambulatory Visit (INDEPENDENT_AMBULATORY_CARE_PROVIDER_SITE_OTHER): Payer: Medicaid Other | Admitting: Advanced Practice Midwife

## 2022-07-08 VITALS — BP 114/77 | HR 67 | Wt 223.4 lb

## 2022-07-08 DIAGNOSIS — E039 Hypothyroidism, unspecified: Secondary | ICD-10-CM

## 2022-07-08 DIAGNOSIS — O0990 Supervision of high risk pregnancy, unspecified, unspecified trimester: Secondary | ICD-10-CM

## 2022-07-08 DIAGNOSIS — Z3A16 16 weeks gestation of pregnancy: Secondary | ICD-10-CM

## 2022-07-08 DIAGNOSIS — O09529 Supervision of elderly multigravida, unspecified trimester: Secondary | ICD-10-CM

## 2022-07-08 DIAGNOSIS — O99281 Endocrine, nutritional and metabolic diseases complicating pregnancy, first trimester: Secondary | ICD-10-CM

## 2022-07-08 NOTE — Progress Notes (Signed)
Pt unsure if feeling fetal movement yet. Reports some pressure.  Pt wants to discuss GTT options and wants to make sure drink is cold on day of GTT.

## 2022-07-08 NOTE — Progress Notes (Signed)
   PRENATAL VISIT NOTE  Subjective:  Heather Kelly is a 41 y.o. [id number] at [redacted]w[redacted]d being seen today for ongoing prenatal care.  She is currently monitored for the following issues for this high-risk pregnancy and has Supervision of high risk pregnancy, antepartum; AMA (advanced maternal age) multigravida 35+; History of 2 cesarean deliveries, currently pregnant; Hypothyroidism affecting pregnancy; and Alpha thalassemia silent carrier on their problem list.  Patient reports no complaints.  Contractions: Not present. Vag. Bleeding: None.   . Denies leaking of fluid.   The following portions of the patient's history were reviewed and updated as appropriate: allergies, current medications, past family history, past medical history, past social history, past surgical history and problem list.   Objective:   Vitals:   07/08/22 1448  BP: 114/77  Pulse: 67  Weight: 223 lb 6.4 oz (101.3 kg)    Fetal Status: Fetal Heart Rate (bpm): 153         General:  Alert, oriented and cooperative. Patient is in no acute distress.  Skin: Skin is warm and dry. No rash noted.   Cardiovascular: Normal heart rate noted  Respiratory: Normal respiratory effort, no problems with respiration noted  Abdomen: Soft, gravid, appropriate for gestational age.  Pain/Pressure: Present     Pelvic: Cervical exam deferred        Extremities: Normal range of motion.  Edema: None  Mental Status: Normal mood and affect. Normal behavior. Normal judgment and thought content.   Assessment and Plan:  Pregnancy: [id number] at [redacted]w[redacted]d 1. Supervision of high risk pregnancy, antepartum --HROB for AMA and hypothyroid --Pt desires VBAC after 2 previous c-sections, had first after IOL and 4 hours of pushing, second was scheduled because OB in [place] did not recommend VBAC.  Discussed increased risks with TOLAC after 2 prior c/s. Pt will need to begin labor on her own.    - AFP, Serum, Open Spina Bifida  2. [redacted] weeks gestation of pregnancy  3.  Hypothyroidism affecting pregnancy in first trimester --Normal T4 on 06/05/22  4. Antepartum multigravida of advanced maternal age    Preterm labor symptoms and general obstetric precautions including but not limited to vaginal bleeding, contractions, leaking of fluid and fetal movement were reviewed in detail with the patient. Please refer to After Visit Summary for other counseling recommendations.   Return in about 4 weeks (around 08/05/2022) for Midwife preferred.  Future Appointments  Date Time Provider Department Center  07/24/2022 10:15 AM [facility] NURSE [facility]  07/24/2022 10:30 AM [facility] US3 [facility]  08/08/2022  1:30 PM Raelyn Mora, CNM [facility] None    Sharen Counter, CNM

## 2022-07-08 NOTE — Telephone Encounter (Signed)
RC to patient, following voicemail on nurse line. Patient states she no longer needs assistance.

## 2022-07-09 ENCOUNTER — Other Ambulatory Visit (HOSPITAL_BASED_OUTPATIENT_CLINIC_OR_DEPARTMENT_OTHER): Payer: Self-pay | Admitting: Advanced Practice Midwife

## 2022-07-09 DIAGNOSIS — O0990 Supervision of high risk pregnancy, unspecified, unspecified trimester: Secondary | ICD-10-CM

## 2022-07-09 MED ORDER — PRENATE MINI 18-0.6-0.4-350 MG PO CAPS: 1.0000 | ORAL_CAPSULE | Freq: Every day | ORAL | 11 refills | Status: DC

## 2022-07-24 ENCOUNTER — Ambulatory Visit: Payer: Medicaid Other

## 2022-07-26 ENCOUNTER — Encounter: Payer: Self-pay | Admitting: *Deleted

## 2022-07-26 ENCOUNTER — Ambulatory Visit: Payer: Medicaid Other | Admitting: *Deleted

## 2022-07-26 ENCOUNTER — Ambulatory Visit: Payer: Medicaid Other | Attending: Student

## 2022-07-26 ENCOUNTER — Other Ambulatory Visit: Payer: Self-pay | Admitting: *Deleted

## 2022-07-26 VITALS — BP 115/43 | HR 79

## 2022-07-26 DIAGNOSIS — O99212 Obesity complicating pregnancy, second trimester: Secondary | ICD-10-CM

## 2022-07-26 DIAGNOSIS — D563 Thalassemia minor: Secondary | ICD-10-CM | POA: Insufficient documentation

## 2022-07-26 DIAGNOSIS — O0990 Supervision of high risk pregnancy, unspecified, unspecified trimester: Secondary | ICD-10-CM

## 2022-07-26 DIAGNOSIS — E039 Hypothyroidism, unspecified: Secondary | ICD-10-CM

## 2022-07-26 DIAGNOSIS — O09522 Supervision of elderly multigravida, second trimester: Secondary | ICD-10-CM

## 2022-07-26 NOTE — Progress Notes (Unsigned)
Us/

## 2022-08-05 IMAGING — US US THYROID
1 series · 14 of 25 positions shown · non-contrast
Comparison: None.

CLINICAL DATA: Hypothyroid.

EXAM:
THYROID ULTRASOUND
TECHNIQUE: Ultrasound examination of the thyroid gland and adjacent soft
tissues was performed.

[Series 1: us thyroid · 0.05mm/px · 14 of 43 slices shown]
[im 1/43]
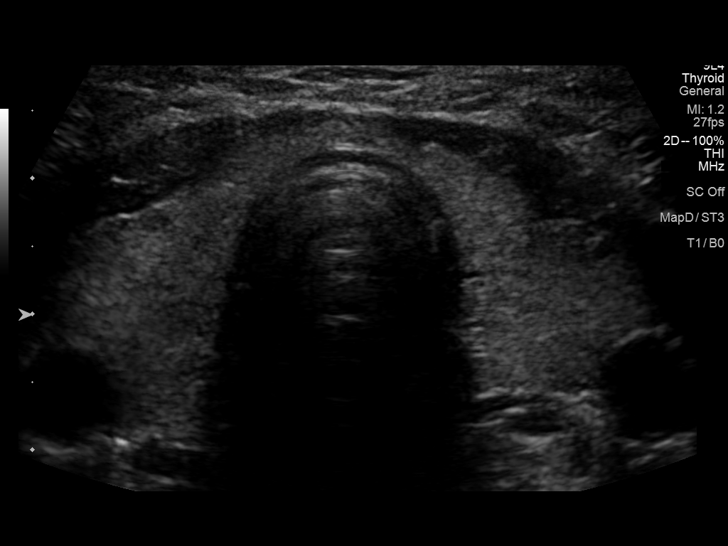
[im 4/43]
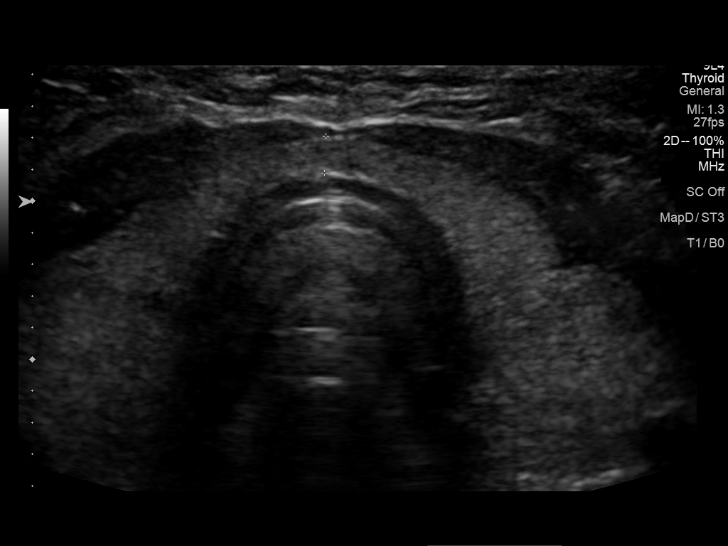
[im 8/43]
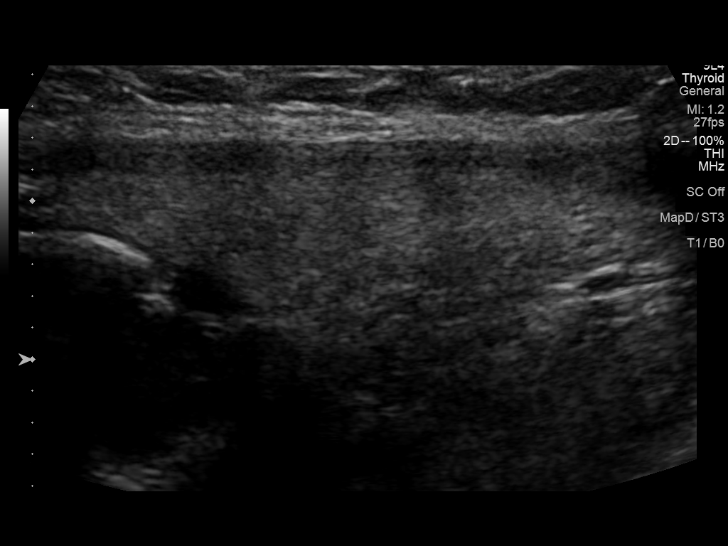
[im 11/43]
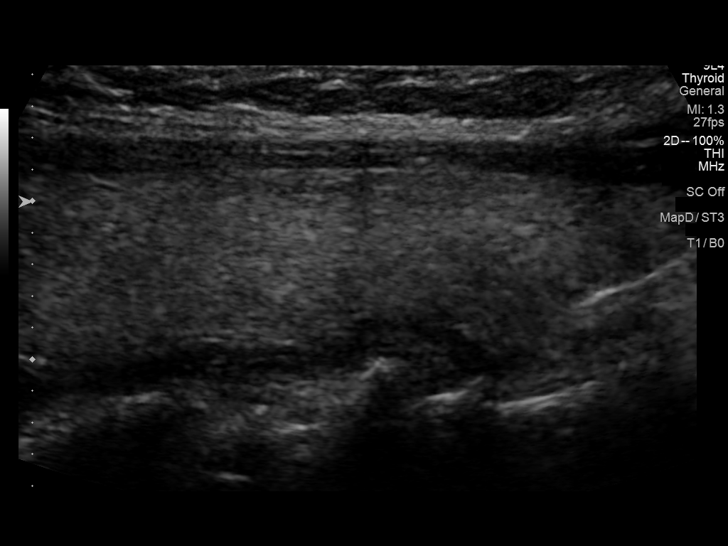
[im 15/43]
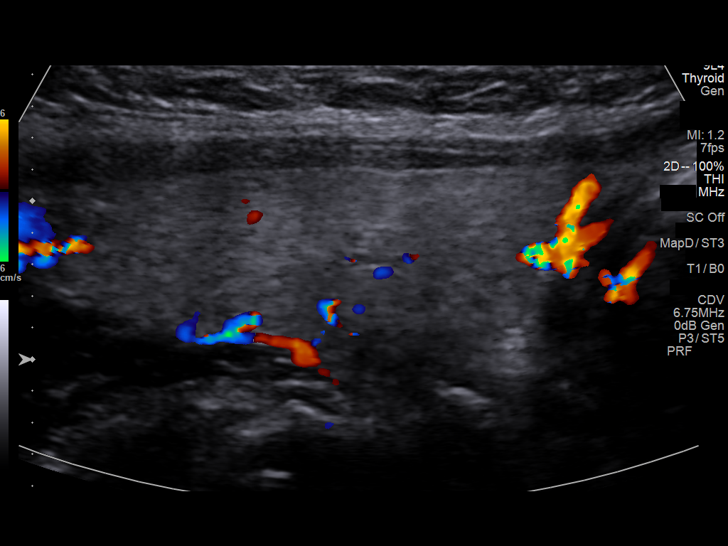
[im 16/43]
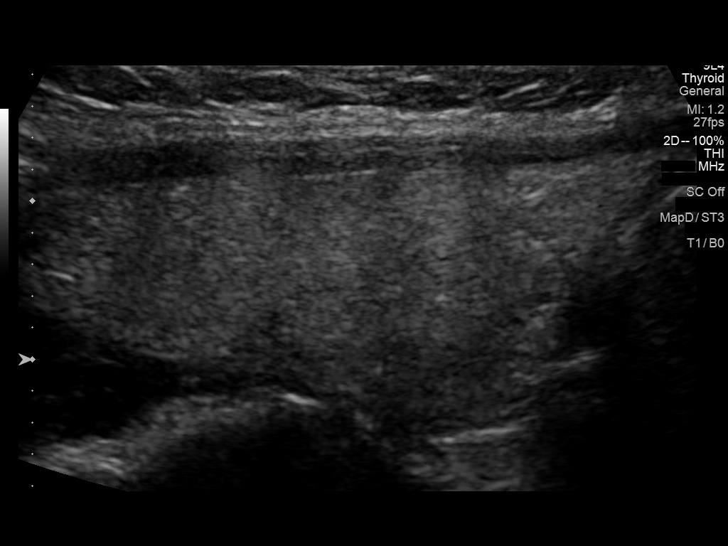
[im 20/43]
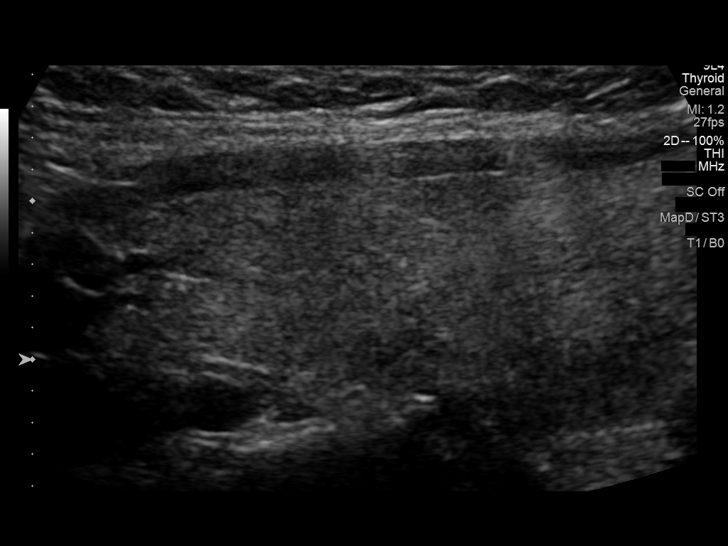
[im 23/43]
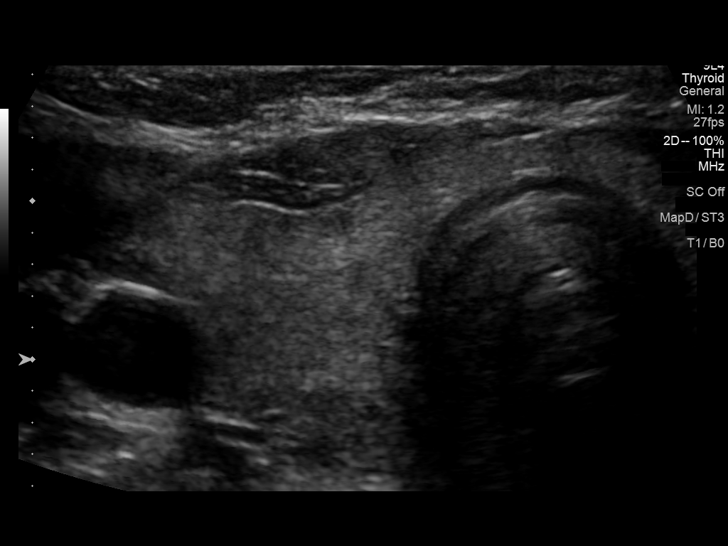
[im 27/43]
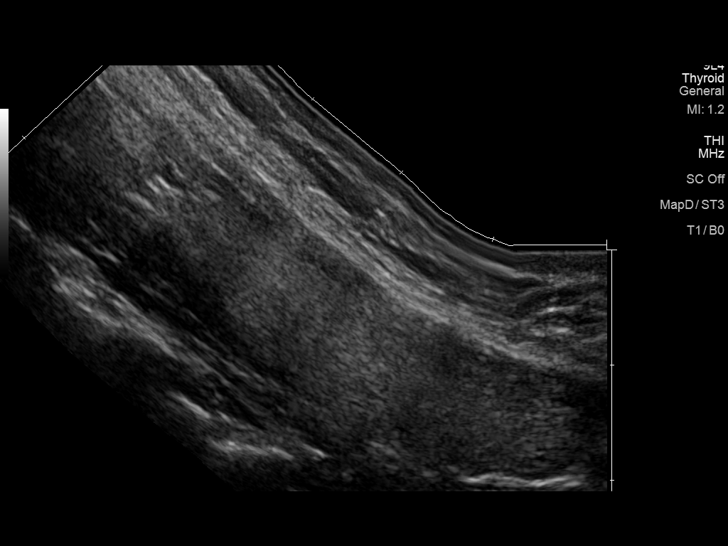
[im 29/43]
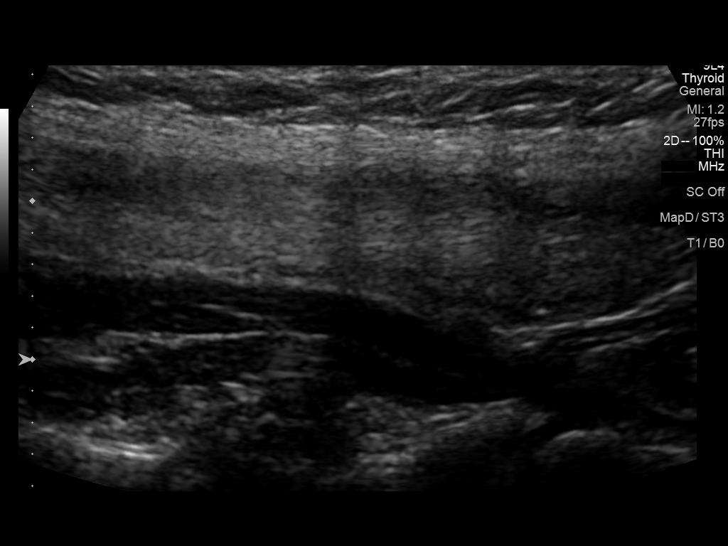
[im 32/43]
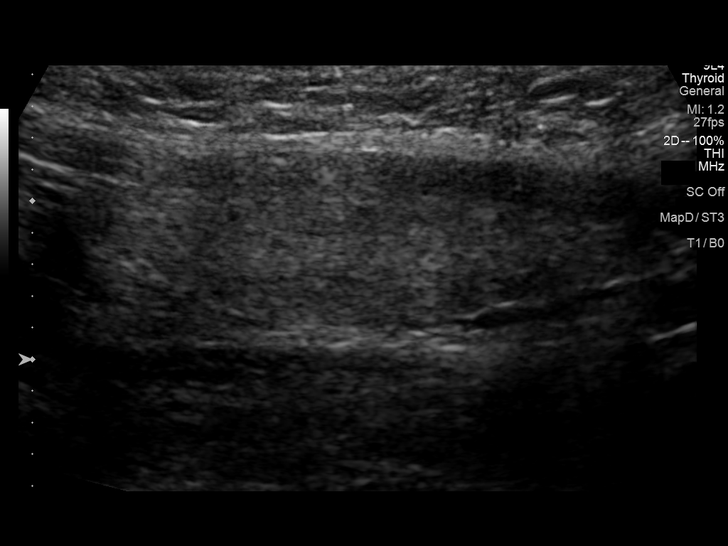
[im 36/43]
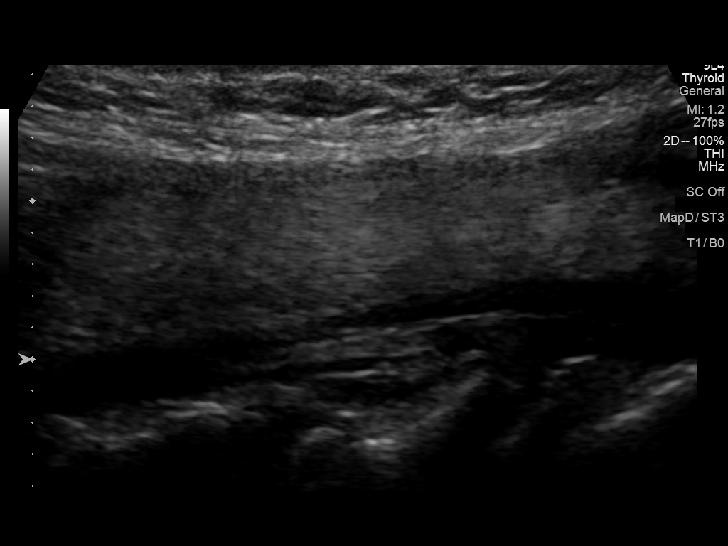
[im 39/43]
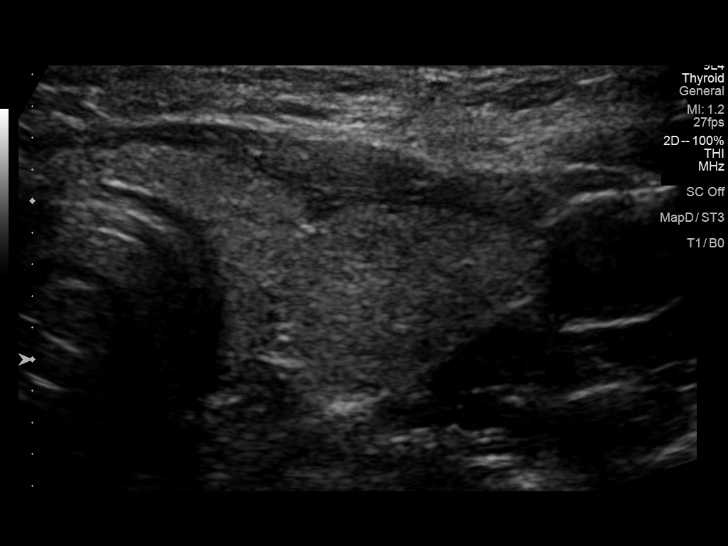
[im 43/43]
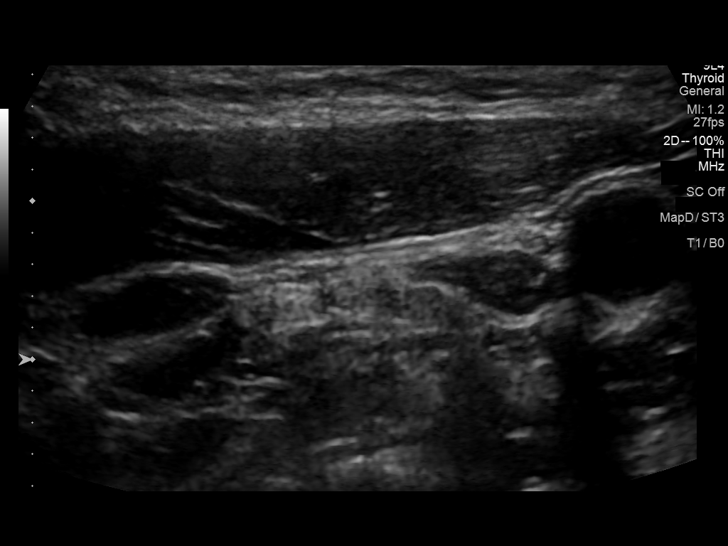

[14 of 25 positions shown; findings below may reference images not displayed]

FINDINGS: Parenchymal Echotexture: Mildly heterogenous

Isthmus: 0.2 cm

Right lobe: 4.8 x 1.9 x 1.9 cm

Left lobe: 4.1 x 1.3 x 2.0 cm

_________________________________________________________

Estimated total number of nodules >/= 1 cm: 0

Number of spongiform nodules >/=  2 cm not described below (TR1): 0

Number of mixed cystic and solid nodules >/= 1.5 cm not described
below (TR2): 0

_________________________________________________________

No discrete nodules are seen within the thyroid gland. This does NOT
meet TI-RADS criteria for biopsy or dedicated follow-up.

The above is in keeping with the ACR TI-RADS recommendations - [HOSPITAL] 4066;[DATE].

No cervical adenopathy or abnormal fluid collection within the
imaged neck.
IMPRESSION: Normal, nonenlarged thyroid without discrete nodule.

## 2022-08-08 ENCOUNTER — Ambulatory Visit (INDEPENDENT_AMBULATORY_CARE_PROVIDER_SITE_OTHER): Payer: Medicaid Other | Admitting: Obstetrics and Gynecology

## 2022-08-08 ENCOUNTER — Encounter: Payer: Self-pay | Admitting: Obstetrics and Gynecology

## 2022-08-08 VITALS — BP 120/70 | HR 85 | Wt 228.0 lb

## 2022-08-08 DIAGNOSIS — Z3A22 22 weeks gestation of pregnancy: Secondary | ICD-10-CM

## 2022-08-08 DIAGNOSIS — Z6830 Body mass index (BMI) 30.0-30.9, adult: Secondary | ICD-10-CM | POA: Insufficient documentation

## 2022-08-08 DIAGNOSIS — O0990 Supervision of high risk pregnancy, unspecified, unspecified trimester: Secondary | ICD-10-CM

## 2022-08-08 DIAGNOSIS — O34219 Maternal care for unspecified type scar from previous cesarean delivery: Secondary | ICD-10-CM

## 2022-08-08 DIAGNOSIS — O99210 Obesity complicating pregnancy, unspecified trimester: Secondary | ICD-10-CM

## 2022-08-08 DIAGNOSIS — O0992 Supervision of high risk pregnancy, unspecified, second trimester: Secondary | ICD-10-CM

## 2022-08-08 DIAGNOSIS — O99212 Obesity complicating pregnancy, second trimester: Secondary | ICD-10-CM

## 2022-08-08 MED ORDER — ASPIRIN 81 MG PO CHEW: 81.0000 mg | CHEWABLE_TABLET | Freq: Every day | ORAL | 6 refills | Status: DC

## 2022-08-08 NOTE — Progress Notes (Signed)
   LOW-RISK PREGNANCY OFFICE VISIT Patient name: Heather Kelly MRN [id number]  Date of birth: 1981-07-29 Chief Complaint:   Routine Prenatal Visit  History of Present Illness:   Heather Kelly is a 41 y.o. [id number] female at [redacted]w[redacted]d with an Estimated Date of Delivery: 12/18/22 being seen today for ongoing management of a low-risk pregnancy.  Today she reports  questions about IOL and what methods could be used for her since she plans a TOLAC . Contractions: Irritability. Vag. Bleeding: None.  Movement: Present. denies leaking of fluid. Review of Systems:   Pertinent items are noted in HPI Denies abnormal vaginal discharge w/ itching/odor/irritation, headaches, visual changes, shortness of breath, chest pain, abdominal pain, severe nausea/vomiting, or problems with urination or bowel movements unless otherwise stated above. Pertinent History Reviewed:  Reviewed past medical,surgical, social, obstetrical and family history.  Reviewed problem list, medications and allergies. Physical Assessment:   Vitals:   08/08/22 1330  BP: 120/70  Pulse: 85  Weight: 228 lb (103.4 kg)  Body mass index is 33.67 kg/m.        Physical Examination:   General appearance: Well appearing, and in no distress  Mental status: Alert, oriented to person, place, and time  Skin: Warm & dry  Cardiovascular: Normal heart rate noted  Respiratory: Normal respiratory effort, no distress  Abdomen: Soft, gravid, nontender  Pelvic: Cervical exam deferred         Extremities: Edema: None  Fetal Status: Fetal Heart Rate (bpm): 159 Fundal Height: 22 cm Movement: Present    No results found for this or any previous visit (from the past 24 hour(s)).  Assessment & Plan:  1) Low-risk pregnancy [id number] at [redacted]w[redacted]d with an Estimated Date of Delivery: 12/18/22   2) Supervision of high risk pregnancy, antepartum  - AFP, Serum, Open Spina Bifida - Discussed IOL methods in case that option is determined to be best for her at that time -  Has doula, Baird Lyons Mapp-Ahmed - Plans to keep placenta - Advised to purchase medium to large sized styrofoam cooler to transport from hospital.  3) Obesity affecting pregnancy, antepartum  - Rx for aspirin 81 MG chewable tablet daily sent  4) History of 2 cesarean deliveries, currently pregnant - Planning TOLAC  5) [redacted] weeks gestation of pregnancy    Meds: No orders of the defined types were placed in this encounter.  Labs/procedures today: AFP  Plan:  Continue routine obstetrical care   Reviewed: Preterm labor symptoms and general obstetric precautions including but not limited to vaginal bleeding, contractions, leaking of fluid and fetal movement were reviewed in detail with the patient.  All questions were answered. Has home bp cuff. Check bp weekly, let us know if >140/90.   Follow-up: Return in about 4 weeks (around 09/05/2022) for Return OB visit.  No orders of the defined types were placed in this encounter.  Raelyn Mora MSN, CNM 08/08/2022 2:15 PM

## 2022-08-08 NOTE — Progress Notes (Signed)
Pt presents for ROB visit. Pt has c/o about birth plan regarding induction.

## 2022-08-14 LAB — AFP, SERUM, OPEN SPINA BIFIDA
AFP MoM: 1.57
AFP Value: 90.4 ng/mL
Gest. Age on Collection Date: 21.1 weeks
Maternal Age At EDD: 39.5 yr
OSBR Risk 1 IN: 4541
Test Results:: NEGATIVE
Weight: 228 [lb_av]

## 2022-08-23 ENCOUNTER — Ambulatory Visit: Payer: Medicaid Other | Admitting: *Deleted

## 2022-08-23 ENCOUNTER — Other Ambulatory Visit: Payer: Self-pay | Admitting: *Deleted

## 2022-08-23 ENCOUNTER — Ambulatory Visit: Payer: Medicaid Other | Attending: Maternal & Fetal Medicine

## 2022-08-23 ENCOUNTER — Encounter: Payer: Self-pay | Admitting: *Deleted

## 2022-08-23 VITALS — BP 117/58 | HR 68

## 2022-08-23 DIAGNOSIS — O0990 Supervision of high risk pregnancy, unspecified, unspecified trimester: Secondary | ICD-10-CM | POA: Diagnosis present

## 2022-08-23 DIAGNOSIS — O34219 Maternal care for unspecified type scar from previous cesarean delivery: Secondary | ICD-10-CM

## 2022-08-23 DIAGNOSIS — E039 Hypothyroidism, unspecified: Secondary | ICD-10-CM | POA: Insufficient documentation

## 2022-08-23 DIAGNOSIS — O285 Abnormal chromosomal and genetic finding on antenatal screening of mother: Secondary | ICD-10-CM | POA: Diagnosis not present

## 2022-08-23 DIAGNOSIS — D563 Thalassemia minor: Secondary | ICD-10-CM | POA: Insufficient documentation

## 2022-08-23 DIAGNOSIS — O99212 Obesity complicating pregnancy, second trimester: Secondary | ICD-10-CM | POA: Insufficient documentation

## 2022-08-23 DIAGNOSIS — O09522 Supervision of elderly multigravida, second trimester: Secondary | ICD-10-CM | POA: Diagnosis not present

## 2022-08-23 DIAGNOSIS — O99282 Endocrine, nutritional and metabolic diseases complicating pregnancy, second trimester: Secondary | ICD-10-CM | POA: Insufficient documentation

## 2022-08-23 DIAGNOSIS — E669 Obesity, unspecified: Secondary | ICD-10-CM

## 2022-08-23 DIAGNOSIS — Z3A23 23 weeks gestation of pregnancy: Secondary | ICD-10-CM

## 2022-09-05 ENCOUNTER — Ambulatory Visit (INDEPENDENT_AMBULATORY_CARE_PROVIDER_SITE_OTHER): Payer: Medicaid Other

## 2022-09-05 VITALS — BP 120/63 | HR 75 | Wt 235.0 lb

## 2022-09-05 DIAGNOSIS — O09522 Supervision of elderly multigravida, second trimester: Secondary | ICD-10-CM

## 2022-09-05 DIAGNOSIS — O0992 Supervision of high risk pregnancy, unspecified, second trimester: Secondary | ICD-10-CM

## 2022-09-05 DIAGNOSIS — Z3A25 25 weeks gestation of pregnancy: Secondary | ICD-10-CM

## 2022-09-05 DIAGNOSIS — O34219 Maternal care for unspecified type scar from previous cesarean delivery: Secondary | ICD-10-CM

## 2022-09-05 DIAGNOSIS — O99210 Obesity complicating pregnancy, unspecified trimester: Secondary | ICD-10-CM

## 2022-09-05 DIAGNOSIS — O09529 Supervision of elderly multigravida, unspecified trimester: Secondary | ICD-10-CM

## 2022-09-05 DIAGNOSIS — O0990 Supervision of high risk pregnancy, unspecified, unspecified trimester: Secondary | ICD-10-CM

## 2022-09-05 DIAGNOSIS — O99212 Obesity complicating pregnancy, second trimester: Secondary | ICD-10-CM

## 2022-09-05 NOTE — Progress Notes (Signed)
Pt reports fetal movement, denies pain.  

## 2022-09-05 NOTE — Progress Notes (Signed)
[facility] PREGNANCY OFFICE VISIT  Patient name: Heather Kelly MRN [id number]  Date of birth: [birth year] Chief Complaint:   Routine Prenatal Visit  Subjective:   Heather Kelly is a 41 y.o. [id number] female at [redacted]w[redacted]d with an Estimated Date of Delivery: 12/18/22 being seen today for ongoing management of a high-risk pregnancy aeb has Supervision of high risk pregnancy, antepartum; AMA (advanced maternal age) multigravida 21+; History of 2 cesarean deliveries, currently pregnant; Hypothyroidism affecting pregnancy; Alpha thalassemia silent carrier; and Obesity affecting pregnancy, antepartum on their problem list.  Patient presents today with fatigue.  Patient endorses fetal movement. Patient denies abdominal cramping or contractions.  Patient denies vaginal concerns including abnormal discharge, leaking of fluid, and bleeding.  Contractions: Not present. Vag. Bleeding: None.  Movement: Present.  Reviewed past medical,surgical, social, obstetrical and family history as well as problem list, medications and allergies.  Objective   Vitals:   09/05/22 1406  BP: 120/63  Pulse: 75  Weight: 235 lb (106.6 kg)  Body mass index is 34.7 kg/m.  Total Weight Gain:28 lb (12.7 kg)         Physical Examination:   General appearance: Well appearing, and in no distress  Mental status: Alert, oriented to person, place, and time  Skin: Warm & dry  Cardiovascular: Normal heart rate noted  Respiratory: Normal respiratory effort, no distress  Abdomen: Soft, gravid, nontender, AGA with Fundal Height: 30 cm  Pelvic: Cervical exam deferred           Extremities: Edema: None  Fetal Status: Fetal Heart Rate (bpm): 160  Movement: Present   No results found for this or any previous visit (from the past 24 hour(s)).  Assessment & Plan:  High-risk pregnancy of a 41 y.o., [id number] at [redacted]w[redacted]d with an Estimated Date of Delivery: 12/18/22   1. Supervision of high risk pregnancy, antepartum -Anticipatory guidance for  upcoming appts. -Patient to schedule next appt in 3 weeks for an in-person visit. -Reviewed Glucola appt preparation including fasting the night before and morning of.   *Informed that it is okay to drink plain water throughout the night and prior to consumption of Glucola formula.  -Discussed anticipated office time of 2.5-3 hours.  -Reviewed blood draw procedures and labs which also include check of iron/HgB level, RPR, and HIV *Informed that repeat RPR/HIV are for pediatric records/compliance.   2. History of 2 cesarean deliveries, currently pregnant -Discussed need to speak with MD regarding TOLAC consent. -Patient states she does not like doctors, but informed this discussion is necessary to be approved for TOLAC. -Also discussed need to obtain previous surgery records for MD review. -Reassured that sometimes TOLAC is approved if records not available.  -Patient to sign ROI today for records  3. Obesity affecting pregnancy, antepartum -Taking bASA -TWG 28 lbs  4. [redacted] weeks gestation of pregnancy -Doing well overall.  5. Antepartum multigravida of advanced maternal age -Scheduled for growth [place] 10/6 -Informed that some [place] may  include BPP later in pregnancy. -Reviewed components of BPP.     Meds: No orders of the defined types were placed in this encounter.  Labs/procedures today:  Lab Orders  No laboratory test(s) ordered today     Reviewed: Preterm labor symptoms and general obstetric precautions including but not limited to vaginal bleeding, contractions, leaking of fluid and fetal movement were reviewed in detail with the patient.  All questions were answered.  Follow-up: Return in about 3 weeks (around 09/26/2022) for HROB with GTT.  No  orders of the defined types were placed in this encounter.  Maryann Conners MSN, CNM 09/05/2022

## 2022-09-20 ENCOUNTER — Ambulatory Visit: Payer: Medicaid Other | Admitting: *Deleted

## 2022-09-20 ENCOUNTER — Ambulatory Visit: Payer: Medicaid Other | Attending: Obstetrics and Gynecology

## 2022-09-20 VITALS — BP 106/53 | HR 70

## 2022-09-20 DIAGNOSIS — O09522 Supervision of elderly multigravida, second trimester: Secondary | ICD-10-CM | POA: Diagnosis present

## 2022-09-20 DIAGNOSIS — O99212 Obesity complicating pregnancy, second trimester: Secondary | ICD-10-CM

## 2022-09-20 DIAGNOSIS — O285 Abnormal chromosomal and genetic finding on antenatal screening of mother: Secondary | ICD-10-CM

## 2022-09-20 DIAGNOSIS — D563 Thalassemia minor: Secondary | ICD-10-CM | POA: Diagnosis present

## 2022-09-20 DIAGNOSIS — O0990 Supervision of high risk pregnancy, unspecified, unspecified trimester: Secondary | ICD-10-CM

## 2022-09-20 DIAGNOSIS — E039 Hypothyroidism, unspecified: Secondary | ICD-10-CM | POA: Insufficient documentation

## 2022-09-20 DIAGNOSIS — O99282 Endocrine, nutritional and metabolic diseases complicating pregnancy, second trimester: Secondary | ICD-10-CM | POA: Insufficient documentation

## 2022-09-20 DIAGNOSIS — Z3A27 27 weeks gestation of pregnancy: Secondary | ICD-10-CM

## 2022-09-20 DIAGNOSIS — O34219 Maternal care for unspecified type scar from previous cesarean delivery: Secondary | ICD-10-CM

## 2022-09-20 DIAGNOSIS — E669 Obesity, unspecified: Secondary | ICD-10-CM

## 2022-09-22 ENCOUNTER — Encounter (HOSPITAL_COMMUNITY): Payer: Self-pay | Admitting: Obstetrics and Gynecology

## 2022-09-22 ENCOUNTER — Inpatient Hospital Stay (HOSPITAL_COMMUNITY)
Admission: AD | Admit: 2022-09-22 | Discharge: 2022-09-22 | Disposition: A | Discharge status: Home / Self Care | Payer: Medicaid Other | Attending: Obstetrics and Gynecology | Admitting: Obstetrics and Gynecology

## 2022-09-22 DIAGNOSIS — Z3A27 27 weeks gestation of pregnancy: Secondary | ICD-10-CM | POA: Insufficient documentation

## 2022-09-22 DIAGNOSIS — O98812 Other maternal infectious and parasitic diseases complicating pregnancy, second trimester: Secondary | ICD-10-CM | POA: Insufficient documentation

## 2022-09-22 DIAGNOSIS — O23592 Infection of other part of genital tract in pregnancy, second trimester: Secondary | ICD-10-CM | POA: Insufficient documentation

## 2022-09-22 DIAGNOSIS — O99891 Other specified diseases and conditions complicating pregnancy: Secondary | ICD-10-CM | POA: Diagnosis not present

## 2022-09-22 DIAGNOSIS — O26892 Other specified pregnancy related conditions, second trimester: Secondary | ICD-10-CM | POA: Insufficient documentation

## 2022-09-22 DIAGNOSIS — B3731 Acute candidiasis of vulva and vagina: Secondary | ICD-10-CM | POA: Diagnosis not present

## 2022-09-22 DIAGNOSIS — M549 Dorsalgia, unspecified: Secondary | ICD-10-CM | POA: Insufficient documentation

## 2022-09-22 LAB — URINALYSIS, ROUTINE W REFLEX MICROSCOPIC
Bacteria, UA: NONE SEEN
Bilirubin Urine: NEGATIVE
Glucose, UA: NEGATIVE mg/dL
Hgb urine dipstick: NEGATIVE
Ketones, ur: 5 mg/dL — AB
Leukocytes,Ua: NEGATIVE
Nitrite: NEGATIVE
Protein, ur: 30 mg/dL — AB
Specific Gravity, Urine: 1.028 (ref 1.005–1.030)
pH: 5 (ref 5.0–8.0)

## 2022-09-22 LAB — WET PREP, GENITAL
Clue Cells Wet Prep HPF POC: NONE SEEN
Sperm: NONE SEEN
Trich, Wet Prep: NONE SEEN
WBC, Wet Prep HPF POC: >=10 — AB (ref <10)

## 2022-09-22 LAB — POCT FERN TEST: POCT Fern Test: NEGATIVE

## 2022-09-22 MED ORDER — CAPSAICIN 0.075 % EX CREA
TOPICAL_CREAM | Freq: Two times a day (BID) | CUTANEOUS | Status: DC
  Filled 2022-09-22: qty 60

## 2022-09-22 MED ORDER — TERCONAZOLE 0.4 % VA CREA: 1.0000 | TOPICAL_CREAM | Freq: Every day | VAGINAL | 0 refills | Status: AC

## 2022-09-22 MED ORDER — CAPSAICIN 0.025 % EX CREA
TOPICAL_CREAM | Freq: Two times a day (BID) | CUTANEOUS | Status: DC
  Filled 2022-09-22: qty 60

## 2022-09-22 MED ORDER — CYCLOBENZAPRINE HCL 5 MG PO TABS
10.0000 mg | ORAL_TABLET | Freq: Once | ORAL | Status: AC
  Administered 2022-09-22: 10 mg via ORAL
  Filled 2022-09-22: qty 2

## 2022-09-22 MED ORDER — CYCLOBENZAPRINE HCL 10 MG PO TABS: 10.0000 mg | ORAL_TABLET | Freq: Three times a day (TID) | ORAL | 0 refills | Status: DC | PRN

## 2022-09-22 NOTE — MAU Provider Note (Addendum)
History     CSN: [id number]  Arrival date and time: 09/22/22 1837   Event Date/Time   First Provider Initiated Contact with Patient 09/22/22 1935      Chief Complaint  Patient presents with   Back Pain   Heather Kelly a  41 y.o. [id number] at 77w4dpresents to MAU with complaints of Back pain since Friday, that has gotten increasingly worse. Patient describes as more "Discomfort and stiffness", noticing more with movement.  Denies attempting to take anything. Attempted a "all natural icy hot" and Epson salt bath without relief. Experiences brief relief Low back massage and counter pressure. Currently rates pain a 8/10. Denies other urinary symptoms.   She also noted Creamy white discharge, and all day today feeling as though she "is peeing on herself." Last intercourse Friday morning. Denies pain with intercourse, vaginal bleeding, itching or foul odor.  Fetal movement present.   Back Pain Pertinent negatives include no abdominal pain, chest pain, dysuria, fever, headaches, numbness, pelvic pain or weakness.    OB History     Gravida  4   Para  2   Term  2   Preterm  0   AB  1   Living  2      SAB  1   IAB  0   Ectopic  0   Multiple  0   Live Births  2           Past Medical History:  Diagnosis Date   Hypothyroidism    Medical history non-contributory     Past Surgical History:  Procedure Laterality Date   BARIATRIC SURGERY  04/10/2021   CESAREAN SECTION     CESAREAN SECTION      Family History  Problem Relation Age of Onset   Hypertension Mother    Cancer Father    Hypertension Maternal Grandmother    Cancer Maternal Grandfather     Social History   Tobacco Use   Smoking status: Former    Types: Cigarettes    Quit date: 03/03/2016    Years since quitting: 6.5   Smokeless tobacco: Never  Vaping Use   Vaping Use: Never used  Substance Use Topics   Alcohol use: Not Currently   Drug use: No    Allergies: No Known  Allergies  Medications Prior to Admission  Medication Sig Dispense Refill Last Dose   aspirin 81 MG chewable tablet Chew 1 tablet (81 mg total) by mouth daily. 30 tablet 6 Past Week   levothyroxine (SYNTHROID) 75 MCG tablet Take 75 mcg by mouth daily.   09/22/2022   polyethylene glycol powder (GLYCOLAX/MIRALAX) 17 GM/SCOOP powder Take 1 Container by mouth daily as needed.   Past Month   Prenat-FeCbn-FeAsp-Meth-FA-DHA (PRENATE MINI) 18-0.6-0.4-350 MG CAPS Take 1 capsule by mouth daily. 30 capsule 11 Past Week   VITAMIN D PO Take by mouth.   Past Week   Blood Pressure Monitoring (BLOOD PRESSURE KIT) DEVI 1 Device by Does not apply route once a week. 1 each 0    Calcium Carbonate (CALCIUM 600 PO) Take by mouth.      Cholecalciferol 25 MCG (1000 UT) tablet Take by mouth. (Patient not taking: Reported on 07/08/2022)      metroNIDAZOLE (FLAGYL) 500 MG tablet Take 1 tablet (500 mg total) by mouth 2 (two) times daily. (Patient not taking: Reported on 07/08/2022) 14 tablet 0    Misc. Devices (GOJJI WEIGHT SCALE) MISC 1 Device by Does not apply route once a  week. 1 each 0    Multiple Vitamins-Minerals (MULTIVITAMIN WITH MINERALS) tablet Take 1 tablet by mouth daily. Bariatric (Patient not taking: Reported on 07/08/2022)       Review of Systems  Constitutional:  Negative for chills, fatigue and fever.  Eyes:  Negative for pain and visual disturbance.  Respiratory:  Negative for apnea, shortness of breath and wheezing.   Cardiovascular:  Negative for chest pain and palpitations.  Gastrointestinal:  Negative for abdominal pain, constipation, diarrhea, nausea and vomiting.  Genitourinary:  Positive for vaginal discharge. Negative for difficulty urinating, dysuria, pelvic pain, vaginal bleeding and vaginal pain.  Musculoskeletal:  Positive for back pain.  Neurological:  Negative for dizziness, seizures, weakness, numbness and headaches.  Psychiatric/Behavioral:  Negative for suicidal ideas.    Physical Exam    Blood pressure (!) 122/58, pulse 84, temperature 98.2 F (36.8 C), temperature source Oral, resp. rate 17, last menstrual period 03/13/2022, SpO2 95 %, unknown if currently breastfeeding.  Physical Exam Vitals and nursing note reviewed. Exam conducted with a chaperone present.  Constitutional:      General: She is not in acute distress.    Appearance: Normal appearance.  HENT:     Head: Normocephalic.  Cardiovascular:     Rate and Rhythm: Normal rate and regular rhythm.  Pulmonary:     Effort: Pulmonary effort is normal.  Abdominal:     Palpations: Abdomen is soft.  Genitourinary:    General: Normal vulva.     Vagina: Vaginal discharge present.     Comments: Normal. Pregnancy discharge observed. Negative for pooling. Fern Negative   Dilation: Closed Exam by:: Gailen Shelter, CNM.  Musculoskeletal:        General: No swelling or tenderness. Normal range of motion.     Cervical back: Normal range of motion.     Right lower leg: No edema.     Left lower leg: No edema.  Skin:    General: Skin is warm and dry.     Capillary Refill: Capillary refill takes less than 2 seconds.  Neurological:     Mental Status: She is alert and oriented to person, place, and time.  Psychiatric:        Mood and Affect: Mood normal.     MAU Course  Procedures Orders Placed This Encounter  Procedures   Wet prep, genital   Urinalysis, Routine w reflex microscopic Urine, Clean Catch   Comprehensive metabolic panel   CBC   Fern Test   Meds ordered this encounter  Medications   cyclobenzaprine (FLEXERIL) tablet 10 mg   DISCONTD: capsicum (ZOSTRIX) 0.075 % cream   capsaicin (ZOSTRIX) 0.025 % cream   Results for orders placed or performed during the hospital encounter of 09/22/22 (from the past 24 hour(s))  Urinalysis, Routine w reflex microscopic Urine, Clean Catch     Status: Abnormal   Collection Time: 09/22/22  7:29 PM  Result Value Ref Range   Color, Urine AMBER (A) YELLOW   APPearance  HAZY (A) CLEAR   Specific Gravity, Urine 1.028 1.005 - 1.030   pH 5.0 5.0 - 8.0   Glucose, UA NEGATIVE NEGATIVE mg/dL   Hgb urine dipstick NEGATIVE NEGATIVE   Bilirubin Urine NEGATIVE NEGATIVE   Ketones, ur 5 (A) NEGATIVE mg/dL   Protein, ur 30 (A) NEGATIVE mg/dL   Nitrite NEGATIVE NEGATIVE   Leukocytes,Ua NEGATIVE NEGATIVE   RBC / HPF 0-5 0 - 5 RBC/hpf   WBC, UA 0-5 0 - 5 WBC/hpf   Bacteria,  UA NONE SEEN NONE SEEN   Squamous Epithelial / LPF 0-5 0 - 5   Mucus PRESENT   Fern Test     Status: Normal   Collection Time: 09/22/22  8:16 PM  Result Value Ref Range   POCT Fern Test Negative = intact amniotic membranes   Wet prep, genital     Status: Abnormal   Collection Time: 09/22/22  8:25 PM  Result Value Ref Range   Yeast Wet Prep HPF POC PRESENT (A) NONE SEEN   Trich, Wet Prep NONE SEEN NONE SEEN   Clue Cells Wet Prep HPF POC NONE SEEN NONE SEEN   WBC, Wet Prep HPF POC >=10 (A) <10   Sperm NONE SEEN      MDM Lab results reviewed and interpreted by me   - Yeast Infection noted on wet prep.  - Fern negative  - Low suspicion for ROM  - UA reflexed to culture to rule out UTI/ [place]    Transfer of care to E. Purcell Nails, NP at 2119.   Shantonette Isaias Sakai) Rollene Rotunda, MSN, [place] [facility]  09/22/22 9:18 PM    Some improvement in symptoms after flexeril. Will discharge with prescription for flexeril prn. Also discussed use of maternity support belts as her bilateral low back pain is likely musculoskeletal in nature.   U/a negative for signs of infection but urine culture pending.    NST:  Baseline: 145 bpm, Variability: Good {> 6 bpm), Accelerations: Reactive, and Decelerations: Absent  Assessment and Plan   1. Back pain affecting pregnancy in second trimester  -Rx flexeril prn -Encouraged to start using maternity support belt  2. Vaginal yeast infection  -Rx terazol  3. [redacted] weeks gestation of pregnancy     Jorje Guild, NP

## 2022-09-24 LAB — CULTURE, OB URINE: Culture: <10000 — AB

## 2022-09-25 ENCOUNTER — Other Ambulatory Visit: Payer: Self-pay | Admitting: Emergency Medicine

## 2022-09-25 DIAGNOSIS — M549 Dorsalgia, unspecified: Secondary | ICD-10-CM

## 2022-09-25 NOTE — Progress Notes (Signed)
TC from patient with c/o low back pain. Reports some relief with flexeril, but refuses Tylenol. Discussed that lidocaine patches are not recommended.   Discussed espom salt baths, alternating heat and ice, gentle exercises and stretches.   Patient desires to see chiropractor today, but it is not recommended at this [place].   Discussed referral for Physical Therapy, and patient agreed.   Given return precautions to MAU, and reviewed signs of PTL.

## 2022-09-27 ENCOUNTER — Encounter (HOSPITAL_COMMUNITY): Payer: Self-pay | Admitting: Obstetrics & Gynecology

## 2022-09-27 ENCOUNTER — Ambulatory Visit: Payer: Medicaid Other | Admitting: Physical Therapy

## 2022-09-27 ENCOUNTER — Inpatient Hospital Stay (HOSPITAL_COMMUNITY)
Admission: AD | Admit: 2022-09-27 | Discharge: 2022-09-27 | Disposition: A | Discharge status: Home / Self Care | Payer: Medicaid Other | Attending: Obstetrics & Gynecology | Admitting: Obstetrics & Gynecology

## 2022-09-27 ENCOUNTER — Inpatient Hospital Stay (HOSPITAL_COMMUNITY): Payer: Medicaid Other

## 2022-09-27 DIAGNOSIS — M5126 Other intervertebral disc displacement, lumbar region: Secondary | ICD-10-CM

## 2022-09-27 DIAGNOSIS — Z3A28 28 weeks gestation of pregnancy: Secondary | ICD-10-CM | POA: Diagnosis not present

## 2022-09-27 DIAGNOSIS — O26893 Other specified pregnancy related conditions, third trimester: Secondary | ICD-10-CM | POA: Insufficient documentation

## 2022-09-27 DIAGNOSIS — O99891 Other specified diseases and conditions complicating pregnancy: Secondary | ICD-10-CM | POA: Insufficient documentation

## 2022-09-27 DIAGNOSIS — O26613 Liver and biliary tract disorders in pregnancy, third trimester: Secondary | ICD-10-CM | POA: Diagnosis not present

## 2022-09-27 DIAGNOSIS — O0990 Supervision of high risk pregnancy, unspecified, unspecified trimester: Secondary | ICD-10-CM

## 2022-09-27 DIAGNOSIS — Z3689 Encounter for other specified antenatal screening: Secondary | ICD-10-CM | POA: Diagnosis not present

## 2022-09-27 DIAGNOSIS — O09523 Supervision of elderly multigravida, third trimester: Secondary | ICD-10-CM | POA: Insufficient documentation

## 2022-09-27 DIAGNOSIS — O99283 Endocrine, nutritional and metabolic diseases complicating pregnancy, third trimester: Secondary | ICD-10-CM | POA: Insufficient documentation

## 2022-09-27 DIAGNOSIS — M549 Dorsalgia, unspecified: Secondary | ICD-10-CM | POA: Diagnosis present

## 2022-09-27 DIAGNOSIS — D563 Thalassemia minor: Secondary | ICD-10-CM

## 2022-09-27 LAB — URINALYSIS, ROUTINE W REFLEX MICROSCOPIC
Bilirubin Urine: NEGATIVE
Glucose, UA: NEGATIVE mg/dL
Hgb urine dipstick: NEGATIVE
Ketones, ur: NEGATIVE mg/dL
Leukocytes,Ua: NEGATIVE
Nitrite: NEGATIVE
Protein, ur: NEGATIVE mg/dL
Specific Gravity, Urine: 1.018 (ref 1.005–1.030)
pH: 6 (ref 5.0–8.0)

## 2022-09-27 MED ORDER — METHYLPREDNISOLONE 4 MG PO TBPK: ORAL_TABLET | ORAL | 0 refills | Status: DC

## 2022-09-27 MED ORDER — OXYCODONE HCL 5 MG PO TABS: 5.0000 mg | ORAL_TABLET | Freq: Four times a day (QID) | ORAL | 0 refills | Status: AC | PRN

## 2022-09-27 NOTE — MAU Note (Signed)
Patient up to restroom with assistance from RN.   Patient ambulated from the restroom sink to the wheelchair without assistance.

## 2022-09-27 NOTE — MAU Note (Signed)
Spoke with Heather Kelly in [place]. Patient placed in the queue for MRI.

## 2022-09-27 NOTE — MAU Provider Note (Signed)
History     CSN: [id number]  Arrival date and time: 09/27/22 1322   Event Date/Time   First Provider Initiated Contact with Patient 09/27/22 1536      Chief Complaint  Patient presents with   Back Pain   Heather Kelly is a 41 y.o. [id number] at 70w2dwho receives care at [name].  She presents today for Back Pain. She states this pain has been ongoing for the past week and was seen in the MAU.  She reports she has tried Epson salt, Muscle Relaxant, TENS pad, Stretching, Capsaicin, and Ice/Heat application. She states the pain was relieved, slightly, with the TENS pad and ice application.  Patient states the pain is worse with movement and that she has difficulty bearing her weight.  She states she has no pain with laying down. Patient rates the pain a 10/10. Patient also reports that she has experienced some "dragging" of her right foot today. Patient endorses a history of back pain after a fall in 2020.  She states she has been seeing a chiropractor since 2022 and has had 4-5 sessions for her back pain.  She states she was scheduled for PT today, but did not make the appt.  She reports good fetal movement and denies abdominal cramping or contractions.  No vaginal concerns.     OB History     Gravida  4   Para  2   Term  2   Preterm  0   AB  1   Living  2      SAB  1   IAB  0   Ectopic  0   Multiple  0   Live Births  2           Past Medical History:  Diagnosis Date   Hypothyroidism     Past Surgical History:  Procedure Laterality Date   BARIATRIC SURGERY  04/10/2021   CESAREAN SECTION  2011   CESAREAN SECTION  2007    Family History  Problem Relation Age of Onset   Hypertension Mother    Cancer Father    Hypertension Maternal Grandmother    Cancer Maternal Grandfather     Social History   Tobacco Use   Smoking status: Former    Types: Cigarettes    Quit date: 03/03/2016    Years since quitting: 6.5   Smokeless tobacco: Never  Vaping Use    Vaping Use: Never used  Substance Use Topics   Alcohol use: Not Currently   Drug use: No    Allergies: No Known Allergies  Medications Prior to Admission  Medication Sig Dispense Refill Last Dose   aspirin 81 MG chewable tablet Chew 1 tablet (81 mg total) by mouth daily. 30 tablet 6 Past Week   Calcium Carbonate (CALCIUM 600 PO) Take by mouth.   Past Month   cyclobenzaprine (FLEXERIL) 10 MG tablet Take 1 tablet (10 mg total) by mouth 3 (three) times daily as needed for muscle spasms. 30 tablet 0 09/27/2022 at 1000   levothyroxine (SYNTHROID) 75 MCG tablet Take 75 mcg by mouth daily.   09/27/2022   VITAMIN D PO Take by mouth.   Past Week   Blood Pressure Monitoring (BLOOD PRESSURE KIT) DEVI 1 Device by Does not apply route once a week. 1 each 0    Misc. Devices (GOJJI WEIGHT SCALE) MISC 1 Device by Does not apply route once a week. 1 each 0    polyethylene glycol powder (GLYCOLAX/MIRALAX) 17 GM/SCOOP  powder Take 1 Container by mouth daily as needed.      Prenat-FeCbn-FeAsp-Meth-FA-DHA (PRENATE MINI) 18-0.6-0.4-350 MG CAPS Take 1 capsule by mouth daily. 30 capsule 11    terconazole (TERAZOL 7) 0.4 % vaginal cream Place 1 applicator vaginally at bedtime for 7 days. 45 g 0     Review of Systems  Constitutional:  Negative for chills and fever.  Genitourinary:  Negative for difficulty urinating and dysuria.  Musculoskeletal:  Positive for back pain and gait problem.   Physical Exam   Blood pressure (!) 117/56, pulse 76, temperature 98.3 F (36.8 C), temperature source Oral, resp. rate 15, last menstrual period 03/13/2022, SpO2 100 %, unknown if currently breastfeeding.  Physical Exam Constitutional:      Appearance: Normal appearance.  HENT:     Head: Normocephalic and atraumatic.  Eyes:     Conjunctiva/sclera: Conjunctivae normal.  Cardiovascular:     Rate and Rhythm: Normal rate.  Pulmonary:     Effort: Pulmonary effort is normal. No respiratory distress.  Abdominal:      General: Bowel sounds are normal.  Musculoskeletal:     Thoracic back: No tenderness.     Lumbar back: Tenderness present. Decreased range of motion. Positive right straight leg raise test and positive left straight leg raise test.     Right lower leg: No tenderness. No edema.     Left lower leg: No tenderness. No edema.     Right ankle: No swelling. No tenderness. Normal range of motion.     Left ankle: No swelling. No tenderness. Normal range of motion.  Skin:    General: Skin is warm and dry.  Neurological:     Mental Status: She is alert and oriented to person, place, and time.     Sensory: Sensation is intact.     Motor: No weakness.     Comments: Gait not assessed d/t patient c/o pain.   Psychiatric:        Mood and Affect: Mood normal.        Behavior: Behavior normal.     Fetal Assessment 144 bpm, Mod Var, -Decels, +Accels Toco: No ctx graphed  MAU Course   Results for orders placed or performed during the hospital encounter of 09/27/22 (from the past 24 hour(s))  Urinalysis, Routine w reflex microscopic Urine, Clean Catch     Status: Abnormal   Collection Time: 09/27/22  1:50 PM  Result Value Ref Range   Color, Urine YELLOW YELLOW   APPearance HAZY (A) CLEAR   Specific Gravity, Urine 1.018 1.005 - 1.030   pH 6.0 5.0 - 8.0   Glucose, UA NEGATIVE NEGATIVE mg/dL   Hgb urine dipstick NEGATIVE NEGATIVE   Bilirubin Urine NEGATIVE NEGATIVE   Ketones, ur NEGATIVE NEGATIVE mg/dL   Protein, ur NEGATIVE NEGATIVE mg/dL   Nitrite NEGATIVE NEGATIVE   Leukocytes,Ua NEGATIVE NEGATIVE   MR THORACIC SPINE WO CONTRAST  Result Date: 09/27/2022 CLINICAL DATA:  Pregnant, low back pain EXAM: MRI THORACIC AND LUMBAR SPINE WITHOUT CONTRAST TECHNIQUE: Multiplanar and multiecho pulse sequences of the thoracic and lumbar spine were obtained without intravenous contrast. COMPARISON:  No prior MRI of the thoracic or lumbar spine, correlation is made with 07/26/2016 CT thoracic and lumbar  spine. FINDINGS: MRI THORACIC SPINE FINDINGS Alignment: Straightening of the normal thoracic kyphosis. Mild S shaped curvature of the thoracolumbar spine. Vertebrae: No fracture, evidence of discitis, or bone lesion. T1 and T2 hyperintense focus in the T12 vertebral body is compatible with a benign  hemangioma. Cord:  Normal signal and morphology. Paraspinal and other soft tissues: Evaluation is limited by motion artifact. Subcentimeter hepatic cysts in the right hepatic lobe. Mild bilateral hydronephrosis. Disc levels: No spinal canal stenosis or neural foraminal narrowing. MRI LUMBAR SPINE FINDINGS Segmentation: 5 lumbar-type vertebral bodies. Hypoplastic ribs at T12. Alignment:  Mild levocurvature.  No listhesis. Vertebrae:  No acute fracture or suspicious osseous lesion. Conus medullaris and cauda equina: Conus extends to the L1-L2 level. Conus and cauda equina appear normal. Paraspinal and other soft tissues: Gravid uterus. Otherwise negative. Disc levels: L4-L5. No significant disc bulge. Mild facet arthropathy. No spinal canal stenosis or neural foraminal narrowing. L5-S1: Large right paracentral disc protrusion, which projects up to 10 mm posterior to the disc space. This contacts both descending S1 nerve roots (series 20, image 34) and causes mild spinal canal stenosis. Moderate right and mild left facet arthropathy. No neural foraminal narrowing. IMPRESSION: 1. L5-S1 large right paracentral disc protrusion, which contacts both descending S1 nerve roots and causes mild spinal canal stenosis. 2. Mild bilateral hydronephrosis. 3. No acute fracture or traumatic listhesis in the thoracic or lumbar spine. Electronically Signed   By: Merilyn Baba M.D.   On: 09/27/2022 19:12   MR LUMBAR SPINE WO CONTRAST  Result Date: 09/27/2022 CLINICAL DATA:  Pregnant, low back pain EXAM: MRI THORACIC AND LUMBAR SPINE WITHOUT CONTRAST TECHNIQUE: Multiplanar and multiecho pulse sequences of the thoracic and lumbar spine were  obtained without intravenous contrast. COMPARISON:  No prior MRI of the thoracic or lumbar spine, correlation is made with 07/26/2016 CT thoracic and lumbar spine. FINDINGS: MRI THORACIC SPINE FINDINGS Alignment: Straightening of the normal thoracic kyphosis. Mild S shaped curvature of the thoracolumbar spine. Vertebrae: No fracture, evidence of discitis, or bone lesion. T1 and T2 hyperintense focus in the T12 vertebral body is compatible with a benign hemangioma. Cord:  Normal signal and morphology. Paraspinal and other soft tissues: Evaluation is limited by motion artifact. Subcentimeter hepatic cysts in the right hepatic lobe. Mild bilateral hydronephrosis. Disc levels: No spinal canal stenosis or neural foraminal narrowing. MRI LUMBAR SPINE FINDINGS Segmentation: 5 lumbar-type vertebral bodies. Hypoplastic ribs at T12. Alignment:  Mild levocurvature.  No listhesis. Vertebrae:  No acute fracture or suspicious osseous lesion. Conus medullaris and cauda equina: Conus extends to the L1-L2 level. Conus and cauda equina appear normal. Paraspinal and other soft tissues: Gravid uterus. Otherwise negative. Disc levels: L4-L5. No significant disc bulge. Mild facet arthropathy. No spinal canal stenosis or neural foraminal narrowing. L5-S1: Large right paracentral disc protrusion, which projects up to 10 mm posterior to the disc space. This contacts both descending S1 nerve roots (series 20, image 34) and causes mild spinal canal stenosis. Moderate right and mild left facet arthropathy. No neural foraminal narrowing. IMPRESSION: 1. L5-S1 large right paracentral disc protrusion, which contacts both descending S1 nerve roots and causes mild spinal canal stenosis. 2. Mild bilateral hydronephrosis. 3. No acute fracture or traumatic listhesis in the thoracic or lumbar spine. Electronically Signed   By: Merilyn Baba M.D.   On: 09/27/2022 19:12    MDM PE Labs: UA EFM MRI Consult Prescription Assessment and Plan  41 year  [id number]  SIUP at 28.2 weeks Cat I FT Back Pain  -Exam performed and findings discussed. -Discussed recommendation of MRI for visualization of bone and soft tissue of spine. -Reviewed potential risks associated with MRI particularly with contrast use and exposure to fetus. -However, would consult with radiologist and perform testing based on their  recommendation.  -Patient offered and declines pain medication.  -Radiology consulted and informed of patient status, evaluation and recommendation for appropriate MRI requested. Advised: *MRI of lumbar spine without contrast *MRI of thoracic spine without contrast per provider discretion.  -Provider to bedside to discuss recommendation. -Patient requests to speak with husband and will notify nurse if she is desiring. -Informed that order will be placed and if declines will discontinue and discharge.  -NST reactive  Maryann Conners MSN, CNM 09/27/2022, 3:36 PM   Reassessment (5:29 PM) -Per nurse, patient to bathroom with assistance. States that patient ambulating without apparent foot drop.   Reassessment (7:04 PM) -Patient returns from MRI -Awaiting results.   Reassessment (7:30 PM) -Results as above and reviewed. -Dr. Fransico Meadow consulted and advised contact Neurology and/or Neurosurgery regarding follow up.  -Neurology consulted and informed of patient status, evaluation, interventions, and results. Advised: *Call neurosurgery  *Instruct patient to stop chiropractor visitations.  -Neurosurgery consulted and informed of patient status, evaluation, interventions, and results. Advised: *Patient to follow up after delivery. *Okay to give 6 day medrol or prednisone taper.  *Patient can utilize tylenol prn. -Provider to bedside to discuss results and recommendations. -Informed of POC as advised by neurology. -Discussed potential risks and anticipated worsening of symptoms as pregnancy progresses. Reviewed activity limitations as  appropriate.  -Patient expresses concern with taking tylenol and states she is not comfortable. -Discussed limited usage of oxycodone and patient agreeable. Script for 3 days sent to pharmacy on file.  -Precautions reviewed. -Encouraged to call primary office or return to [facility] if symptoms worsen or with the onset of new symptoms. -Discharged to home in stable condition.  Maryann Conners MSN, CNM Advanced Practice Provider, [facility] for [facility]

## 2022-09-27 NOTE — Therapy (Deleted)
OUTPATIENT PHYSICAL THERAPY FEMALE PELVIC EVALUATION   Patient Name: Heather Kelly MRN: [id number] DOB:1981/07/06, 41 y.o., female Today's Date: 09/27/2022    Past Medical History:  Diagnosis Date   Hypothyroidism    Past Surgical History:  Procedure Laterality Date   BARIATRIC SURGERY  04/10/2021   CESAREAN SECTION  2011   CESAREAN SECTION  2007   Patient Active Problem List   Diagnosis Date Noted   Obesity affecting pregnancy, antepartum 08/08/2022   Alpha thalassemia silent carrier 06/20/2022   AMA (advanced maternal age) multigravida 35+ 06/05/2022   History of 2 cesarean deliveries, currently pregnant 06/05/2022   Hypothyroidism affecting pregnancy 06/05/2022   Supervision of high risk pregnancy, antepartum 05/17/2022   OSA on CPAP 01/23/2021    PCP: Mayra Neer, MD  REFERRING PROVIDER: Shelly Bombard, MD  REFERRING DIAG: O99.891,M54.9 (ICD-10-CM) - Back pain affecting pregnancy in second trimester  THERAPY DIAG:  No diagnosis found.  Rationale for Evaluation and Treatment Rehabilitation  ONSET DATE: ***  SUBJECTIVE:                                                                                                                                                                                           SUBJECTIVE STATEMENT: *** Fluid intake: {Yes/No:304960894}    PAIN:  Are you having pain? {yes/no:20286} NPRS scale: ***/10 Pain location: {pelvic pain location:27098}  Pain type: {type:313116} Pain description: {PAIN DESCRIPTION:21022940}   Aggravating factors: *** Relieving factors: ***  PRECAUTIONS: {Therapy precautions:24002}  WEIGHT BEARING RESTRICTIONS {Yes ***/No:24003}  FALLS:  Has patient fallen in last 6 months? {fallsyesno:27318}  LIVING ENVIRONMENT: Lives with: {OPRC lives with:25569::"lives with their family"} Lives in: {Lives in:25570} Stairs: {opstairs:27293} Has following equipment at home: {Assistive  devices:23999}  OCCUPATION: ***  PLOF: {PLOF:24004}  PATIENT GOALS ***  PERTINENT HISTORY:  Bariatric surgery; C-section x2; Hypothyroidism Sexual abuse: {Yes/No:304960894}  BOWEL MOVEMENT Pain with bowel movement: {yes/no:20286} Type of bowel movement:{PT BM type:27100} Fully empty rectum: {Yes/No:304960894} Leakage: {Yes/No:304960894} Pads: {Yes/No:304960894} Fiber supplement: {Yes/No:304960894}  URINATION Pain with urination: {yes/no:20286} Fully empty bladder: {Yes/No:304960894} Stream: {PT urination:27102} Urgency: {Yes/No:304960894} Frequency: *** Leakage: {PT leakage:27103} Pads: {Yes/No:304960894}  INTERCOURSE Pain with intercourse: {pain with intercourse PA:27099} Ability to have vaginal penetration:  {Yes/No:304960894} Climax: *** Marinoff Scale: ***/3  PREGNANCY Vaginal deliveries *** Tearing {Yes***/No:304960894} C-section deliveries *** Currently pregnant {Yes***/No:304960894}  PROLAPSE {PT prolapse:27101}    OBJECTIVE:   DIAGNOSTIC FINDINGS:  ***  PATIENT SURVEYS:  {rehab surveys:24030}  PFIQ-7 ***  COGNITION:  Overall cognitive status: {cognition:24006}     SENSATION:  Light touch: {intact/deficits:24005}  Proprioception: {intact/deficits:24005}  MUSCLE LENGTH: Hamstrings: Right *** deg; Left ***  deg Marcello Moores test: Right *** deg; Left *** deg  LUMBAR SPECIAL TESTS:  {lumbar special test:25242}  FUNCTIONAL TESTS:  {Functional tests:24029}  GAIT: Distance walked: *** Assistive device utilized: {Assistive devices:23999} Level of assistance: {Levels of assistance:24026} Comments: ***               POSTURE: {posture:25561}   PELVIC ALIGNMENT:  LUMBARAROM/PROM  A/PROM A/PROM  eval  Flexion   Extension   Right lateral flexion   Left lateral flexion   Right rotation   Left rotation    (Blank rows = not tested)  LOWER EXTREMITY ROM:  {AROM/PROM:27142} ROM Right eval Left eval  Hip flexion    Hip extension    Hip  abduction    Hip adduction    Hip internal rotation    Hip external rotation    Knee flexion    Knee extension    Ankle dorsiflexion    Ankle plantarflexion    Ankle inversion    Ankle eversion     (Blank rows = not tested)  LOWER EXTREMITY MMT:  MMT Right eval Left eval  Hip flexion    Hip extension    Hip abduction    Hip adduction    Hip internal rotation    Hip external rotation    Knee flexion    Knee extension    Ankle dorsiflexion    Ankle plantarflexion    Ankle inversion    Ankle eversion      PALPATION:   General  ***                External Perineal Exam ***                             Internal Pelvic Floor ***  Patient confirms identification and approves PT to assess internal pelvic floor and treatment {yes/no:20286}  PELVIC MMT:   MMT eval  Vaginal   Internal Anal Sphincter   External Anal Sphincter   Puborectalis   Diastasis Recti   (Blank rows = not tested)        TONE: ***  PROLAPSE: ***  TODAY'S TREATMENT  EVAL ***   PATIENT EDUCATION:  Education details: *** Person educated: {Person educated:25204} Education method: {Education Method:25205} Education comprehension: {Education Comprehension:25206}   HOME EXERCISE PROGRAM: ***  ASSESSMENT:  CLINICAL IMPRESSION: Patient is a *** y.o. *** who was seen today for physical therapy evaluation and treatment for ***.    OBJECTIVE IMPAIRMENTS {opptimpairments:25111}.   ACTIVITY LIMITATIONS {activitylimitations:27494}  PARTICIPATION LIMITATIONS: {participationrestrictions:25113}  PERSONAL FACTORS {Personal factors:25162} are also affecting patient's functional outcome.   REHAB POTENTIAL: {rehabpotential:25112}  CLINICAL DECISION MAKING: {clinical decision making:25114}  EVALUATION COMPLEXITY: {Evaluation complexity:25115}   GOALS: Goals reviewed with patient? {yes/no:20286}  SHORT TERM GOALS: Target date: {follow up:25551}  *** Baseline: Goal status:  {GOALSTATUS:25110}  2.  *** Baseline:  Goal status: {GOALSTATUS:25110}  3.  *** Baseline:  Goal status: {GOALSTATUS:25110}  4.  *** Baseline:  Goal status: {GOALSTATUS:25110}  5.  *** Baseline:  Goal status: {GOALSTATUS:25110}  6.  *** Baseline:  Goal status: {GOALSTATUS:25110}  LONG TERM GOALS: Target date: {follow up:25551}   *** Baseline:  Goal status: {GOALSTATUS:25110}  2.  *** Baseline:  Goal status: {GOALSTATUS:25110}  3.  *** Baseline:  Goal status: {GOALSTATUS:25110}  4.  *** Baseline:  Goal status: {GOALSTATUS:25110}  5.  *** Baseline:  Goal status: {GOALSTATUS:25110}  6.  *** Baseline:  Goal status: {GOALSTATUS:25110}  PLAN: PT FREQUENCY: {rehab frequency:25116}  PT DURATION: {rehab duration:25117}  PLANNED INTERVENTIONS: {rehab planned interventions:25118::"Therapeutic exercises","Therapeutic activity","Neuromuscular re-education","Balance training","Gait training","Patient/Family education","Self Care","Joint mobilization"}  PLAN FOR NEXT SESSION: ***   Kismet Facemire, PT 09/27/2022, 10:07 AM

## 2022-09-27 NOTE — MAU Note (Addendum)
...  Heather Kelly is a 42 y.o. at [redacted]w[redacted]d here in MAU reporting: Lower back pain with movement x1 week. She reports no pain at rest. She reports it is mostly on her right lower back. She reports she was evaluated in MAU x1 week ago and was prescribed flexeril. She reports the flexeril does not help her. She reports she was feeling better yesterday and took a walk with her husband. She reports after the walk her pain started to become worse. Denies VB or LOF. +FM.  She reports she is unable to walk. She reports she last walked around 0900 this morning and had to "drag" her right leg and reports she was only able to lift it around an inch off of the floor.  Last took Flexeril this morning at 1000.  Onset of complaint: x1 week Pain score: 10/10 lower back  FHT: 165 initial external

## 2022-10-01 ENCOUNTER — Other Ambulatory Visit: Payer: Self-pay

## 2022-10-01 ENCOUNTER — Encounter: Payer: Self-pay | Admitting: Physical Therapy

## 2022-10-01 ENCOUNTER — Encounter: Payer: Medicaid Other | Attending: Obstetrics | Admitting: Physical Therapy

## 2022-10-01 DIAGNOSIS — M5459 Other low back pain: Secondary | ICD-10-CM | POA: Insufficient documentation

## 2022-10-01 DIAGNOSIS — O99891 Other specified diseases and conditions complicating pregnancy: Secondary | ICD-10-CM | POA: Insufficient documentation

## 2022-10-01 DIAGNOSIS — M549 Dorsalgia, unspecified: Secondary | ICD-10-CM | POA: Diagnosis not present

## 2022-10-01 NOTE — Therapy (Signed)
OUTPATIENT PHYSICAL THERAPY FEMALE PELVIC EVALUATION   Patient Name: Heather Kelly MRN: [id number] DOB:1981-01-31, 41 y.o., female Today's Date: 10/01/2022   PT End of Session - 10/01/22 1501     Visit Number 1    Date for PT Re-Evaluation 12/10/22    Authorization Type [employer]    PT Start Time 1400    PT Stop Time 2122    PT Time Calculation (min) 45 min    Activity Tolerance Patient tolerated treatment well    Behavior During Therapy [facility] for tasks assessed/performed             Past Medical History:  Diagnosis Date   Hypothyroidism    Past Surgical History:  Procedure Laterality Date   BARIATRIC SURGERY  04/10/2021   CESAREAN SECTION  2011   CESAREAN SECTION  2007   Patient Active Problem List   Diagnosis Date Noted   Obesity affecting pregnancy, antepartum 08/08/2022   Alpha thalassemia silent carrier 06/20/2022   AMA (advanced maternal age) multigravida 35+ 06/05/2022   History of 2 cesarean deliveries, currently pregnant 06/05/2022   Hypothyroidism affecting pregnancy 06/05/2022   Supervision of high risk pregnancy, antepartum 05/17/2022   OSA on CPAP 01/23/2021    PCP: Mayra Neer, MD  REFERRING PROVIDER: Shelly Bombard, MD  REFERRING DIAG: O99.891,M54.9 (ICD-10-CM) - Back pain affecting pregnancy in second trimester  THERAPY DIAG:  Other low back pain  Rationale for Evaluation and Treatment Rehabilitation  ONSET DATE: 09/20/2022  SUBJECTIVE:                                                                                                                                                                                           SUBJECTIVE STATEMENT: Back pain came on suddenly.    PAIN:  Are you having pain? Yes NPRS scale: 6/10 Pain location:  low back, right hip to the tailbone  Pain type: soreness Pain description: intermittent   Aggravating factors: sitting, standing, positions that support her weight Relieving factors: laying  down  PRECAUTIONS: Other: pregnant  WEIGHT BEARING RESTRICTIONS No  FALLS:  Has patient fallen in last 6 months? No  LIVING ENVIRONMENT: Lives with: lives with their family  OCCUPATION: none  PLOF: Independent  PATIENT GOALS not be in pain  PERTINENT HISTORY:  Pregnant; Hypothyroidism; c-section x2; bariatric surgery 04/10/2021   PREGNANCY Vaginal deliveries 0 C-section deliveries 2 Currently pregnant Yes: due 12/18/22 ; children age is 64 and 15 year old     OBJECTIVE:   DIAGNOSTIC FINDINGS:  Herniated disc between L5-S1  PATIENT SURVEYS:  Modified Oswestry 33 points severe disability    COGNITION:  Overall cognitive status: Within functional limits for tasks assessed     SENSATION:  Light touch: Appears intact  Proprioception: Appears intact    LUMBAR SPECIAL TESTS:  Straight leg raise test: Positive and Gaenslen's test: Positive on right for both, right pain goes to right trochanter                POSTURE: rounded shoulders, forward head, decreased lumbar lordosis, weight shift left, and hips shifted left   PELVIC ALIGNMENT: right ilium is anteriorly rotated  LUMBARAROM/PROM  A/PROM A/PROM  eval  Flexion Decreased by 75%  Extension Decreased by 75%  Right lateral flexion Decreased by 75%  Left lateral flexion Decreased by 75%  Right rotation Decreased by 75%  Left rotation Decreased by 75%   (Blank rows = not tested)      PALPATION:   General  tenderness located on the right quadratus, lumbar paraspinals, gluteals and right levator ani                  TODAY'S TREATMENT  EVAL Date: 10/01/2022 HEP established-see below     PATIENT EDUCATION:  10/01/2022 Education details: [id number]; how to move in bed keeping spinal neutral and go from sit to stand; gave her information on managing back pain Person educated: Patient Education method: Explanation, Demonstration, Tactile cues, Verbal cues, and Handouts Education comprehension:  verbalized understanding, returned demonstration, verbal cues required, tactile cues required, and needs further education   HOME EXERCISE PROGRAM: 10/01/2022 Access Code: [email or web address] URL: [email or web address] Date: 10/01/2022 Prepared by: Eulis Foster  Exercises - Lateral Shift Correction at Wall  - 1 x daily - 7 x weekly - 1 sets - 5 reps  Patient Education - Low Back Pain Handout - Managing Back Pain  ASSESSMENT:  CLINICAL IMPRESSION: Patient is a 41 y.o. female who was seen today for physical therapy evaluation and treatment for low back pain . Patient is pregnant and due on 12/18/2022. Patient reports her back pain goes to the right buttocks and is 6/10 intermittently.  Her pain is worse with sitting, standing or position that supports her spine. Her lumbar ROM is limited by 75% due to pain. She stands with reduced lumbar lordosis and hips shifted to the left. Her right ilium is rotated anteriorly . She has positive Gaenslen's on the right. She has tenderness located in the right quadratus, lumbar paraspinals, and gluteals. Patient had a negative Gaenslen's on the right after muscle energy to correct the ilium. Her MRI shows a herniated disc at L5-S1. She has difficulty with all tasks at home due to pain. Her modified Oswestry score was 33 for moderate disability. Patient will benefit from skilled therapy to reduce her back pain and improve her mobility during her pregnancy to improve her function.    OBJECTIVE IMPAIRMENTS decreased activity tolerance, decreased coordination, decreased mobility, decreased ROM, increased muscle spasms, and pain.   ACTIVITY LIMITATIONS carrying, lifting, bending, sitting, standing, squatting, sleeping, stairs, transfers, bed mobility, bathing, and dressing  PARTICIPATION LIMITATIONS: meal prep, cleaning, laundry, driving, shopping, and community activity  PERSONAL FACTORS Age, Time since onset of injury/illness/exacerbation, and 1-2  comorbidities: Pregnant; Hypothyroidism; c-section x2; bariatric surgery 04/10/2021  are also affecting patient's functional outcome.   REHAB POTENTIAL: Excellent  CLINICAL DECISION MAKING: Evolving/moderate complexity  EVALUATION COMPLEXITY: Moderate   GOALS: Goals reviewed with patient? Yes  SHORT TERM GOALS: Target date: 10/29/2022  Patient independent with lumbar exercise program to move her disc off the nerve and centralize her back pain.  Baseline:Patient pain is in the right buttocks Goal status: INITIAL   LONG TERM GOALS: Target date: 12/10/2022   Patient independent with advanced HEP for back and SI stabilization exercises.  Baseline: Not educated yet Goal status: INITIAL  2.  Patient reports her back pain decreased to 2/10 or less due to centralizing her back pain.  Baseline: pain level 6/10 and goies into the right buttocks Goal status: INITIAL  3.  Patient educated on correct body mechanics to reduce the strain on her back during her pregnancy and reduce her pain.  Baseline: not educated yet.  Goal status: INITIAL  4.  Modified Oswestry score decreased </= 4 so she is able to perform her daily activities with greater ease.  Baseline: score is 33 Goal status: INITIAL  5.  Patient educated on ways to give birth to decrease strain on her back.  Baseline: Not educated yet Goal status: INITIAL   PLAN: PT FREQUENCY: 1x/week  PT DURATION: 10 weeks  PLANNED INTERVENTIONS: Therapeutic exercises, Therapeutic activity, Neuromuscular re-education, Patient/Family education, Self Care, Joint mobilization, Spinal mobilization, Cryotherapy, Moist heat, and Manual therapy  PLAN FOR NEXT SESSION: see if pelvis is balanced, manual work to lumbar spine and muscles, if pain centralized then do some back extension, quadruped back exercises, sitting shift legs back and forth   Eulis Foster, PT 10/01/22 3:24 PM

## 2022-10-03 ENCOUNTER — Other Ambulatory Visit: Payer: Medicaid Other

## 2022-10-03 ENCOUNTER — Ambulatory Visit (INDEPENDENT_AMBULATORY_CARE_PROVIDER_SITE_OTHER): Payer: Medicaid Other | Admitting: Obstetrics and Gynecology

## 2022-10-03 VITALS — BP 111/70 | HR 67 | Wt 240.2 lb

## 2022-10-03 DIAGNOSIS — O34219 Maternal care for unspecified type scar from previous cesarean delivery: Secondary | ICD-10-CM

## 2022-10-03 DIAGNOSIS — O0993 Supervision of high risk pregnancy, unspecified, third trimester: Secondary | ICD-10-CM

## 2022-10-03 DIAGNOSIS — E039 Hypothyroidism, unspecified: Secondary | ICD-10-CM

## 2022-10-03 DIAGNOSIS — Z3A29 29 weeks gestation of pregnancy: Secondary | ICD-10-CM

## 2022-10-03 DIAGNOSIS — O0990 Supervision of high risk pregnancy, unspecified, unspecified trimester: Secondary | ICD-10-CM

## 2022-10-03 DIAGNOSIS — O26893 Other specified pregnancy related conditions, third trimester: Secondary | ICD-10-CM | POA: Diagnosis not present

## 2022-10-03 DIAGNOSIS — O99283 Endocrine, nutritional and metabolic diseases complicating pregnancy, third trimester: Secondary | ICD-10-CM

## 2022-10-03 DIAGNOSIS — Z6791 Unspecified blood type, Rh negative: Secondary | ICD-10-CM | POA: Diagnosis not present

## 2022-10-03 MED ORDER — RHO D IMMUNE GLOBULIN 1500 UNIT/2ML IJ SOSY
300.0000 ug | PREFILLED_SYRINGE | Freq: Once | INTRAMUSCULAR | Status: AC
  Administered 2022-10-03: 300 ug via INTRAMUSCULAR

## 2022-10-03 NOTE — Patient Instructions (Signed)
We will follow up your sugar test results  We discussed VBAC today

## 2022-10-03 NOTE — Progress Notes (Signed)
Pt reports fetal movement, denies pain. Declined tdap. Rhogam administered today

## 2022-10-03 NOTE — Progress Notes (Signed)
PRENATAL VISIT NOTE  Subjective:  Heather Kelly is a 41 y.o. [id number] at [redacted]w[redacted]d being seen today for ongoing prenatal care.  She is currently monitored for the following issues for this high-risk pregnancy and has Supervision of high risk pregnancy, antepartum; AMA (advanced maternal age) multigravida 19+; History of 2 cesarean deliveries, currently pregnant; Hypothyroidism affecting pregnancy; Alpha thalassemia silent carrier; Obesity affecting pregnancy, antepartum; and OSA on CPAP on their problem list.  Patient reports backache, nausea, no contractions, no cramping, and no leaking.  Contractions: Not present. Vag. Bleeding: None.  Movement: Present. Denies leaking of fluid.   Back pain has improved since MAU visit. Feeling nauseous this AM after taking glucola drink. Has questions regarding mode of delivery. Reports first cesarean was for arrest of dilation, feels it was related to early epidural in labor.  Epidural was received at around 1 cm dilation.  Subsequent pregnancy indication reported to be for history of prior cesarean section and not allowed to [facility]  Has now been a few years and would like to VBAC.  Has read extensively on vaginal delivery after cesarean.  Understands there is risk of uterine rupture.   The following portions of the patient's history were reviewed and updated as appropriate: allergies, current medications, past family history, past medical history, past social history, past surgical history and problem list.   Objective:   Vitals:   10/03/22 0852  BP: 111/70  Pulse: 67  Weight: 240 lb 3.2 oz (109 kg)    Fetal Status:     Movement: Present     General:  Alert, oriented and cooperative. Patient is in no acute distress.  Skin: Skin is warm and dry. No rash noted.   Cardiovascular: Normal heart rate noted  Respiratory: Normal respiratory effort, no problems with respiration noted  Abdomen: Soft, gravid, appropriate for gestational age.  Pain/Pressure: Absent      Pelvic: Cervical exam deferred        Extremities: Normal range of motion.  Edema: None  Mental Status: Normal mood and affect. Normal behavior. Normal judgment and thought content.   Assessment and Plan:  Pregnancy: [id number] at [redacted]w[redacted]d 1. Supervision of high risk pregnancy, antepartum GTT completed today - if abnormal consider repeat or FS monitoring x1 week as recently completed prednisone pack  - Glucose Tolerance, 2 Hours w/1 Hour - RPR - CBC - HIV antibody (with reflex) - rho (d) immune globulin (RHIG/RHOPHYLAC) injection 300 mcg  2. Hypothyroidism affecting pregnancy in third trimester Follow with endocrinologist  3. [redacted] weeks gestation of pregnancy Screening completed  4. Rh negative state in antepartum period, third trimester Rhogam today  - rho (d) immune globulin (RHIG/RHOPHYLAC) injection 300 mcg  5. History of 2 cesarean deliveries, currently pregnant Discussed in great detail risk of VBAC given history of 2 prior cesarean deliveries. Reviewed that based on reported history of indication for cesarean deliveries, it is likely that she may have success with vaginal delivery.  It has been years since her last delivery.  Also discussed there is no contraindication at this time to vaginal delivery based on patient report.  We will need to review prior operative reports for cesarean deliveries.  Patient understands there is indication for early term or preterm delivery, will have a repeat cesarean as she cannot be induced.  Patient plans additional pregnancies in the future, therefore for delivery report to her.  Understand the risk of uterine rupture, maternal hemorrhage, maternal demise, and fetal demise. At this time would like  to proceed with VBAC at this time.    Preterm labor symptoms and general obstetric precautions including but not limited to vaginal bleeding, contractions, leaking of fluid and fetal movement were reviewed in detail with the patient. Please refer to  After Visit Summary for other counseling recommendations.   No follow-ups on file.  Future Appointments  Date Time Provider [place]  11/12/2022  1:00 PM Monico Hoar, [place] [facility]  11/19/2022  1:00 PM Monico Hoar, [place] [facility]  12/03/2022  1:00 PM Monico Hoar, PT [facility]    Darliss Cheney, MD

## 2022-10-04 LAB — CBC
Hematocrit: 37.2 % (ref 34.0–46.6)
Hemoglobin: 12.3 g/dL (ref 11.1–15.9)
MCH: 27.5 pg (ref 26.6–33.0)
MCHC: 33.1 g/dL (ref 31.5–35.7)
MCV: 83 fL (ref 79–97)
Platelets: 301 10*3/uL (ref 150–450)
RBC: 4.48 x10E6/uL (ref 3.77–5.28)
RDW: 12.4 % (ref 11.7–15.4)
WBC: 7.9 10*3/uL (ref 3.4–10.8)

## 2022-10-04 LAB — GLUCOSE TOLERANCE, 2 HOURS W/ 1HR
Glucose, 1 hour: 152 mg/dL (ref 70–179)
Glucose, 2 hour: 90 mg/dL (ref 70–152)
Glucose, Fasting: 68 mg/dL — ABNORMAL LOW (ref 70–91)

## 2022-10-04 LAB — HIV ANTIBODY (ROUTINE TESTING W REFLEX): HIV Screen 4th Generation wRfx: NONREACTIVE

## 2022-10-04 LAB — RPR: RPR Ser Ql: NONREACTIVE

## 2022-10-10 ENCOUNTER — Encounter: Payer: Medicaid Other | Admitting: Physical Therapy

## 2022-10-10 ENCOUNTER — Encounter: Payer: Self-pay | Admitting: Physical Therapy

## 2022-10-10 DIAGNOSIS — O99891 Other specified diseases and conditions complicating pregnancy: Secondary | ICD-10-CM | POA: Diagnosis not present

## 2022-10-10 DIAGNOSIS — M5459 Other low back pain: Secondary | ICD-10-CM

## 2022-10-10 NOTE — Therapy (Signed)
OUTPATIENT PHYSICAL THERAPY TREATMENT NOTE   Patient Name: Heather Kelly MRN: [id number] DOB:10-Aug-1981, 41 y.o., female Today's Date: 10/10/2022  PCP: Lupita Raider, MD REFERRING PROVIDER:  Brock Bad, MD  END OF SESSION:   PT End of Session - 10/10/22 1136     Visit Number 2    Date for PT Re-Evaluation 12/10/22    Authorization Type [employer]    Authorization Time Period 10/17-12/16    Authorization - Visit Number 2    Authorization - Number of Visits 10    PT Start Time 1130    PT Stop Time 1215    PT Time Calculation (min) 45 min    Activity Tolerance Patient tolerated treatment well    Behavior During Therapy [facility] for tasks assessed/performed             Past Medical History:  Diagnosis Date   Hypothyroidism    Past Surgical History:  Procedure Laterality Date   BARIATRIC SURGERY  04/10/2021   CESAREAN SECTION  2011   CESAREAN SECTION  2007   Patient Active Problem List   Diagnosis Date Noted   Obesity affecting pregnancy, antepartum 08/08/2022   Alpha thalassemia silent carrier 06/20/2022   AMA (advanced maternal age) multigravida 35+ 06/05/2022   History of 2 cesarean deliveries, currently pregnant 06/05/2022   Hypothyroidism affecting pregnancy 06/05/2022   Supervision of high risk pregnancy, antepartum 05/17/2022   OSA on CPAP 01/23/2021   REFERRING DIAG: O99.891,M54.9 (ICD-10-CM) - Back pain affecting pregnancy in second trimester   THERAPY DIAG:  Other low back pain   Rationale for Evaluation and Treatment Rehabilitation   ONSET DATE: 09/20/2022   SUBJECTIVE:                                                                                                                                                                                            SUBJECTIVE STATEMENT:  I am feeling better since last visit. Walking is easier. I have more motion. I  am stiff in the back and hip area. Bending is difficult.    PAIN:  Are you having pain?  Yes NPRS scale: 4/10 back; 6/10 left hip Pain location:  low back, right hip to the tailbone   Pain type: soreness Pain description: intermittent    Aggravating factors: sitting, standing, positions that support her weight Relieving factors: laying down   PRECAUTIONS: Other: pregnant   WEIGHT BEARING RESTRICTIONS No   FALLS:  Has patient fallen in last 6 months? No   LIVING ENVIRONMENT: Lives with: lives with their family   OCCUPATION: none   PLOF: Independent   PATIENT GOALS  not be in pain   PERTINENT HISTORY:  Pregnant; Hypothyroidism; c-section x2; bariatric surgery 04/10/2021     PREGNANCY Vaginal deliveries 0 C-section deliveries 2 Currently pregnant Yes: due 12/18/22 ; children age is 36 and 55 year old         OBJECTIVE:    DIAGNOSTIC FINDINGS:  Herniated disc between L5-S1   PATIENT SURVEYS:  Modified Oswestry 33 points severe disability     COGNITION:            Overall cognitive status: Within functional limits for tasks assessed                          SENSATION:            Light touch: Appears intact            Proprioception: Appears intact       LUMBAR SPECIAL TESTS:  Straight leg raise test: Positive and Gaenslen's test: Positive on right for both, right pain goes to right trochanter                  POSTURE: rounded shoulders, forward head, decreased lumbar lordosis, weight shift left, and hips shifted left               PELVIC ALIGNMENT: right ilium is anteriorly rotated   LUMBARAROM/PROM   A/PROM A/PROM  eval  Flexion Decreased by 75%  Extension Decreased by 75%  Right lateral flexion Decreased by 75%  Left lateral flexion Decreased by 75%  Right rotation Decreased by 75%  Left rotation Decreased by 75%   (Blank rows = not tested)          PALPATION:   General  tenderness located on the right quadratus, lumbar paraspinals, gluteals and right levator ani                   TODAY'S TREATMENT  10/10/2022 Manual: Soft  tissue mobilization:Manual work to the right gluteal in left sidely to reduce pain Educated patient on how to use a ball to massage the right gluteal in sitting  Exercises: Stretches/mobility:sidely piriformis hold 30 sec on the right side 2x Single knee to chest  on the right holding for 30 sec 2x On left side open book stretch 15x Strengthening: Sitting with yoga block between knees and moving knees back and forth to open up the SI joint Lateral shift on the wall    EVAL Date: 10/01/2022 HEP established-see below        PATIENT EDUCATION:  10/10/2022 Education details: [email or web address];  Person educated: Patient Education method: Explanation, Demonstration, Corporate treasurer cues, Verbal cues, and Handouts Education comprehension: verbalized understanding, returned demonstration, verbal cues required, tactile cues required, and needs further education     HOME EXERCISE PROGRAM: 10/10/2022  Access Code: [email or web address] URL: [email or web address] Date: 10/10/2022 Prepared by: Earlie Counts  Program Notes sit with ball between knees and shift hips forward adn back 10xsit on a hard ball  and massage into the buttocks  Exercises - Lateral Shift Correction at Wall  - 1 x daily - 7 x weekly - 1 sets - 5 reps - Sidelying Thoracic Rotation with Open Book  - 1 x daily - 7 x weekly - 1 sets - 10 reps - Sidelying Single Knee to Chest Stretch  - 1 x daily - 7 x weekly - 1 sets - 2 reps - 30 sec hold - Supine Figure 4 Piriformis Stretch  - 1 x  daily - 7 x weekly - 1 sets - 2 reps - 30 sec hold  ASSESSMENT:   CLINICAL IMPRESSION: Patient is a 41 y.o. female who was seen today for physical therapy treatment for low back pain . Patient is able to walk with greater ease. She reports her back does not have ain just tightness. Her right gluteal pain is 6/10. She has trigger points in the right piriformis.  Patietn is till do ing the lateral shift to centralize pain and hopefully next visit she can do back  extension.  Patient will benefit from skilled therapy to reduce her back pain and improve her mobility during her pregnancy to improve her function.      OBJECTIVE IMPAIRMENTS decreased activity tolerance, decreased coordination, decreased mobility, decreased ROM, increased muscle spasms, and pain.    ACTIVITY LIMITATIONS carrying, lifting, bending, sitting, standing, squatting, sleeping, stairs, transfers, bed mobility, bathing, and dressing   PARTICIPATION LIMITATIONS: meal prep, cleaning, laundry, driving, shopping, and community activity   PERSONAL FACTORS Age, Time since onset of injury/illness/exacerbation, and 1-2 comorbidities: Pregnant; Hypothyroidism; c-section x2; bariatric surgery 04/10/2021  are also affecting patient's functional outcome.    REHAB POTENTIAL: Excellent   CLINICAL DECISION MAKING: Evolving/moderate complexity   EVALUATION COMPLEXITY: Moderate     GOALS: Goals reviewed with patient? Yes   SHORT TERM GOALS: Target date: 10/29/2022   Patient independent with lumbar exercise program to move her disc off the nerve and centralize her back pain.  Baseline:Patient pain is in the right buttocks Goal status: INITIAL     LONG TERM GOALS: Target date: 12/10/2022    Patient independent with advanced HEP for back and SI stabilization exercises.  Baseline: Not educated yet Goal status: INITIAL   2.  Patient reports her back pain decreased to 2/10 or less due to centralizing her back pain.  Baseline: pain level 6/10 and goies into the right buttocks Goal status: INITIAL   3.  Patient educated on correct body mechanics to reduce the strain on her back during her pregnancy and reduce her pain.  Baseline: not educated yet.  Goal status: INITIAL   4.  Modified Oswestry score decreased </= 4 so she is able to perform her daily activities with greater ease.  Baseline: score is 33 Goal status: INITIAL   5.  Patient educated on ways to give birth to decrease strain  on her back.  Baseline: Not educated yet Goal status: INITIAL     PLAN: PT FREQUENCY: 1x/week   PT DURATION: 10 weeks   PLANNED INTERVENTIONS: Therapeutic exercises, Therapeutic activity, Neuromuscular re-education, Patient/Family education, Self Care, Joint mobilization, Spinal mobilization, Cryotherapy, Moist heat, and Manual therapy   PLAN FOR NEXT SESSION: see if pelvis is balanced,  if pain centralized then do some back extension, quadruped back exercises,   Eulis Foster, PT 10/10/22 12:28 PM

## 2022-10-17 ENCOUNTER — Ambulatory Visit (INDEPENDENT_AMBULATORY_CARE_PROVIDER_SITE_OTHER): Payer: Medicaid Other | Admitting: Student

## 2022-10-17 ENCOUNTER — Encounter: Payer: Self-pay | Admitting: Student

## 2022-10-17 VITALS — BP 126/74 | HR 73 | Wt 239.8 lb

## 2022-10-17 DIAGNOSIS — Z3A31 31 weeks gestation of pregnancy: Secondary | ICD-10-CM

## 2022-10-17 DIAGNOSIS — O99891 Other specified diseases and conditions complicating pregnancy: Secondary | ICD-10-CM

## 2022-10-17 DIAGNOSIS — Z6791 Unspecified blood type, Rh negative: Secondary | ICD-10-CM

## 2022-10-17 DIAGNOSIS — M549 Dorsalgia, unspecified: Secondary | ICD-10-CM

## 2022-10-17 DIAGNOSIS — O26893 Other specified pregnancy related conditions, third trimester: Secondary | ICD-10-CM

## 2022-10-17 DIAGNOSIS — E039 Hypothyroidism, unspecified: Secondary | ICD-10-CM

## 2022-10-17 DIAGNOSIS — O09529 Supervision of elderly multigravida, unspecified trimester: Secondary | ICD-10-CM

## 2022-10-17 DIAGNOSIS — O0993 Supervision of high risk pregnancy, unspecified, third trimester: Secondary | ICD-10-CM

## 2022-10-17 DIAGNOSIS — O99283 Endocrine, nutritional and metabolic diseases complicating pregnancy, third trimester: Secondary | ICD-10-CM

## 2022-10-17 DIAGNOSIS — O99210 Obesity complicating pregnancy, unspecified trimester: Secondary | ICD-10-CM

## 2022-10-17 DIAGNOSIS — O0990 Supervision of high risk pregnancy, unspecified, unspecified trimester: Secondary | ICD-10-CM

## 2022-10-17 DIAGNOSIS — O99213 Obesity complicating pregnancy, third trimester: Secondary | ICD-10-CM

## 2022-10-17 NOTE — Progress Notes (Signed)
Pt presents for ROB visit. No concerns at this time.  

## 2022-10-17 NOTE — Progress Notes (Signed)
   PRENATAL VISIT NOTE  Subjective:  Heather Kelly is a 41 y.o. [id number] at [redacted]w[redacted]d being seen today for ongoing prenatal care.  She is currently monitored for the following issues for this high-risk pregnancy and has Supervision of high risk pregnancy, antepartum; AMA (advanced maternal age) multigravida 58+; History of 2 cesarean deliveries, currently pregnant; Hypothyroidism affecting pregnancy; Alpha thalassemia silent carrier; Obesity affecting pregnancy, antepartum; and OSA on CPAP on their problem list.  Patient reports no complaints.  Contractions: Irritability. Vag. Bleeding: None.  Movement: Present. Denies leaking of fluid.   The following portions of the patient's history were reviewed and updated as appropriate: allergies, current medications, past family history, past medical history, past social history, past surgical history and problem list.   Objective:   Vitals:   10/17/22 0938  BP: 126/74  Pulse: 73  Weight: 239 lb 12.8 oz (108.8 kg)    Fetal Status: Fetal Heart Rate (bpm): 142 Fundal Height: 32 cm Movement: Present     General:  Alert, oriented and cooperative. Patient is in no acute distress.  Skin: Skin is warm and dry. No rash noted.   Cardiovascular: Normal heart rate noted  Respiratory: Normal respiratory effort, no problems with respiration noted  Abdomen: Soft, gravid, appropriate for gestational age.  Pain/Pressure: Absent     Pelvic: Cervical exam deferred        Extremities: Normal range of motion.  Edema: None  Mental Status: Normal mood and affect. Normal behavior. Normal judgment and thought content.   Assessment and Plan:  Pregnancy: [id number] at [redacted]w[redacted]d 1. Supervision of high risk pregnancy, antepartum - Doing well, vigorous fetal movement - [place] [facility] OB FOLLOW UP; Future  2. [redacted] weeks gestation of pregnancy - Continue routine follow-up  3. Rh negative state in antepartum period, third trimester -Rhogam administered at prior visit  4. Back pain  affecting pregnancy in second trimester - Followed by physical therapy  5. Hypothyroidism affecting pregnancy in third trimester - Followed by endocrinology  - Currently taking Synthroid 43mcg - [place] [facility] OB FOLLOW UP; Future  6. Obesity affecting pregnancy, antepartum, unspecified obesity type - Taking BASA - [place] [facility] FOLLOW UP; Future  7. Antepartum multigravida of advanced maternal age - [place] [facility] OB FOLLOW UP; Future  Preterm labor symptoms and general obstetric precautions including but not limited to vaginal bleeding, contractions, leaking of fluid and fetal movement were reviewed in detail with the patient. Please refer to After Visit Summary for other counseling recommendations.   Return in about 2 weeks (around 10/31/2022) for [facility], IN-PERSON.  Future Appointments  Date Time Provider [place]  10/22/2022  1:00 PM Monico Hoar, PT [facility]  10/31/2022 11:15 AM Laury Deep, CNM [place] None  11/05/2022  1:00 PM Monico Hoar, PT [facility]  11/12/2022  1:00 PM Monico Hoar, PT [facility]  11/14/2022 11:15 AM Deloris Ping, CNM [facility] None  11/19/2022  1:00 PM Monico Hoar, PT [facility]  11/21/2022 11:15 AM Elly Modena, Vickii Chafe, MD [place] None  11/28/2022 10:55 AM Laury Deep, CNM [place] None  12/03/2022  1:00 PM Monico Hoar, PT [facility]  12/04/2022 10:35 AM Johnston Ebbs, NP [place] None  12/13/2022  9:55 AM Inez Catalina, MD [facility] None    Johnston Ebbs, NP

## 2022-10-17 NOTE — Progress Notes (Deleted)
Pt presents for ROB visit.  

## 2022-10-22 ENCOUNTER — Encounter: Payer: Self-pay | Admitting: Physical Therapy

## 2022-10-22 ENCOUNTER — Encounter: Payer: Medicaid Other | Attending: Obstetrics | Admitting: Physical Therapy

## 2022-10-22 DIAGNOSIS — M5459 Other low back pain: Secondary | ICD-10-CM | POA: Diagnosis present

## 2022-10-22 DIAGNOSIS — M549 Dorsalgia, unspecified: Secondary | ICD-10-CM | POA: Insufficient documentation

## 2022-10-22 DIAGNOSIS — O99891 Other specified diseases and conditions complicating pregnancy: Secondary | ICD-10-CM | POA: Insufficient documentation

## 2022-10-22 NOTE — Therapy (Signed)
OUTPATIENT PHYSICAL THERAPY TREATMENT NOTE   Patient Name: Heather Kelly MRN: [id number] DOB:[birth year], 41 y.o., female Today's Date: 10/22/2022  PCP: Mayra Neer, MD  REFERRING PROVIDER:  Shelly Bombard, MD   END OF SESSION:   PT End of Session - 10/22/22 1304     Visit Number 3    Date for PT Re-Evaluation 12/10/22    Authorization Type [employer]    Authorization Time Period 10/17-12/16    Authorization - Visit Number 3    Authorization - Number of Visits 10    PT Start Time 1300    PT Stop Time 1345    PT Time Calculation (min) 45 min    Activity Tolerance Patient tolerated treatment well    Behavior During Therapy [facility] for tasks assessed/performed             Past Medical History:  Diagnosis Date   Hypothyroidism    Past Surgical History:  Procedure Laterality Date   BARIATRIC SURGERY  04/10/2021   CESAREAN SECTION  2011   CESAREAN SECTION  2007   Patient Active Problem List   Diagnosis Date Noted   Obesity affecting pregnancy, antepartum 08/08/2022   Alpha thalassemia silent carrier 06/20/2022   AMA (advanced maternal age) multigravida 35+ 06/05/2022   History of 2 cesarean deliveries, currently pregnant 06/05/2022   Hypothyroidism affecting pregnancy 06/05/2022   Supervision of high risk pregnancy, antepartum 05/17/2022   OSA on CPAP 01/23/2021   REFERRING DIAG: O99.891,M54.9 (ICD-10-CM) - Back pain affecting pregnancy in second trimester   THERAPY DIAG:  Other low back pain   Rationale for Evaluation and Treatment Rehabilitation   ONSET DATE: 09/20/2022   SUBJECTIVE:                                                                                                                                                                                            SUBJECTIVE STATEMENT: Less pain in the back. Decreased mobility of the back. The SI joint is not doing well. Pain is going down to my mid thigh on the right.    PAIN:  Are you having pain?  Yes NPRS scale: 6/10 back; 8/10 right hip Pain location:  low back, right hip to the tailbone   Pain type: soreness Pain description: intermittent    Aggravating factors:standing, positions that support her weight, walking around, bending to pick up items Relieving factors: laying down   PRECAUTIONS: Other: pregnant   WEIGHT BEARING RESTRICTIONS No   FALLS:  Has patient fallen in last 6 months? No   LIVING ENVIRONMENT: Lives with: lives with their family   OCCUPATION: none  PLOF: Independent   PATIENT GOALS not be in pain   PERTINENT HISTORY:  Pregnant; Hypothyroidism; c-section x2; bariatric surgery 04/10/2021     PREGNANCY Vaginal deliveries 0 C-section deliveries 2 Currently pregnant Yes: due 12/18/22 ; children age is 38 and 63 year old         OBJECTIVE:    DIAGNOSTIC FINDINGS:  Herniated disc between L5-S1   PATIENT SURVEYS:  Modified Oswestry 33 points severe disability     COGNITION:            Overall cognitive status: Within functional limits for tasks assessed                          SENSATION:            Light touch: Appears intact            Proprioception: Appears intact       LUMBAR SPECIAL TESTS:  Straight leg raise test: Positive and Gaenslen's test: Positive on right for both, right pain goes to right trochanter                  POSTURE: rounded shoulders, forward head, decreased lumbar lordosis, weight shift left, and hips shifted left               PELVIC ALIGNMENT: right ilium is posteriorly rotated but after therapy it is equal   LUMBARAROM/PROM   A/PROM A/PROM  eval  Flexion Decreased by 75%  Extension Decreased by 75%  Right lateral flexion Decreased by 75%  Left lateral flexion Decreased by 75%  Right rotation Decreased by 75%  Left rotation Decreased by 75%   (Blank rows = not tested)      PALPATION:   General  tenderness located on the right quadratus, lumbar paraspinals, gluteals and right levator ani                    TODAY'S TREATMENT  10/22/2022 Manual: Soft tissue mobilization: using the Addaday to the right gluteus medius, piriformis, and SI joint in sidey Spinal mobilization:Muscle energy to correct right ilium posterior rotation Gapping of the right side of L1-L5 and T3-T5 in left sidely Mobilize the left SI joint with a towel along the SI joint and therapist gliding the right hip posteriorly Exercises: Strengthening: supine clam with red band 10x Bridge with red band around the knees Sitting knee flexion red band 15 x 2  Self-care: Educated patient on what props are at the [facility] hospital to assist with positioning at birth.     10/10/2022 Manual: Soft tissue mobilization:Manual work to the right gluteal in left sidely to reduce pain Educated patient on how to use a ball to massage the right gluteal in sitting  Exercises: Stretches/mobility:sidely piriformis hold 30 sec on the right side 2x Single knee to chest  on the right holding for 30 sec 2x On left side open book stretch 15x Strengthening: Sitting with yoga block between knees and moving knees back and forth to open up the SI joint Lateral shift on the wall        PATIENT EDUCATION:  10/22/2022 Education details: [email or web address];  Person educated: Patient Education method: Explanation, Demonstration, Tactile cues, Verbal cues, and Handouts Education comprehension: verbalized understanding, returned demonstration, verbal cues required, tactile cues required, and needs further education     HOME EXERCISE PROGRAM: 10/22/2022/2023 Access Code: [email or web address] URL: [email or web address] Date: 10/22/2022 Prepared by: Eulis Foster  Program Notes sit  with ball between knees and shift hips forward adn back 10xsit on a hard ball  and massage into the buttocks  Exercises - Hooklying Clamshell with Resistance  - 1 x daily - 3 x weekly - 1 sets - 10 reps - Supine Bridge with Resistance Band  - 1 x daily - 3 x weekly - 1 sets -  10 reps - Seated Hamstring Curl with Anchored Resistance  - 1 x daily - 3 x weekly - 2 sets - 10 reps     ASSESSMENT:   CLINICAL IMPRESSION: Patient is a 41 y.o. female who was seen today for physical therapy treatment for low back pain .  Patient feels her back is better. She has more movement in lumbar. She SI joint continues to be 6/10 and pain is down to down to mid thigh. Patient pelvis in correct alignment after manual work. She is able to do a bridge better with less pain.   Patient will benefit from skilled therapy to reduce her back pain and improve her mobility during her pregnancy to improve her function.      OBJECTIVE IMPAIRMENTS decreased activity tolerance, decreased coordination, decreased mobility, decreased ROM, increased muscle spasms, and pain.    ACTIVITY LIMITATIONS carrying, lifting, bending, sitting, standing, squatting, sleeping, stairs, transfers, bed mobility, bathing, and dressing   PARTICIPATION LIMITATIONS: meal prep, cleaning, laundry, driving, shopping, and community activity   PERSONAL FACTORS Age, Time since onset of injury/illness/exacerbation, and 1-2 comorbidities: Pregnant; Hypothyroidism; c-section x2; bariatric surgery 04/10/2021  are also affecting patient's functional outcome.    REHAB POTENTIAL: Excellent   CLINICAL DECISION MAKING: Evolving/moderate complexity   EVALUATION COMPLEXITY: Moderate     GOALS: Goals reviewed with patient? Yes   SHORT TERM GOALS: Target date: 10/29/2022   Patient independent with lumbar exercise program to move her disc off the nerve and centralize her back pain.  Baseline:Patient pain is in the right buttocks Goal status: ongoing 10/22/2022     LONG TERM GOALS: Target date: 12/10/2022    Patient independent with advanced HEP for back and SI stabilization exercises.  Baseline: Not educated yet Goal status: ongoing  10/22/2022   2.  Patient reports her back pain decreased to 2/10 or less due to centralizing  her back pain.  Baseline: pain level 6/10 and goies into the right buttocks Goal status: INITIAL   3.  Patient educated on correct body mechanics to reduce the strain on her back during her pregnancy and reduce her pain.  Baseline: not educated yet.  Goal status: INITIAL   4.  Modified Oswestry score decreased </= 4 so she is able to perform her daily activities with greater ease.  Baseline: score is 33 Goal status: INITIAL   5.  Patient educated on ways to give birth to decrease strain on her back.  Baseline: Not educated yet Goal status: INITIAL     PLAN: PT FREQUENCY: 1x/week   PT DURATION: 10 weeks   PLANNED INTERVENTIONS: Therapeutic exercises, Therapeutic activity, Neuromuscular re-education, Patient/Family education, Self Care, Joint mobilization, Spinal mobilization, Cryotherapy, Moist heat, and Manual therapy   PLAN FOR NEXT SESSION: see if pelvis is balanced,  if pain centralized then do some back extension, quadruped back exercises, body mechanics with daily activities  Eulis Foster, PT 10/22/22 2:00 PM

## 2022-10-29 ENCOUNTER — Encounter: Payer: Medicaid Other | Admitting: Physical Therapy

## 2022-10-29 ENCOUNTER — Encounter: Payer: Self-pay | Admitting: Physical Therapy

## 2022-10-29 DIAGNOSIS — O99891 Other specified diseases and conditions complicating pregnancy: Secondary | ICD-10-CM | POA: Diagnosis not present

## 2022-10-29 DIAGNOSIS — M5459 Other low back pain: Secondary | ICD-10-CM

## 2022-10-29 NOTE — Therapy (Signed)
OUTPATIENT PHYSICAL THERAPY TREATMENT NOTE   Patient Name: Heather Kelly MRN: [id number] DOB:August 16, 1981, 41 y.o., female Today's Date: 10/29/2022  PCP: Lupita Raider, MD   REFERRING PROVIDER: Brock Bad, MD    END OF SESSION:   PT End of Session - 10/29/22 1605     Visit Number 4    Date for PT Re-Evaluation 12/10/22    Authorization Type [employer]    Authorization Time Period 10/17-12/16    Authorization - Visit Number 4    Authorization - Number of Visits 10    PT Start Time 1600    PT Stop Time 1645    PT Time Calculation (min) 45 min    Activity Tolerance Patient tolerated treatment well    Behavior During Therapy [facility] for tasks assessed/performed             Past Medical History:  Diagnosis Date   Hypothyroidism    Past Surgical History:  Procedure Laterality Date   BARIATRIC SURGERY  04/10/2021   CESAREAN SECTION  2011   CESAREAN SECTION  2007   Patient Active Problem List   Diagnosis Date Noted   Obesity affecting pregnancy, antepartum 08/08/2022   Alpha thalassemia silent carrier 06/20/2022   AMA (advanced maternal age) multigravida 35+ 06/05/2022   History of 2 cesarean deliveries, currently pregnant 06/05/2022   Hypothyroidism affecting pregnancy 06/05/2022   Supervision of high risk pregnancy, antepartum 05/17/2022   OSA on CPAP 01/23/2021   REFERRING DIAG: O99.891,M54.9 (ICD-10-CM) - Back pain affecting pregnancy in second trimester   THERAPY DIAG:  Other low back pain   Rationale for Evaluation and Treatment Rehabilitation   ONSET DATE: 09/20/2022   SUBJECTIVE:                                                                                                                                                                                            SUBJECTIVE STATEMENT: Mobility is better in the back. Not bad when I bend down. Need to bend down slowly. The SI joint is not letting up. I am using the massage gone on the areas. I am not  carrying heavy things. More pain on the right side. No numbness or tingling. Pain goes to the back of right thigh.    PAIN:  Are you having pain? Yes NPRS scale: 6/10 back; 8/10 right hip Pain location:  low back, right hip to the tailbone   Pain type: soreness Pain description: intermittent    Aggravating factors:standing, positions that support her weight, walking around, bending to pick up items Relieving factors: laying down   PRECAUTIONS: Other: pregnant   WEIGHT  BEARING RESTRICTIONS No   FALLS:  Has patient fallen in last 6 months? No   LIVING ENVIRONMENT: Lives with: lives with their family   OCCUPATION: none   PLOF: Independent   PATIENT GOALS not be in pain   PERTINENT HISTORY:  Pregnant; Hypothyroidism; c-section x2; bariatric surgery 04/10/2021     PREGNANCY Vaginal deliveries 0 C-section deliveries 2 Currently pregnant Yes: due 12/18/22 ; children age is 1215 and 39 year old      OBJECTIVE: (objective measures completed at initial evaluation unless otherwise dated)   (Copy Eval's Objective through Plan section here)     DIAGNOSTIC FINDINGS:  Herniated disc between L5-S1   PATIENT SURVEYS:  Modified Oswestry 33 points severe disability     COGNITION:            Overall cognitive status: Within functional limits for tasks assessed                          SENSATION:            Light touch: Appears intact            Proprioception: Appears intact       LUMBAR SPECIAL TESTS:  Straight leg raise test: Positive and Gaenslen's test: Positive on right for both, right pain goes to right trochanter                  POSTURE: rounded shoulders, forward head, decreased lumbar lordosis, weight shift left, and hips shifted left               PELVIC ALIGNMENT: right ilium is posteriorly rotated but after therapy it is equal   LUMBARAROM/PROM   A/PROM A/PROM  eval  Flexion Decreased by 75%  Extension Decreased by 75%  Right lateral flexion Decreased by  75%  Left lateral flexion Decreased by 75%  Right rotation Decreased by 75%  Left rotation Decreased by 75%   (Blank rows = not tested)      PALPATION:   General  tenderness located on the right quadratus, lumbar paraspinals, gluteals and right levator ani                   TODAY'S TREATMENT  10/29/2022 Manual: Soft tissue mobilization: to right gluteal, around the posterior right hip, right ITB, right levator ani Exercises: Stretches/mobility:  quadruped with hips IR rocking back and forth Quadruped with right knee on yoga block moving anterior and posterior, side to side to stretch the right SI joint Strengthening:  Quadruped with right knee on yoga block and moving left hip in line with right Right foot on step with left hand reaching to right foot Standing in mini squat bilateral shoulder extension with red band Standing in mini squat shoulder row with red band Self-care: Educated patient on how to massage her back, gluteals, and levator ani with massage gun and which massage heads to use    10/22/2022 Manual: Soft tissue mobilization: using the Addaday to the right gluteus medius, piriformis, and SI joint in sidey Spinal mobilization:Muscle energy to correct right ilium posterior rotation Gapping of the right side of L1-L5 and T3-T5 in left sidely Mobilize the left SI joint with a towel along the SI joint and therapist gliding the right hip posteriorly Exercises: Strengthening: supine clam with red band 10x Bridge with red band around the knees Sitting knee flexion red band 15 x 2   Self-care: Educated patient on what props  are at the [facility] hospital to assist with positioning at birth.     10/10/2022 Manual: Soft tissue mobilization:Manual work to the right gluteal in left sidely to reduce pain Educated patient on how to use a ball to massage the right gluteal in sitting  Exercises: Stretches/mobility:sidely piriformis hold 30 sec on the right side 2x Single knee  to chest  on the right holding for 30 sec 2x On left side open book stretch 15x Strengthening: Sitting with yoga block between knees and moving knees back and forth to open up the SI joint Lateral shift on the wall        PATIENT EDUCATION:  10/19/2022 Education details: [email or web address];  Person educated: Patient Education method: Explanation, Demonstration, Tactile cues, Verbal cues, and Handouts Education comprehension: verbalized understanding, returned demonstration, verbal cues required, tactile cues required, and needs further education     HOME EXERCISE PROGRAM: 10/29/2022/2023  Access Code: [email or web address] URL: [email or web address] Date: 10/29/2022 Prepared by: Eulis Foster  Program Notes sit with ball between knees and shift hips forward adn back 10xsit on a hard ball  and massage into the buttocks  Exercises  - Quadruped Yoga Block Lift Off  - 1 x daily - 7 x weekly - 3 sets - 10 reps - Squatting Shoulder Row with Anchored Resistance  - 1 x daily - 7 x weekly - 3 sets - 10 reps     ASSESSMENT:   CLINICAL IMPRESSION: Patient is a 41 y.o. female who was seen today for physical therapy treatment for low back pain . Patient has more mobility in her back. She had trigger points in the right levator anil. Patient pain decreased to 2/10 after manual work and she was able to fully flex her right hip in standng without pain.    Patient will benefit from skilled therapy to reduce her back pain and improve her mobility during her pregnancy to improve her function.      OBJECTIVE IMPAIRMENTS decreased activity tolerance, decreased coordination, decreased mobility, decreased ROM, increased muscle spasms, and pain.    ACTIVITY LIMITATIONS carrying, lifting, bending, sitting, standing, squatting, sleeping, stairs, transfers, bed mobility, bathing, and dressing   PARTICIPATION LIMITATIONS: meal prep, cleaning, laundry, driving, shopping, and community activity   PERSONAL FACTORS  Age, Time since onset of injury/illness/exacerbation, and 1-2 comorbidities: Pregnant; Hypothyroidism; c-section x2; bariatric surgery 04/10/2021  are also affecting patient's functional outcome.    REHAB POTENTIAL: Excellent   CLINICAL DECISION MAKING: Evolving/moderate complexity   EVALUATION COMPLEXITY: Moderate     GOALS: Goals reviewed with patient? Yes   SHORT TERM GOALS: Target date: 10/29/2022   Patient independent with lumbar exercise program to move her disc off the nerve and centralize her back pain.  Baseline:Patient pain is in the right buttocks Goal status: ongoing 10/22/2022     LONG TERM GOALS: Target date: 12/10/2022    Patient independent with advanced HEP for back and SI stabilization exercises.  Baseline: Not educated yet Goal status: ongoing  10/22/2022   2.  Patient reports her back pain decreased to 2/10 or less due to centralizing her back pain.  Baseline: pain level 6/10 and goies into the right buttocks Goal status: INITIAL   3.  Patient educated on correct body mechanics to reduce the strain on her back during her pregnancy and reduce her pain.  Baseline: not educated yet.  Goal status: INITIAL   4.  Modified Oswestry score decreased </= 4 so she is able to perform her daily activities with greater  ease.  Baseline: score is 33 Goal status: INITIAL   5.  Patient educated on ways to give birth to decrease strain on her back.  Baseline: Not educated yet Goal status: INITIAL     PLAN: PT FREQUENCY: 1x/week   PT DURATION: 10 weeks   PLANNED INTERVENTIONS: Therapeutic exercises, Therapeutic activity, Neuromuscular re-education, Patient/Family education, Self Care, Joint mobilization, Spinal mobilization, Cryotherapy, Moist heat, and Manual therapy   PLAN FOR NEXT SESSION: body mechanics with daily activities, birthing positions to reduce strain on back   Eulis Foster, PT 10/29/22 4:47 PM

## 2022-10-31 ENCOUNTER — Encounter: Payer: Self-pay | Admitting: Obstetrics and Gynecology

## 2022-10-31 ENCOUNTER — Ambulatory Visit (INDEPENDENT_AMBULATORY_CARE_PROVIDER_SITE_OTHER): Payer: Medicaid Other | Admitting: Obstetrics and Gynecology

## 2022-10-31 VITALS — BP 122/73 | HR 93 | Wt 244.2 lb

## 2022-10-31 DIAGNOSIS — Z3A33 33 weeks gestation of pregnancy: Secondary | ICD-10-CM

## 2022-10-31 DIAGNOSIS — O0990 Supervision of high risk pregnancy, unspecified, unspecified trimester: Secondary | ICD-10-CM

## 2022-10-31 DIAGNOSIS — O34219 Maternal care for unspecified type scar from previous cesarean delivery: Secondary | ICD-10-CM

## 2022-10-31 DIAGNOSIS — O99210 Obesity complicating pregnancy, unspecified trimester: Secondary | ICD-10-CM

## 2022-10-31 DIAGNOSIS — O0993 Supervision of high risk pregnancy, unspecified, third trimester: Secondary | ICD-10-CM

## 2022-10-31 NOTE — Progress Notes (Signed)
Pt presents for ROB visit. No concerns at this time.  

## 2022-10-31 NOTE — Patient Instructions (Signed)

## 2022-10-31 NOTE — Progress Notes (Signed)
  HIGH-RISK PREGNANCY OFFICE VISIT Patient name: Heather Kelly MRN [id number]  Date of birth: 27-Sep-1981 Chief Complaint:   Routine Prenatal Visit  History of Present Illness:   Heather Kelly is a 41 y.o. [id number] female at [redacted]w[redacted]d with an Estimated Date of Delivery: 12/18/22 being seen today for ongoing management of a high-risk pregnancy complicated by  h/o previous cesarean delivery x 2 Today she reports no complaints. Contractions: Not present. Vag. Bleeding: None.  Movement: Present. denies leaking of fluid.  Review of Systems:   Pertinent items are noted in HPI Denies abnormal vaginal discharge w/ itching/odor/irritation, headaches, visual changes, shortness of breath, chest pain, abdominal pain, severe nausea/vomiting, or problems with urination or bowel movements unless otherwise stated above. Pertinent History Reviewed:  Reviewed past medical,surgical, social, obstetrical and family history.  Reviewed problem list, medications and allergies. Physical Assessment:   Vitals:   10/31/22 1118  BP: 122/73  Pulse: 93  Weight: 244 lb 3.2 oz (110.8 kg)  Body mass index is 36.06 kg/m.           Physical Examination:   General appearance: alert, well appearing, and in no distress, oriented to person, place, and time, and overweight  Mental status: alert, oriented to person, place, and time, normal mood, behavior, speech, dress, motor activity, and thought processes  Skin: warm & dry   Extremities: Edema: None    Cardiovascular: normal heart rate noted  Respiratory: normal respiratory effort, no distress  Abdomen: gravid, soft, non-tender  Pelvic: Cervical exam deferred         Fetal Status: Fetal Heart Rate (bpm): 140 Fundal Height: 36 cm Movement: Present    Fetal Surveillance Testing today: none   No results found for this or any previous visit (from the past 24 hour(s)).  Assessment & Plan:  1) High-risk pregnancy [id number] at [redacted]w[redacted]d with an Estimated Date of Delivery: 12/18/22   2)  Supervision of high risk pregnancy, antepartum - Anticipatory guidance for GBS screening at 36 wks. Explained the test is important to be done at this time in pregnancy to ensure adequate treatment at the time of delivery. Explained that a positive result does not mean any harm to her, but can be harmful to the baby. Meaning that if baby is exposed to the bacteria for too long without antibiotics, the baby has the potential to develop pneumonia, septicemia, or spinal meningitis and could end up in the NICU. Also, explained that a cervical exam may be performed at the time of testing to get a baseline cervical check and make sure there is no preterm cervical dilation.  3) Obesity affecting pregnancy, antepartum, unspecified obesity type - Taking bASA daily  4) History of 2 cesarean deliveries, currently pregnant - Planning TOLAC - Has doula for labor  5) [redacted] weeks gestation of pregnancy   Meds: No orders of the defined types were placed in this encounter.   Labs/procedures today: none  Treatment Plan:    Reviewed: Preterm labor symptoms and general obstetric precautions including but not limited to vaginal bleeding, contractions, leaking of fluid and fetal movement were reviewed in detail with the patient.  All questions were answered. Has home bp cuff. Check bp weekly, let us know if >140/90.   Follow-up: Return in about 3 weeks (around 11/21/2022) for Return OB w/GBS.  No orders of the defined types were placed in this encounter.  Raelyn Mora MSN, CNM 10/31/2022 11:36 AM

## 2022-11-05 ENCOUNTER — Encounter: Payer: Medicaid Other | Admitting: Physical Therapy

## 2022-11-05 ENCOUNTER — Encounter: Payer: Self-pay | Admitting: Physical Therapy

## 2022-11-05 DIAGNOSIS — O99891 Other specified diseases and conditions complicating pregnancy: Secondary | ICD-10-CM | POA: Diagnosis not present

## 2022-11-05 DIAGNOSIS — M5459 Other low back pain: Secondary | ICD-10-CM

## 2022-11-05 NOTE — Therapy (Signed)
OUTPATIENT PHYSICAL THERAPY TREATMENT NOTE   Patient Name: Heather Kelly MRN: [id number] DOB:Dec 06, 1981, 41 y.o., female Today's Date: 11/05/2022  PCP: Lupita Raider, MD  REFERRING PROVIDER:  Brock Bad, MD   END OF SESSION:   PT End of Session - 11/05/22 1304     Visit Number 5    Date for PT Re-Evaluation 12/10/22    Authorization Type [employer]    Authorization Time Period 10/17-12/16    Authorization - Visit Number 5    Authorization - Number of Visits 10    PT Start Time 1300    PT Stop Time 1345    PT Time Calculation (min) 45 min    Activity Tolerance Patient tolerated treatment well    Behavior During Therapy [facility] for tasks assessed/performed             Past Medical History:  Diagnosis Date   Hypothyroidism    Past Surgical History:  Procedure Laterality Date   BARIATRIC SURGERY  04/10/2021   CESAREAN SECTION  2011   CESAREAN SECTION  2007   Patient Active Problem List   Diagnosis Date Noted   Obesity affecting pregnancy, antepartum 08/08/2022   Alpha thalassemia silent carrier 06/20/2022   AMA (advanced maternal age) multigravida 35+ 06/05/2022   History of 2 cesarean deliveries, currently pregnant 06/05/2022   Hypothyroidism affecting pregnancy 06/05/2022   Supervision of high risk pregnancy, antepartum 05/17/2022   OSA on CPAP 01/23/2021   REFERRING DIAG: O99.891,M54.9 (ICD-10-CM) - Back pain affecting pregnancy in second trimester   THERAPY DIAG:  Other low back pain   Rationale for Evaluation and Treatment Rehabilitation   ONSET DATE: 09/20/2022   SUBJECTIVE:                                                                                                                                                                                            SUBJECTIVE STATEMENT: Today I am having pain in the right buttocks and more than last time. I have had more trouble with extending my leg. I have been doing the exercises. Pain is more muscle  than nerve.    PAIN:  Are you having pain? Yes NPRS scale: 6/10 right hip Pain location:  low back, right hip to the tailbone   Pain type: soreness Pain description: intermittent    Aggravating factors:standing, positions that support her weight, walking around, bending to pick up items Relieving factors: laying down   PRECAUTIONS: Other: pregnant   WEIGHT BEARING RESTRICTIONS No   FALLS:  Has patient fallen in last 6 months? No   LIVING ENVIRONMENT: Lives with: lives with their family  OCCUPATION: none   PLOF: Independent   PATIENT GOALS not be in pain   PERTINENT HISTORY:  Pregnant; Hypothyroidism; c-section x2; bariatric surgery 04/10/2021     PREGNANCY Vaginal deliveries 0 C-section deliveries 2 Currently pregnant Yes: due 12/18/22 ; children age is 7 and 74 year old      OBJECTIVE: (objective measures completed at initial evaluation unless otherwise dated)     (Copy Eval's Objective through Plan section here)       DIAGNOSTIC FINDINGS:  Herniated disc between L5-S1   PATIENT SURVEYS:  Modified Oswestry 33 points severe disability     COGNITION:            Overall cognitive status: Within functional limits for tasks assessed                          SENSATION:            Light touch: Appears intact            Proprioception: Appears intact       LUMBAR SPECIAL TESTS:  Straight leg raise test: Positive and Gaenslen's test: Positive on right for both, right pain goes to right trochanter                  POSTURE: rounded shoulders, forward head, decreased lumbar lordosis, weight shift left, and hips shifted left               PELVIC ALIGNMENT: right ilium is posteriorly rotated but after therapy it is equal   LUMBARAROM/PROM   A/PROM A/PROM  eval  Flexion Decreased by 75%  Extension Decreased by 75%  Right lateral flexion Decreased by 75%  Left lateral flexion Decreased by 75%  Right rotation Decreased by 75%  Left rotation Decreased by  75%   (Blank rows = not tested)      PALPATION:   General  tenderness located on the right quadratus, lumbar paraspinals, gluteals and right levator ani                   TODAY'S TREATMENT  11/05/2022 Manual: Soft tissue mobilization: using the addaday to the right gluteal and quadratus then trigger point release to the quadratus holding 90 seconds ; release of the right sacrotuberous ligament.  Neuromuscular re-education: Core retraining:Instructed patient on how to engage her abdomen in standing to reduce the lordosis and strain on her back. She was able to return demonstration.  Exercises: Stretches/mobility:sitting hamstring stretch 3x holding for 30 sec Strengthening: bridge 10x Sidely on left side with right hip extend with knee flexed and push into flat surface to engage the glutes    10/29/2022 Manual: Soft tissue mobilization: to right gluteal, around the posterior right hip, right ITB, right levator ani Exercises: Stretches/mobility:  quadruped with hips IR rocking back and forth Quadruped with right knee on yoga block moving anterior and posterior, side to side to stretch the right SI joint Strengthening:  Quadruped with right knee on yoga block and moving left hip in line with right Right foot on step with left hand reaching to right foot Standing in mini squat bilateral shoulder extension with red band Standing in mini squat shoulder row with red band Self-care: Educated patient on how to massage her back, gluteals, and levator ani with massage gun and which massage heads to use    10/22/2022 Manual: Soft tissue mobilization: using the Addaday to the right gluteus medius, piriformis, and SI joint  in sidey Spinal mobilization:Muscle energy to correct right ilium posterior rotation Gapping of the right side of L1-L5 and T3-T5 in left sidely Mobilize the left SI joint with a towel along the SI joint and therapist gliding the right hip  posteriorly Exercises: Strengthening: supine clam with red band 10x Bridge with red band around the knees Sitting knee flexion red band 15 x 2   Self-care: Educated patient on what props are at the [facility] hospital to assist with positioning at birth.     10/10/2022 Manual: Soft tissue mobilization:Manual work to the right gluteal in left sidely to reduce pain Educated patient on how to use a ball to massage the right gluteal in sitting  Exercises: Stretches/mobility:sidely piriformis hold 30 sec on the right side 2x Single knee to chest  on the right holding for 30 sec 2x On left side open book stretch 15x Strengthening: Sitting with yoga block between knees and moving knees back and forth to open up the SI joint Lateral shift on the wall        PATIENT EDUCATION:  11/05/2022 Education details: [email or web address];  Person educated: Patient Education method: Explanation, Demonstration, Tactile cues, Verbal cues, and Handouts Education comprehension: verbalized understanding, returned demonstration, verbal cues required, tactile cues required, and needs further education     HOME EXERCISE PROGRAM: 11/05/2022/2023  Access Code: [email or web address] URL: [email or web address] Date: 10/29/2022 Prepared by: Eulis Foster   Program Notes sit with ball between knees and shift hips forward adn back 10xsit on a hard ball  and massage into the buttocks   Exercises   - Quadruped Yoga Block Lift Off  - 1 x daily - 7 x weekly - 3 sets - 10 reps - Squatting Shoulder Row with Anchored Resistance  - 1 x daily - 7 x weekly - 3 sets - 10 reps - Seated Hamstring Stretch  - 1 x daily - 7 x weekly - 1 sets - 2 reps - 30 sec hold     ASSESSMENT:   CLINICAL IMPRESSION: Patient is a 41 y.o. female who was seen today for physical therapy treatment for low back pain . Patient has more mobility in her back. She had trigger points in the right quadratus. Patient pain decreased to 2/10 after manual work  and she was able to fully flex her right hip in standng without pain. She had tightness in the right sacrotuberous ligament. Patient has trouble engaging her right gluteal causing difficulty with SI stabilization.    Patient will benefit from skilled therapy to reduce her back pain and improve her mobility during her pregnancy to improve her function.      OBJECTIVE IMPAIRMENTS decreased activity tolerance, decreased coordination, decreased mobility, decreased ROM, increased muscle spasms, and pain.    ACTIVITY LIMITATIONS carrying, lifting, bending, sitting, standing, squatting, sleeping, stairs, transfers, bed mobility, bathing, and dressing   PARTICIPATION LIMITATIONS: meal prep, cleaning, laundry, driving, shopping, and community activity   PERSONAL FACTORS Age, Time since onset of injury/illness/exacerbation, and 1-2 comorbidities: Pregnant; Hypothyroidism; c-section x2; bariatric surgery 04/10/2021  are also affecting patient's functional outcome.    REHAB POTENTIAL: Excellent   CLINICAL DECISION MAKING: Evolving/moderate complexity   EVALUATION COMPLEXITY: Moderate     GOALS: Goals reviewed with patient? Yes   SHORT TERM GOALS: Target date: 10/29/2022   Patient independent with lumbar exercise program to move her disc off the nerve and centralize her back pain.  Baseline:Patient pain is in the right buttocks Goal status: ongoing 10/22/2022  LONG TERM GOALS: Target date: 12/10/2022    Patient independent with advanced HEP for back and SI stabilization exercises.  Baseline: Not educated yet Goal status: ongoing  10/22/2022   2.  Patient reports her back pain decreased to 2/10 or less due to centralizing her back pain.  Baseline: pain level 6/10 and goies into the right buttocks Goal status: INITIAL   3.  Patient educated on correct body mechanics to reduce the strain on her back during her pregnancy and reduce her pain.  Baseline: not educated yet.  Goal status: INITIAL    4.  Modified Oswestry score decreased </= 4 so she is able to perform her daily activities with greater ease.  Baseline: score is 33 Goal status: INITIAL   5.  Patient educated on ways to give birth to decrease strain on her back.  Baseline: Not educated yet Goal status: INITIAL     PLAN: PT FREQUENCY: 1x/week   PT DURATION: 10 weeks   PLANNED INTERVENTIONS: Therapeutic exercises, Therapeutic activity, Neuromuscular re-education, Patient/Family education, Self Care, Joint mobilization, Spinal mobilization, Cryotherapy, Moist heat, and Manual therapy   PLAN FOR NEXT SESSION: body mechanics with daily activities, birthing positions to reduce strain on back, possible discharge.    Eulis Fosterheryl Treyshon Buchanon, PT 11/05/22 2:00 PM

## 2022-11-06 ENCOUNTER — Ambulatory Visit: Payer: Medicaid Other | Admitting: *Deleted

## 2022-11-06 ENCOUNTER — Ambulatory Visit: Payer: Medicaid Other | Attending: Student

## 2022-11-06 ENCOUNTER — Encounter: Payer: Self-pay | Admitting: *Deleted

## 2022-11-06 ENCOUNTER — Other Ambulatory Visit: Payer: Self-pay | Admitting: *Deleted

## 2022-11-06 VITALS — BP 106/59 | HR 70

## 2022-11-06 DIAGNOSIS — O99210 Obesity complicating pregnancy, unspecified trimester: Secondary | ICD-10-CM | POA: Diagnosis not present

## 2022-11-06 DIAGNOSIS — O0990 Supervision of high risk pregnancy, unspecified, unspecified trimester: Secondary | ICD-10-CM | POA: Diagnosis present

## 2022-11-06 DIAGNOSIS — O99283 Endocrine, nutritional and metabolic diseases complicating pregnancy, third trimester: Secondary | ICD-10-CM | POA: Insufficient documentation

## 2022-11-06 DIAGNOSIS — D563 Thalassemia minor: Secondary | ICD-10-CM

## 2022-11-06 DIAGNOSIS — O09529 Supervision of elderly multigravida, unspecified trimester: Secondary | ICD-10-CM | POA: Diagnosis not present

## 2022-11-06 DIAGNOSIS — O34219 Maternal care for unspecified type scar from previous cesarean delivery: Secondary | ICD-10-CM

## 2022-11-06 DIAGNOSIS — O09523 Supervision of elderly multigravida, third trimester: Secondary | ICD-10-CM

## 2022-11-06 DIAGNOSIS — E039 Hypothyroidism, unspecified: Secondary | ICD-10-CM

## 2022-11-12 ENCOUNTER — Encounter: Payer: Self-pay | Admitting: Physical Therapy

## 2022-11-12 ENCOUNTER — Encounter: Payer: Medicaid Other | Admitting: Physical Therapy

## 2022-11-12 DIAGNOSIS — O99891 Other specified diseases and conditions complicating pregnancy: Secondary | ICD-10-CM | POA: Diagnosis not present

## 2022-11-12 DIAGNOSIS — M5459 Other low back pain: Secondary | ICD-10-CM

## 2022-11-12 NOTE — Therapy (Signed)
OUTPATIENT PHYSICAL THERAPY TREATMENT NOTE   Patient Name: Heather Kelly MRN: [id number] DOB:September 26, 1981, 41 y.o., female Today's Date: 11/12/2022  PCP: Mayra Neer, MD  REFERRING PROVIDER: Shelly Bombard, MD   END OF SESSION:   PT End of Session - 11/12/22 1313     Visit Number 6    Date for PT Re-Evaluation 12/10/22    Authorization Type [employer]    Authorization Time Period 10/17-12/16    Authorization - Visit Number 6    Authorization - Number of Visits 10    PT Start Time 0488    PT Stop Time 1350    PT Time Calculation (min) 42 min    Activity Tolerance Patient tolerated treatment well    Behavior During Therapy [facility] for tasks assessed/performed             Past Medical History:  Diagnosis Date   Hypothyroidism    Past Surgical History:  Procedure Laterality Date   BARIATRIC SURGERY  04/10/2021   CESAREAN SECTION  2011   CESAREAN SECTION  2007   Patient Active Problem List   Diagnosis Date Noted   Obesity affecting pregnancy, antepartum 08/08/2022   Alpha thalassemia silent carrier 06/20/2022   AMA (advanced maternal age) multigravida 35+ 06/05/2022   History of 2 cesarean deliveries, currently pregnant 06/05/2022   Hypothyroidism affecting pregnancy 06/05/2022   Supervision of high risk pregnancy, antepartum 05/17/2022   OSA on CPAP 01/23/2021   REFERRING DIAG: O99.891,M54.9 (ICD-10-CM) - Back pain affecting pregnancy in second trimester   THERAPY DIAG:  Other low back pain   Rationale for Evaluation and Treatment Rehabilitation   ONSET DATE: 09/20/2022   SUBJECTIVE:                                                                                                                                                                                            SUBJECTIVE STATEMENT: My back is good. The right hip nerve in the right buttocks. No pain in the right hamstring.    PAIN:  Are you having pain? Yes NPRS scale: 6/10 right buttocks Pain  location:  low back, right hip to the tailbone   Pain type: soreness Pain description: intermittent    Aggravating factors:getting up from a laying down or sitting position, waking up from sleepng.  Relieving factors: exercises   PRECAUTIONS: Other: pregnant   WEIGHT BEARING RESTRICTIONS No   FALLS:  Has patient fallen in last 6 months? No   LIVING ENVIRONMENT: Lives with: lives with their family   OCCUPATION: none   PLOF: Independent   PATIENT GOALS not be in pain  PERTINENT HISTORY:  Pregnant; Hypothyroidism; c-section x2; bariatric surgery 04/10/2021     PREGNANCY Vaginal deliveries 0 C-section deliveries 2 Currently pregnant Yes: due 12/18/22 ; children age is 67 and 49 year old      OBJECTIVE: (objective measures completed at initial evaluation unless otherwise dated)     (Copy Eval's Objective through Plan section here)       DIAGNOSTIC FINDINGS:  Herniated disc between L5-S1   PATIENT SURVEYS:  Modified Oswestry 33 points severe disability     COGNITION:            Overall cognitive status: Within functional limits for tasks assessed                          SENSATION:            Light touch: Appears intact            Proprioception: Appears intact       LUMBAR SPECIAL TESTS:  Straight leg raise test: Positive and Gaenslen's test: Positive on right for both, right pain goes to right trochanter                  POSTURE: rounded shoulders, forward head, decreased lumbar lordosis, weight shift left, and hips shifted left               PELVIC ALIGNMENT: right ilium is posteriorly rotated but after therapy it is equal   LUMBARAROM/PROM   A/PROM A/PROM  eval  Flexion Decreased by 25%  Extension Decreased by 25%  Right lateral flexion full  Left lateral flexion full  Right rotation full  Left rotation full   (Blank rows = not tested)      PALPATION:   General  tenderness in right gluteal                  TODAY'S TREATMENT   11/12/2022 Manual: Soft tissue mobilization: education on perineal massage prior to delivery to reduce tearing Neuromuscular re-education: Core retraining:transverse abdominus in sitting to engage them and assist with the contraction Core facilitation: diaphragmatic breathing in sitting to expand the rib cage after the baby is born Form correction: Pelvic floor contraction training: Down training: laying with legs elevated to reduce the swelling in the perineal area and work on pelvic floor relaxation and contraction, educated patient on correct breathing to push the baby out instead of tightening the pelvic floor. educated patient on how to make perineal pads for after birth to reduce swelling, how to elevate pelvis to reduce swelling and using compression garments to support the perineal area and organs    11/05/2022 Manual: Soft tissue mobilization: using the addaday to the right gluteal and quadratus then trigger point release to the quadratus holding 90 seconds ; release of the right sacrotuberous ligament.  Neuromuscular re-education: Core retraining:Instructed patient on how to engage her abdomen in standing to reduce the lordosis and strain on her back. She was able to return demonstration.  Exercises: Stretches/mobility:sitting hamstring stretch 3x holding for 30 sec Strengthening: bridge 10x Sidely on left side with right hip extend with knee flexed and push into flat surface to engage the glutes    10/29/2022 Manual: Soft tissue mobilization: to right gluteal, around the posterior right hip, right ITB, right levator ani Exercises: Stretches/mobility:  quadruped with hips IR rocking back and forth Quadruped with right knee on yoga block moving anterior and posterior, side to side to stretch the right  SI joint Strengthening:  Quadruped with right knee on yoga block and moving left hip in line with right Right foot on step with left hand reaching to right foot Standing in mini  squat bilateral shoulder extension with red band Standing in mini squat shoulder row with red band Self-care: Educated patient on how to massage her back, gluteals, and levator ani with massage gun and which massage heads to use     PATIENT EDUCATION:  11/12/2022 Education details: [id number]; perineal massage, ways to breath with pushing, ways to reduce swelling in the perineal area Person educated: Patient Education method: Explanation, Demonstration, Tactile cues, Verbal cues, and Handouts Education comprehension: verbalized understanding, returned demonstration, verbal cues required, tactile cues required, and needs further education     HOME EXERCISE PROGRAM: 11/12/2022  Access Code: [email or web address] URL: [email or web address] Date: 11/12/2022 Prepared by: Earlie Counts  Program Notes sit with ball between knees and shift hips forward adn back 10xsit on a hard ball  and massage into the buttockscoconut oil to the perineal area daily to reduce vaginal drynessperineal massage with coconut oil see handout  Exercises - Lateral Shift Correction at [place]  - 1 x daily - 7 x weekly - 1 sets - 5 reps - Sidelying Thoracic Rotation with Open Book  - 1 x daily - 7 x weekly - 1 sets - 10 reps - Sidelying Single Knee to Chest Stretch  - 1 x daily - 7 x weekly - 1 sets - 2 reps - 30 sec hold - Supine Figure 4 Piriformis Stretch  - 1 x daily - 7 x weekly - 1 sets - 2 reps - 30 sec hold - Hooklying Clamshell with Resistance  - 1 x daily - 3 x weekly - 1 sets - 10 reps - Supine Bridge with Resistance Band  - 1 x daily - 3 x weekly - 1 sets - 10 reps - Seated Hamstring Curl with Anchored Resistance  - 1 x daily - 3 x weekly - 2 sets - 10 reps - Quadruped Yoga Block Lift Off  - 1 x daily - 7 x weekly - 3 sets - 10 reps - Squatting Shoulder Row with Anchored Resistance  - 1 x daily - 7 x weekly - 3 sets - 10 reps - Seated Hamstring Stretch  - 1 x daily - 7 x weekly - 1 sets - 2 reps - 30 sec  hold - Supine Diaphragmatic Breathing  - 1 x daily - 7 x weekly - 3 sets - 10 reps - Seated Diaphragmatic Breathing  - 1 x daily - 7 x weekly - 3 sets - 10 reps - Seated Transversus Abdominis Bracing  - 1 x daily - 7 x weekly - 1 sets - 10 reps - Diaphragmatic Breathing with Hips Elevated (for Pelvic Organ Prolapse)  - 3 x daily - 7 x weekly - 1 reps - 5-10 min hold - Seated Pelvic Floor Contraction  - 3 x daily - 7 x weekly - 1 sets - 5 reps - 5 sec hold - Seated Quick Flick Pelvic Floor Contractions  - 3 x daily - 7 x weekly - 1 sets - 5 reps  Patient Education - Low Back Pain Handout - Managing Back Pain   ASSESSMENT:   CLINICAL IMPRESSION: Patient is a 41 y.o. female who was seen today for physical therapy treatment for low back pain .She has improved ROM of lumbar. She only has pain in right buttocks at 6/10. She understands how to  breath correctly with birth. She understands exercises she can do after birth and ways to reduce swelling in the perineal area. She understands how to perform perineal massage to reduce tearing. She is independent with her HEP.     OBJECTIVE IMPAIRMENTS decreased activity tolerance, decreased coordination, decreased mobility, decreased ROM, increased muscle spasms, and pain.    ACTIVITY LIMITATIONS carrying, lifting, bending, sitting, standing, squatting, sleeping, stairs, transfers, bed mobility, bathing, and dressing   PARTICIPATION LIMITATIONS: meal prep, cleaning, laundry, driving, shopping, and community activity   PERSONAL FACTORS Age, Time since onset of injury/illness/exacerbation, and 1-2 comorbidities: Pregnant; Hypothyroidism; c-section x2; bariatric surgery 04/10/2021  are also affecting patient's functional outcome.    REHAB POTENTIAL: Excellent   CLINICAL DECISION MAKING: Evolving/moderate complexity   EVALUATION COMPLEXITY: Moderate     GOALS: Goals reviewed with patient? Yes   SHORT TERM GOALS: Target date: 10/29/2022   Patient  independent with lumbar exercise program to move her disc off the nerve and centralize her back pain.  Baseline:Patient pain is in the right buttocks Goal status: Met 11/12/2022     LONG TERM GOALS: Target date: 12/10/2022    Patient independent with advanced HEP for back and SI stabilization exercises.  Baseline: Not educated yet Goal status: Met 11/12/2022   2.  Patient reports her back pain decreased to 2/10 or less due to centralizing her back pain.  Baseline: pain level 6/10 and goies into the right buttocks Goal status: Met 11/12/2022   3.  Patient educated on correct body mechanics to reduce the strain on her back during her pregnancy and reduce her pain.  Baseline: not educated yet.  Goal status: Met 11/12/2022   4.  Modified Oswestry score decreased </= 4 so she is able to perform her daily activities with greater ease.  Baseline: score is 33 Goal status: Not assessed    5.  Patient educated on ways to give birth to decrease strain on her back.  Baseline: Not educated yet Goal status: Met 11/12/2022     PLAN: PLAN discharge to [place], PT 11/12/22 2:09 PM  PHYSICAL THERAPY DISCHARGE SUMMARY  Visits from Start of Care: 6  Current functional level related to goals / functional outcomes: See above.    Remaining deficits: See above.    Education / Equipment: HEP   Patient agrees to discharge. Patient goals were met. Patient is being discharged due to meeting the stated rehab goals. Thank you for the referral. Earlie Counts, PT 11/12/22 2:09 PM

## 2022-11-12 NOTE — Patient Instructions (Signed)
Padsicles - Postpartum Pads for Mom PADSICLES How to Make Padsicles (postpartum frozen pads) to help soothe and heal after after delivering a baby. Padsicles are the best postpartum pads because they are covered in aloe vera, witch hazel and lavender oil.  Prep Time5 minutes Active Time15 minutes Total Time20 minutes Makes20 padsicles  EQUIPMENT teaspoon  SUPPLIES 20 Extra Heavy Overnight Maxi Pads Aloe Vera Designer, television/film set  INSTRUCTIONS Partially unwrap a few pads at a time, but don't detach the wrapper. Spread aloe vera generously up and down the whole pad. Don't just do the middle part - spread it further down almost to the bottom of the pad. Pour about a teaspoon of witch hazel down the middle.  Add a few drops of lavender oil, for healing purposes.  Fold the pads back up to how they were and stick them in a gallon sized plastic bag, then freeze. Pull them out of the freezer, one by one, as needed and let them thaw for two or three minutes before use.  Peri anal bottle to use after going to the bathroom Use your toileting techniques and squatty potty Coconut oil for the nipples, or Earth mama nipple butter and nipple shield Compression underwear or workout pants  Aloe and Alcohol free witch hazel   Eulis Foster, PT [facility] [address], [address] [place], [place] [address] W: [phone] [email or web address]

## 2022-11-14 ENCOUNTER — Encounter: Payer: Self-pay | Admitting: Certified Nurse Midwife

## 2022-11-14 ENCOUNTER — Ambulatory Visit (INDEPENDENT_AMBULATORY_CARE_PROVIDER_SITE_OTHER): Payer: Medicaid Other | Admitting: Certified Nurse Midwife

## 2022-11-14 VITALS — BP 123/69 | HR 76 | Wt 247.8 lb

## 2022-11-14 DIAGNOSIS — O99213 Obesity complicating pregnancy, third trimester: Secondary | ICD-10-CM

## 2022-11-14 DIAGNOSIS — D563 Thalassemia minor: Secondary | ICD-10-CM

## 2022-11-14 DIAGNOSIS — O99210 Obesity complicating pregnancy, unspecified trimester: Secondary | ICD-10-CM

## 2022-11-14 DIAGNOSIS — O0993 Supervision of high risk pregnancy, unspecified, third trimester: Secondary | ICD-10-CM

## 2022-11-14 DIAGNOSIS — O34219 Maternal care for unspecified type scar from previous cesarean delivery: Secondary | ICD-10-CM

## 2022-11-14 DIAGNOSIS — Z3A35 35 weeks gestation of pregnancy: Secondary | ICD-10-CM

## 2022-11-14 NOTE — Progress Notes (Signed)
   PRENATAL VISIT NOTE  Subjective:  Heather Kelly is a 41 y.o. [id number] at [redacted]w[redacted]d being seen today for ongoing prenatal care.  She is currently monitored for the following issues for this high-risk pregnancy and has Supervision of high risk pregnancy, antepartum; AMA (advanced maternal age) multigravida 35+; History of 2 cesarean deliveries, currently pregnant; Hypothyroidism affecting pregnancy; Alpha thalassemia silent carrier; Obesity affecting pregnancy, antepartum; and OSA on CPAP on their problem list.  Patient reports no complaints.  Contractions: Not present. Vag. Bleeding: None.  Movement: Present. Denies leaking of fluid.   The following portions of the patient's history were reviewed and updated as appropriate: allergies, current medications, past family history, past medical history, past social history, past surgical history and problem list.   Objective:   Vitals:   11/14/22 1132  BP: 123/69  Pulse: 76  Weight: 247 lb 12.8 oz (112.4 kg)    Fetal Status: Fetal Heart Rate (bpm): 141 Fundal Height: 36 cm Movement: Present     General:  Alert, oriented and cooperative. Patient is in no acute distress.  Skin: Skin is warm and dry. No rash noted.   Cardiovascular: Normal heart rate noted  Respiratory: Normal respiratory effort, no problems with respiration noted  Abdomen: Soft, gravid, appropriate for gestational age.  Pain/Pressure: Present     Pelvic: Cervical exam deferred        Extremities: Normal range of motion.  Edema: None  Mental Status: Normal mood and affect. Normal behavior. Normal judgment and thought content.   Assessment and Plan:  Pregnancy: [id number] at [redacted]w[redacted]d 1. Supervision of high risk pregnancy in third trimester - Patient feeling frequent and vigorous fetal movement.  - FOB present for visit and supportive   2. [redacted] weeks gestation of pregnancy - Anticipatory guidance provided on GBS collection for next visit.  - Patient desiring Jan baby.  -Reviewed  labor readiness with patient including the [facility] [employer], evening primrose oil, and raspberry leaf tea to begin at 37 weeks.   - CNM strongly Cautioned against use of Midwife's Brew, especially with previous c/s history. Patient verbalized understanding.   3. History of cesarean delivery, currently pregnant - Patient planning VBAC. Has doula and support system in place.   4. Obesity affecting pregnancy, antepartum, unspecified obesity type   5. Alpha thalassemia silent carrier - Declined partner testing .  Preterm labor symptoms and general obstetric precautions including but not limited to vaginal bleeding, contractions, leaking of fluid and fetal movement were reviewed in detail with the patient. Please refer to After Visit Summary for other counseling recommendations.   Return in about 1 week (around 11/21/2022) for LOB w GBS.  Future Appointments  Date Time Provider Department Center  11/21/2022 11:15 AM Constant, Gigi Gin, MD [facility] None  11/28/2022 10:55 AM Raelyn Mora, CNM [facility] None  12/04/2022 10:35 AM Corlis Hove, NP [facility] None  12/04/2022  2:30 PM [facility] NURSE [facility]  12/04/2022  2:45 PM [facility] US6 [facility]  12/13/2022  9:55 AM Lennart Pall, MD [facility] None    Hubert Raatz Danella Deis) Suzie Portela, MSN, CNM  Center for [facility]  11/14/22 2:48 PM

## 2022-11-14 NOTE — Progress Notes (Signed)
Patient presents for ROB visit.

## 2022-11-19 ENCOUNTER — Encounter: Payer: Medicaid Other | Admitting: Physical Therapy

## 2022-11-21 ENCOUNTER — Encounter: Payer: Self-pay | Admitting: Obstetrics and Gynecology

## 2022-11-21 ENCOUNTER — Ambulatory Visit (INDEPENDENT_AMBULATORY_CARE_PROVIDER_SITE_OTHER): Payer: Medicaid Other | Admitting: Obstetrics and Gynecology

## 2022-11-21 ENCOUNTER — Other Ambulatory Visit (HOSPITAL_COMMUNITY)
Admission: RE | Admit: 2022-11-21 | Discharge: 2022-11-21 | Disposition: A | Discharge status: Home / Self Care | Payer: Medicaid Other | Source: Ambulatory Visit | Attending: Obstetrics and Gynecology | Admitting: Obstetrics and Gynecology

## 2022-11-21 VITALS — BP 108/73 | HR 79 | Wt 253.0 lb

## 2022-11-21 DIAGNOSIS — O99283 Endocrine, nutritional and metabolic diseases complicating pregnancy, third trimester: Secondary | ICD-10-CM

## 2022-11-21 DIAGNOSIS — O99820 Streptococcus B carrier state complicating pregnancy: Secondary | ICD-10-CM

## 2022-11-21 DIAGNOSIS — O0990 Supervision of high risk pregnancy, unspecified, unspecified trimester: Secondary | ICD-10-CM | POA: Diagnosis present

## 2022-11-21 DIAGNOSIS — O34219 Maternal care for unspecified type scar from previous cesarean delivery: Secondary | ICD-10-CM

## 2022-11-21 DIAGNOSIS — O09523 Supervision of elderly multigravida, third trimester: Secondary | ICD-10-CM

## 2022-11-21 DIAGNOSIS — E039 Hypothyroidism, unspecified: Secondary | ICD-10-CM

## 2022-11-21 DIAGNOSIS — O0993 Supervision of high risk pregnancy, unspecified, third trimester: Secondary | ICD-10-CM

## 2022-11-21 DIAGNOSIS — Z3A36 36 weeks gestation of pregnancy: Secondary | ICD-10-CM

## 2022-11-21 DIAGNOSIS — O99213 Obesity complicating pregnancy, third trimester: Secondary | ICD-10-CM

## 2022-11-21 DIAGNOSIS — O99210 Obesity complicating pregnancy, unspecified trimester: Secondary | ICD-10-CM

## 2022-11-21 NOTE — Progress Notes (Signed)
   PRENATAL VISIT NOTE  Subjective:  Heather Kelly is a 41 y.o. [id number] at [redacted]w[redacted]d being seen today for ongoing prenatal care.  She is currently monitored for the following issues for this high-risk pregnancy and has Supervision of high risk pregnancy, antepartum; AMA (advanced maternal age) multigravida 35+; History of 2 cesarean deliveries, currently pregnant; Hypothyroidism affecting pregnancy; Alpha thalassemia silent carrier; Obesity affecting pregnancy, antepartum; and OSA on CPAP on their problem list.  Patient reports no complaints.  Contractions: Not present. Vag. Bleeding: None.  Movement: Present. Denies leaking of fluid.   The following portions of the patient's history were reviewed and updated as appropriate: allergies, current medications, past family history, past medical history, past social history, past surgical history and problem list.   Objective:   Vitals:   11/21/22 1121  BP: 108/73  Pulse: 79  Weight: 253 lb (114.8 kg)    Fetal Status: Fetal Heart Rate (bpm): 140 Fundal Height: 37 cm Movement: Present     General:  Alert, oriented and cooperative. Patient is in no acute distress.  Skin: Skin is warm and dry. No rash noted.   Cardiovascular: Normal heart rate noted  Respiratory: Normal respiratory effort, no problems with respiration noted  Abdomen: Soft, gravid, appropriate for gestational age.  Pain/Pressure: Present     Pelvic: Cervical exam performed in the presence of a chaperone Dilation: Closed Effacement (%): Thick Station: Ballotable  Extremities: Normal range of motion.     Mental Status: Normal mood and affect. Normal behavior. Normal judgment and thought content.   Assessment and Plan:  Pregnancy: [id number] at [redacted]w[redacted]d 1. Supervision of high risk pregnancy, antepartum Patient is doing well without complaints Cultures collected  2. History of 2 cesarean deliveries, currently pregnant Patient with previous 2 c-sections and desires TOLAC Discussed risks  associated with TOLAC following 2 previous c-sections. Discussed c-section if no delivery at 39 weeks. Patient is not interested in discussing c-section or having it scheduled at this time Informed patient that TOLAC would be possible if spontaneous onset of labor as out group do not plan for IOL after 2 c-sections  3. Obesity affecting pregnancy, antepartum, unspecified obesity type Continue ASA Follow up growth ultrasound on 12/20  4. Hypothyroidism affecting pregnancy in third trimester Continue synthroid  5. Multigravida of advanced maternal age in third trimester   Preterm labor symptoms and general obstetric precautions including but not limited to vaginal bleeding, contractions, leaking of fluid and fetal movement were reviewed in detail with the patient. Please refer to After Visit Summary for other counseling recommendations.   Return in about 1 week (around 11/28/2022) for in person, ROB, High risk.  Future Appointments  Date Time Provider Department Center  11/28/2022 10:55 AM Raelyn Mora, CNM [facility] None  12/04/2022 10:35 AM Corlis Hove, NP [facility] None  12/04/2022  2:30 PM [facility] NURSE [facility]  12/04/2022  2:45 PM [facility] US6 [facility]  12/13/2022  9:55 AM Lennart Pall, MD [facility] None    Catalina Antigua, MD

## 2022-11-21 NOTE — Addendum Note (Signed)
Addended by: Marya Landry D on: 11/21/2022 02:23 PM   Modules accepted: Orders

## 2022-11-22 LAB — CERVICOVAGINAL ANCILLARY ONLY
Chlamydia: NEGATIVE
Comment: NEGATIVE
Comment: NORMAL
Neisseria Gonorrhea: NEGATIVE

## 2022-11-27 DIAGNOSIS — O99820 Streptococcus B carrier state complicating pregnancy: Secondary | ICD-10-CM | POA: Insufficient documentation

## 2022-11-27 LAB — CULTURE, GROUP A STREP

## 2022-11-27 LAB — SPECIMEN STATUS REPORT

## 2022-11-28 ENCOUNTER — Encounter: Payer: Self-pay | Admitting: Obstetrics and Gynecology

## 2022-11-28 ENCOUNTER — Ambulatory Visit (INDEPENDENT_AMBULATORY_CARE_PROVIDER_SITE_OTHER): Payer: Medicaid Other | Admitting: Obstetrics and Gynecology

## 2022-11-28 VITALS — BP 130/70 | HR 80 | Wt 254.4 lb

## 2022-11-28 DIAGNOSIS — O34219 Maternal care for unspecified type scar from previous cesarean delivery: Secondary | ICD-10-CM

## 2022-11-28 DIAGNOSIS — O99210 Obesity complicating pregnancy, unspecified trimester: Secondary | ICD-10-CM

## 2022-11-28 DIAGNOSIS — O0993 Supervision of high risk pregnancy, unspecified, third trimester: Secondary | ICD-10-CM

## 2022-11-28 DIAGNOSIS — O99213 Obesity complicating pregnancy, third trimester: Secondary | ICD-10-CM

## 2022-11-28 DIAGNOSIS — O99283 Endocrine, nutritional and metabolic diseases complicating pregnancy, third trimester: Secondary | ICD-10-CM

## 2022-11-28 DIAGNOSIS — Z6791 Unspecified blood type, Rh negative: Secondary | ICD-10-CM

## 2022-11-28 DIAGNOSIS — Z3A37 37 weeks gestation of pregnancy: Secondary | ICD-10-CM

## 2022-11-28 DIAGNOSIS — O0990 Supervision of high risk pregnancy, unspecified, unspecified trimester: Secondary | ICD-10-CM

## 2022-11-28 DIAGNOSIS — O26893 Other specified pregnancy related conditions, third trimester: Secondary | ICD-10-CM

## 2022-11-28 DIAGNOSIS — E039 Hypothyroidism, unspecified: Secondary | ICD-10-CM

## 2022-11-28 NOTE — Progress Notes (Signed)
[place] PREGNANCY OFFICE VISIT Patient name: Heather Kelly MRN [id number]  Date of birth: 1981-07-21 Chief Complaint:   Routine Prenatal Visit  History of Present Illness:   Heather Kelly is a 41 y.o. [phone] female at [redacted]w[redacted]d with an Estimated Date of Delivery: 12/18/22 being seen today for ongoing management of a high-risk pregnancy complicated by  prior cesarean delivery x 2 and obesity Today she reports  she didn't feel comfortable seeing the last provider she saw in the office. She would prefer to see only the providers she has been seeing in the office.   . Contractions: Irregular. Vag. Bleeding: None.  Movement: Present. denies leaking of fluid.  Review of Systems:   Pertinent items are noted in HPI Denies abnormal vaginal discharge w/ itching/odor/irritation, headaches, visual changes, shortness of breath, chest pain, abdominal pain, severe nausea/vomiting, or problems with urination or bowel movements unless otherwise stated above. Pertinent History Reviewed:  Reviewed past medical,surgical, social, obstetrical and family history.  Reviewed problem list, medications and allergies. Physical Assessment:   Vitals:   11/28/22 1106  BP: 130/70  Pulse: 80  Weight: 254 lb 6.4 oz (115.4 kg)  Body mass index is 37.57 kg/m.           Physical Examination:   General appearance: alert, well appearing, and in no distress, oriented to person, place, and time, and overweight  Mental status: alert, oriented to person, place, and time, normal mood, behavior, speech, dress, motor activity, and thought processes  Skin: warm & dry   Extremities: Edema: None    Cardiovascular: normal heart rate noted  Respiratory: normal respiratory effort, no distress  Abdomen: gravid, soft, non-tender  Pelvic: Cervical exam performed  Dilation: Closed Effacement (%): 50 Station: Ballotable  Fetal Status:   Fundal Height: 38 cm Movement: Present Presentation: Vertex  Fetal Surveillance Testing today: none   No  results found for this or any previous visit (from the past 24 hour(s)).  Assessment & Plan:  1) High-risk pregnancy [id number] at [redacted]w[redacted]d with an Estimated Date of Delivery: 12/18/22   2) Supervision of high risk pregnancy, antepartum - Recommend not being induced at 39 weeks, but to schedule in the 40th week when cervix maybe more favorable - Culture, beta strep (group b only)>> repeated d/t error at [facility] (results were entered as Group A)>> Results states: Beta-hemolytic colonies, not group A Streptococcus isolated.   3) History of 2 cesarean deliveries, currently pregnant - Planning TOLAC - Discussed IOL process of a TOLAC patient: Foley balloon, pitocin and AROM. Excluding Cytotec, because of the risks of uterine rupture - Discussed uterine rupture being a <1% chance in the setting of no other complications or risk factors  3. Rh negative state in antepartum period, third trimester - Rhophylac received at 28 wks  4. Hypothyroidism affecting pregnancy in third trimester  5. Obesity affecting pregnancy, antepartum, unspecified obesity type - Taking bASA daily  6. [redacted] weeks gestation of pregnancy   Meds: No orders of the defined types were placed in this encounter.   Labs/procedures today: GBS and cervical exam  Treatment Plan:  expectant management for TOLAC  Reviewed: Term labor symptoms and general obstetric precautions including but not limited to vaginal bleeding, contractions, leaking of fluid and fetal movement were reviewed in detail with the patient.  All questions were answered. Has home bp cuff. Check bp weekly, let us know if >140/90.   Follow-up: Return in about 1 week (around 12/05/2022) for Return OB visit.  Orders Placed  This Encounter  Procedures   Culture, beta strep (group b only)   Raelyn Mora MSN, CNM 11/28/2022 11:57 AM

## 2022-12-01 ENCOUNTER — Encounter: Payer: Self-pay | Admitting: Obstetrics and Gynecology

## 2022-12-01 LAB — CULTURE, BETA STREP (GROUP B ONLY): Strep Gp B Culture: POSITIVE — AB

## 2022-12-03 ENCOUNTER — Encounter: Payer: Medicaid Other | Admitting: Physical Therapy

## 2022-12-04 ENCOUNTER — Ambulatory Visit: Payer: Medicaid Other | Attending: Maternal & Fetal Medicine

## 2022-12-04 ENCOUNTER — Encounter: Payer: Self-pay | Admitting: Student

## 2022-12-04 ENCOUNTER — Ambulatory Visit: Payer: Medicaid Other | Admitting: *Deleted

## 2022-12-04 ENCOUNTER — Ambulatory Visit (INDEPENDENT_AMBULATORY_CARE_PROVIDER_SITE_OTHER): Payer: Medicaid Other | Admitting: Student

## 2022-12-04 VITALS — BP 126/54 | HR 73

## 2022-12-04 VITALS — BP 118/62 | HR 72 | Wt 258.8 lb

## 2022-12-04 DIAGNOSIS — O99210 Obesity complicating pregnancy, unspecified trimester: Secondary | ICD-10-CM | POA: Diagnosis present

## 2022-12-04 DIAGNOSIS — O99213 Obesity complicating pregnancy, third trimester: Secondary | ICD-10-CM | POA: Diagnosis not present

## 2022-12-04 DIAGNOSIS — O34219 Maternal care for unspecified type scar from previous cesarean delivery: Secondary | ICD-10-CM

## 2022-12-04 DIAGNOSIS — D563 Thalassemia minor: Secondary | ICD-10-CM

## 2022-12-04 DIAGNOSIS — Z148 Genetic carrier of other disease: Secondary | ICD-10-CM

## 2022-12-04 DIAGNOSIS — O99283 Endocrine, nutritional and metabolic diseases complicating pregnancy, third trimester: Secondary | ICD-10-CM

## 2022-12-04 DIAGNOSIS — O99820 Streptococcus B carrier state complicating pregnancy: Secondary | ICD-10-CM

## 2022-12-04 DIAGNOSIS — Z3A38 38 weeks gestation of pregnancy: Secondary | ICD-10-CM

## 2022-12-04 DIAGNOSIS — O0990 Supervision of high risk pregnancy, unspecified, unspecified trimester: Secondary | ICD-10-CM | POA: Insufficient documentation

## 2022-12-04 DIAGNOSIS — E669 Obesity, unspecified: Secondary | ICD-10-CM

## 2022-12-04 DIAGNOSIS — E039 Hypothyroidism, unspecified: Secondary | ICD-10-CM | POA: Diagnosis not present

## 2022-12-04 DIAGNOSIS — O09523 Supervision of elderly multigravida, third trimester: Secondary | ICD-10-CM

## 2022-12-04 DIAGNOSIS — O0993 Supervision of high risk pregnancy, unspecified, third trimester: Secondary | ICD-10-CM

## 2022-12-04 DIAGNOSIS — O99891 Other specified diseases and conditions complicating pregnancy: Secondary | ICD-10-CM

## 2022-12-04 NOTE — Progress Notes (Signed)
   PRENATAL VISIT NOTE  Subjective:  Heather Kelly is a 41 y.o. [id number] at [redacted]w[redacted]d being seen today for ongoing prenatal care.  She is currently monitored for the following issues for this high-risk pregnancy and has Supervision of high risk pregnancy, antepartum; AMA (advanced maternal age) multigravida 35+; History of 2 cesarean deliveries, currently pregnant; Hypothyroidism affecting pregnancy; Alpha thalassemia silent carrier; Obesity affecting pregnancy, antepartum; OSA on CPAP; and GBS (group B Streptococcus carrier), +RV culture, currently pregnant on their problem list.  Patient reports no complaints.  Contractions: Not present. Vag. Bleeding: None.  Movement: Present. Denies leaking of fluid.   The following portions of the patient's history were reviewed and updated as appropriate: allergies, current medications, past family history, past medical history, past social history, past surgical history and problem list.   Objective:   Vitals:   12/04/22 1049  BP: 118/62  Pulse: 72  Weight: 258 lb 12.8 oz (117.4 kg)    Fetal Status: Fetal Heart Rate (bpm): 128   Movement: Present     General:  Alert, oriented and cooperative. Patient is in no acute distress.  Skin: Skin is warm and dry. No rash noted.   Cardiovascular: Normal heart rate noted  Respiratory: Normal respiratory effort, no problems with respiration noted  Abdomen: Soft, gravid, appropriate for gestational age.  Pain/Pressure: Present     Pelvic: Cervical exam performed in the presence of a chaperone Dilation: Fingertip Effacement (%): 30, 40 Station: -3  Extremities: Normal range of motion.  Edema: None  Mental Status: Normal mood and affect. Normal behavior. Normal judgment and thought content.   Assessment and Plan:  Pregnancy: [id number] at [redacted]w[redacted]d  1. Supervision of high risk pregnancy, antepartum - Vigorous movement assessed on exam  - FHT WDL  2. [redacted] weeks gestation of pregnancy - Follow-up visit next week - Plan  for delivery between 39-40 weeks  3. GBS (group B Streptococcus carrier), +RV culture, currently pregnant - abx in labor  4. Obesity affecting pregnancy, antepartum, unspecified obesity type - taking bASA  5. History of 2 cesarean deliveries, currently pregnant - Desires TOLAC  6. Multigravida of advanced maternal age in third trimester - Repeat growth scan today  7. Hypothyroidism affecting pregnancy in third trimester - followed by endocrinology  - Taking synthroid     Term labor symptoms and general obstetric precautions including but not limited to vaginal bleeding, contractions, leaking of fluid and fetal movement were reviewed in detail with the patient. Please refer to After Visit Summary for other counseling recommendations.   No follow-ups on file.  Future Appointments  Date Time Provider Department Center  12/04/2022  2:30 PM [facility] NURSE [facility]  12/04/2022  2:45 PM [facility]  12/13/2022  9:55 AM Lennart Pall, MD [facility] None    Corlis Hove, NP

## 2022-12-11 ENCOUNTER — Encounter: Payer: Medicaid Other | Admitting: Student

## 2022-12-13 ENCOUNTER — Ambulatory Visit (INDEPENDENT_AMBULATORY_CARE_PROVIDER_SITE_OTHER): Payer: Medicaid Other | Admitting: Obstetrics and Gynecology

## 2022-12-13 ENCOUNTER — Encounter: Payer: Self-pay | Admitting: Obstetrics and Gynecology

## 2022-12-13 VITALS — BP 122/80 | HR 84 | Wt 258.0 lb

## 2022-12-13 DIAGNOSIS — Z3A39 39 weeks gestation of pregnancy: Secondary | ICD-10-CM

## 2022-12-13 DIAGNOSIS — O0993 Supervision of high risk pregnancy, unspecified, third trimester: Secondary | ICD-10-CM

## 2022-12-13 DIAGNOSIS — O34219 Maternal care for unspecified type scar from previous cesarean delivery: Secondary | ICD-10-CM

## 2022-12-13 DIAGNOSIS — O0990 Supervision of high risk pregnancy, unspecified, unspecified trimester: Secondary | ICD-10-CM

## 2022-12-13 DIAGNOSIS — O99820 Streptococcus B carrier state complicating pregnancy: Secondary | ICD-10-CM

## 2022-12-13 NOTE — Progress Notes (Addendum)
   PRENATAL VISIT NOTE  Subjective:  Heather Kelly is a 41 y.o. [name] at [redacted]w[redacted]d being seen today for ongoing prenatal care.  She is currently monitored for the following issues for this high-risk pregnancy and has Supervision of high risk pregnancy, antepartum; AMA (advanced maternal age) multigravida 35+; History of 2 cesarean deliveries, currently pregnant; Hypothyroidism affecting pregnancy; Alpha thalassemia silent carrier; BMI 30.0-30.9,adult; OSA on CPAP; and GBS (group B Streptococcus carrier), +RV culture, currently pregnant on their problem list.  Patient reports she is doing okay overall.  Contractions: Irregular. Vag. Bleeding: None.  Movement: Present. Denies leaking of fluid.   The following portions of the patient's history were reviewed and updated as appropriate: allergies, current medications, past family history, past medical history, past social history, past surgical history and problem list.   Objective:   Vitals:   12/13/22 1005  BP: 122/80  Pulse: 84  Weight: 258 lb (117 kg)   Fetal Status: Fetal Heart Rate (bpm): 140   Movement: Present     General:  Alert, oriented and cooperative. Patient is in no acute distress.  Skin: Skin is warm and dry. No rash noted.   Cardiovascular: Normal heart rate noted  Respiratory: Normal respiratory effort, no problems with respiration noted  Abdomen: Soft, gravid, appropriate for gestational age.  Pain/Pressure: Present     SCE performed with chaperone. Cl/th/hi  Assessment and Plan:  Pregnancy: [id number] at [redacted]w[redacted]d 1. Supervision of high risk pregnancy, antepartum Cervix closed on SCE today  2. History of 2 cesarean deliveries, currently pregnant Discussed delivery plan if she is still pregnant at [redacted] weeks. Reviewed recommendation for delivery by 41 weeks. Discussed repeat cesarean delivery vs induction of labor.  Reviewed that our main concern TOLAC is uterine rupture with subsequent maternal/fetal injury or death. Discussed  that 2 prior cesarean deliveries is at higher risk of rupture compared with 1 prior cesarean delivery. Also reviewed that induced labor can have a higher risk of rupture compared with spontaneous labor. We reviewed that uterine rupture is unpredictable and can occur suddenly. Having 2 prior cesareans means she may have scar tissue that makes it difficult to get the baby delivered quickly and safely and puts her at higher risk of injury related to adhesive disease. The data is limited but there are no clearly defined contraindication to an induction of labor in the setting of two prior cesarean deliveries. Reviewed that she needs to be accepting of these risks. After discussion, she would like to plan for a 41wk IOL if she does not spontaneously labor. IOL ordered  3. GBS (group B Streptococcus carrier), +RV culture, currently pregnant GBS positive, reviewed today  Term labor symptoms and general obstetric precautions including but not limited to vaginal bleeding, contractions, leaking of fluid and fetal movement were reviewed in detail with the patient.  Return in about 1 week (around 12/20/2022).  Future Appointments  Date Time Provider Department Center  12/19/2022  9:55 AM Brand Males, CNM [facility] None  12/19/2022  1:00 PM [facility] NURSE [facility]  12/19/2022  1:15 PM [facility] NST [facility]   Lennart Pall, MD

## 2022-12-16 NOTE — L&D Delivery Note (Signed)
OB/GYN Faculty Practice Delivery Note  Heather Kelly is a 42 y.o. [id number] s/p VBAC at [redacted]w[redacted]d. She was admitted for IOL 2/2 NRNST (late decels). TOLAC, C/S x2.   ROM: 7h 50m with clear, bloody fluid GBS Status:  Positive/-- (12/14 1143) Maximum Maternal Temperature: 99.6  Labor Progress: Initial SVE: 3/70/-3. She then progressed to complete.   Delivery Date/Time: 12/20/2022 @1947  Delivery: Called to room and patient was complete and pushing. Head delivered LOA. No nuchal cord present. Shoulder and body delivered in usual fashion. Infant with spontaneous cry, placed on mother's abdomen, dried and stimulated. Cord clamped x 2 after 1-minute delay, and cut by FOB. Cord blood drawn. Placenta delivered spontaneously with gentle cord traction. Fundus firm with massage and Pitocin. Labia, perineum, vagina, and cervix inspected with a 1st degree perineal deep defect that was repaired with 2.0 vicryl.  Baby Weight: pending  Placenta: 3 vessel, intact. Sent to pathology, evidence large clot with delivery not previously seen on [place]  Complications: None Lacerations: 1st degree perineal laceration EBL: 327 mL Analgesia: Epidural   Infant:  APGAR (1 MIN): 9   APGAR (5 MINS): [place], DO OB Fellow, [place] for [employer] 12/20/2022, 8:38 PM

## 2022-12-18 ENCOUNTER — Telehealth: Payer: Self-pay | Admitting: Obstetrics and Gynecology

## 2022-12-18 ENCOUNTER — Encounter: Payer: Self-pay | Admitting: Obstetrics and Gynecology

## 2022-12-18 NOTE — Telephone Encounter (Signed)
Called patient to discuss plan of care.   Reviewed that our group's consensus is that we do not offer induction of labor to patients with two prior cesarean sections. I apologized that I was unaware of this consensus when we spoke at her last appointment.   We reviewed the rationale behind our group's consensus. We discussed lack of safety data and elevated risk of uterine rupture when compared to 1 prior cesarean delivery and when comparing spontaneous labor with augmentation/induction with pitocin. For more details of our prior discussion related to uterine rupture and risks, please see my note from 12/13/22. I also reviewed low likelihood of successful VBAC if she has not spontaneously labored by 41 weeks. [facility]'s calculator estimates a 20-30% likelihood of successful VBAC if she is admitted with a closed cervix (note - this calculator is validated in patients with 1 prior cesarean delivery). Counseled patient that risks of a labored cesarean section are significantly higher than a planned cesarean delivery.   In light of these risks and low likelihood of a successful VBAC, I recommended a scheduled cesarean delivery at 41 weeks. I also offered twice weekly NSTs with scheduled cesarean delivery at [redacted]w[redacted]d or [redacted]w[redacted]d.   She does not want to go beyond 41 weeks due to fetal risks, but she strongly & adamantly declines a scheduled cesarean delivery. She states that she would start taking castor oil to try to induce labor at home and avoid cesarean. I discouraged castor oil use - reviewed that prior CS is a contraindication due to risk of uterine rupture. We reviewed her motivations. She feels like she was stripped of a choice in her care in her prior pregnancies and her bodily autonomy is not being respected. It is important to her that she has a choice in her mode of delivery. She states that she understands that we recommend a cesarean delivery if she does not spontaneously labor, but she is accepting of the  risks of a TOLAC. She will not accept a scheduled cesarean delivery.  I reviewed her plan of care with Dr. Kennon Rounds. Given that the patient has been extensively counseled and she is not accepting of a c section, we will proceed with an induction of labor. Message routed to our office staff to assist with scheduling IOL (previously ordered) and to L&D call teams for when patient would be scheduled for IOL.   Gale Journey, MD [place], [facility] for [employer], [place]

## 2022-12-19 ENCOUNTER — Ambulatory Visit (INDEPENDENT_AMBULATORY_CARE_PROVIDER_SITE_OTHER): Payer: Medicaid Other

## 2022-12-19 ENCOUNTER — Encounter: Payer: Medicaid Other | Admitting: Obstetrics and Gynecology

## 2022-12-19 ENCOUNTER — Ambulatory Visit (HOSPITAL_BASED_OUTPATIENT_CLINIC_OR_DEPARTMENT_OTHER): Payer: Medicaid Other | Admitting: *Deleted

## 2022-12-19 ENCOUNTER — Encounter (HOSPITAL_COMMUNITY): Payer: Self-pay | Admitting: Family Medicine

## 2022-12-19 ENCOUNTER — Ambulatory Visit: Payer: Medicaid Other | Admitting: *Deleted

## 2022-12-19 ENCOUNTER — Inpatient Hospital Stay (HOSPITAL_COMMUNITY)
Admission: AD | Admit: 2022-12-19 | Discharge: 2022-12-22 | DRG: 807 | Disposition: A | Discharge status: Home / Self Care | Payer: Medicaid Other | Attending: Obstetrics and Gynecology | Admitting: Obstetrics and Gynecology

## 2022-12-19 VITALS — BP 132/63 | HR 76 | Wt 260.8 lb

## 2022-12-19 DIAGNOSIS — O99284 Endocrine, nutritional and metabolic diseases complicating childbirth: Secondary | ICD-10-CM | POA: Diagnosis present

## 2022-12-19 DIAGNOSIS — O34219 Maternal care for unspecified type scar from previous cesarean delivery: Secondary | ICD-10-CM

## 2022-12-19 DIAGNOSIS — O0990 Supervision of high risk pregnancy, unspecified, unspecified trimester: Secondary | ICD-10-CM

## 2022-12-19 DIAGNOSIS — O288 Other abnormal findings on antenatal screening of mother: Secondary | ICD-10-CM

## 2022-12-19 DIAGNOSIS — Z87891 Personal history of nicotine dependence: Secondary | ICD-10-CM | POA: Diagnosis not present

## 2022-12-19 DIAGNOSIS — D563 Thalassemia minor: Secondary | ICD-10-CM | POA: Diagnosis present

## 2022-12-19 DIAGNOSIS — Z3A40 40 weeks gestation of pregnancy: Secondary | ICD-10-CM

## 2022-12-19 DIAGNOSIS — O480 Post-term pregnancy: Secondary | ICD-10-CM

## 2022-12-19 DIAGNOSIS — Z148 Genetic carrier of other disease: Secondary | ICD-10-CM

## 2022-12-19 DIAGNOSIS — O34211 Maternal care for low transverse scar from previous cesarean delivery: Secondary | ICD-10-CM | POA: Diagnosis not present

## 2022-12-19 DIAGNOSIS — O99820 Streptococcus B carrier state complicating pregnancy: Secondary | ICD-10-CM

## 2022-12-19 DIAGNOSIS — E039 Hypothyroidism, unspecified: Secondary | ICD-10-CM | POA: Diagnosis present

## 2022-12-19 DIAGNOSIS — O99824 Streptococcus B carrier state complicating childbirth: Secondary | ICD-10-CM | POA: Diagnosis present

## 2022-12-19 DIAGNOSIS — O9902 Anemia complicating childbirth: Secondary | ICD-10-CM | POA: Diagnosis present

## 2022-12-19 DIAGNOSIS — O0993 Supervision of high risk pregnancy, unspecified, third trimester: Secondary | ICD-10-CM

## 2022-12-19 DIAGNOSIS — Z23 Encounter for immunization: Secondary | ICD-10-CM | POA: Diagnosis not present

## 2022-12-19 DIAGNOSIS — O09529 Supervision of elderly multigravida, unspecified trimester: Secondary | ICD-10-CM

## 2022-12-19 HISTORY — DX: Sleep apnea, unspecified: G47.30

## 2022-12-19 LAB — CBC
HCT: 37.6 % (ref 36.0–46.0)
Hemoglobin: 11.5 g/dL — ABNORMAL LOW (ref 12.0–15.0)
MCH: 26.3 pg (ref 26.0–34.0)
MCHC: 30.6 g/dL (ref 30.0–36.0)
MCV: 85.8 fL (ref 80.0–100.0)
Platelets: 312 10*3/uL (ref 150–400)
RBC: 4.38 MIL/uL (ref 3.87–5.11)
RDW: 14.2 % (ref 11.5–15.5)
WBC: 6.9 10*3/uL (ref 4.0–10.5)
nRBC: 0 % (ref 0.0–0.2)

## 2022-12-19 MED ORDER — SODIUM CHLORIDE 0.9 % IV SOLN
5.0000 10*6.[IU] | Freq: Once | INTRAVENOUS | Status: AC
  Administered 2022-12-19: 5 10*6.[IU] via INTRAVENOUS
  Filled 2022-12-19: qty 5

## 2022-12-19 MED ORDER — LACTATED RINGERS IV SOLN: INTRAVENOUS | Status: DC

## 2022-12-19 MED ORDER — PENICILLIN G POT IN DEXTROSE 60000 UNIT/ML IV SOLN
3.0000 10*6.[IU] | INTRAVENOUS | Status: DC
  Administered 2022-12-20 (×5): 3 10*6.[IU] via INTRAVENOUS
  Filled 2022-12-19 (×7): qty 50

## 2022-12-19 NOTE — MAU Provider Note (Addendum)
   PROCEDURAL NOTE  Subjective:  Heather Kelly is a 42 y.o. [id number] at [redacted]w[redacted]d was sent to MAU from MFM for IOL d/t NRNST.  She is currently monitored for the following issues for this high-risk pregnancy and has Supervision of high risk pregnancy, antepartum; AMA (advanced maternal age) multigravida 71+; History of 2 cesarean deliveries, currently pregnant; Hypothyroidism affecting pregnancy; Alpha thalassemia silent carrier; BMI 30.0-30.9,adult; OSA on CPAP; and GBS (group B Streptococcus carrier), +RV culture, currently pregnant on their problem list.  Patient reports occasional contractions since being seen in the office and having membranes swept. She reports she passed a big mucous plug also   . Vag. Bleeding: None.   . Denies leaking of fluid.   The following portions of the patient's history were reviewed and updated as appropriate: allergies, current medications, past family history, past medical history, past social history, past surgical history and problem list. Problem list updated.  Objective:   Vitals:   12/19/22 1530 12/19/22 1537  BP: 105/70 102/69  Pulse: 76 84  Resp: 18   Temp: 98.1 F (36.7 C)   TempSrc: Oral   SpO2: 99%   Weight: 118.8 kg   Height: 5\' 9"  (1.753 m)      General:  Alert, oriented and cooperative. Patient is in no acute distress.  Skin: Skin is warm and dry. No rash noted.   Cardiovascular: Normal heart rate noted  Respiratory: Normal respiratory effort, no problems with respiration noted  Abdomen: Soft, gravid, appropriate for gestational age.        Pelvic: Cervical exam performed        Extremities: Normal range of motion.     Mental Status:  Normal mood and affect. Normal behavior. Normal judgment and thought content.  Procedure: Patient informed of Risks, benefits, and alternatives of procedure.   Procedure done to begin ripening of the cervix prior to admission for induction of labor. Appropriate time out taken. The patient was placed in the  lithotomy position and the cervix brought into view with sterile speculum. A ring forcep was used to guide the 90F foley through the internal os of the cervix. Foley Balloon filled with 60cc of sterile water. Plug inserted into end of the foley. Foley placed on tension and taped to medial thigh. All equipment was removed and accounted for. The patient tolerated the procedure well.   NST:  EFM Baseline: 135 bpm, Variability: Good {> 6 bpm), Accelerations: Reactive, and Decelerations: Absent  Toco: irregular There were no signs of tachysystole or hypertonus.  Assessment and Plan:  Pregnancy: [id number] at [redacted]w[redacted]d 1. Indication for care in labor or delivery - CBC; Standing - Type and screen; Standing - RPR; Standing - CBC - Type and screen - RPR  S/p Outpatient placement of foley balloon catheter for cervical ripening. Induction of labor later today. Reassuring FHR tracing with no concerns at present. Warning signs given to patient to include return to MAU for heavy vaginal bleeding, Rupture of membranes, painful uterine contractions q 5 mins or less, severe abdominal discomfort, decreased fetal movement.  Laury Deep, CNM 12/19/2022 6:14 PM

## 2022-12-19 NOTE — MAU Note (Signed)
.  Heather Kelly is a 42 y.o. at [redacted]w[redacted]d here in MAU reporting: was seen in the office and had NST and membrane sweep. Patient reports that baby was dropping their HR during contractions and so the office sent her over for evaluation. She reports intermittent ctx (2/10) since the membrane sweep. Denies VB but reports small amount of brownish discharge today. Reports good FM.  LMP: N/A Onset of complaint: Today Pain score: 2/10 Vitals:   12/19/22 1530  BP: 105/70  Pulse: 76  Resp: 18  Temp: 98.1 F (36.7 C)  SpO2: 99%     FHT:145 Lab orders placed from triage:  UA

## 2022-12-19 NOTE — Progress Notes (Signed)
   PRENATAL VISIT NOTE  Subjective:  Heather Kelly is a 42 y.o. [id number] at [redacted]w[redacted]d being seen today for ongoing prenatal care.  She is currently monitored for the following issues for this high-risk pregnancy and has Supervision of high risk pregnancy, antepartum; AMA (advanced maternal age) multigravida 63+; History of 2 cesarean deliveries, currently pregnant; Hypothyroidism affecting pregnancy; Alpha thalassemia silent carrier; BMI 30.0-30.9,adult; OSA on CPAP; and GBS (group B Streptococcus carrier), +RV culture, currently pregnant on their problem list.  Patient reports no complaints.  Contractions: Irritability. Vag. Bleeding: None.  Movement: Present. Denies leaking of fluid.   The following portions of the patient's history were reviewed and updated as appropriate: allergies, current medications, past family history, past medical history, past social history, past surgical history and problem list.   Objective:   Vitals:   12/19/22 1013  BP: 132/63  Pulse: 76  Weight: 260 lb 12.8 oz (118.3 kg)    Fetal Status: Fetal Heart Rate (bpm): 138   Movement: Present  Presentation: Vertex  General:  Alert, oriented and cooperative. Patient is in no acute distress.  Skin: Skin is warm and dry. No rash noted.   Cardiovascular: Normal heart rate noted  Respiratory: Normal respiratory effort, no problems with respiration noted  Abdomen: Soft, gravid, appropriate for gestational age.  Pain/Pressure: Present     Pelvic: Cervical exam performed in the presence of a chaperone Dilation: 1 Effacement (%): Thick Station: -3  Extremities: Normal range of motion.  Edema: None  Mental Status: Normal mood and affect. Normal behavior. Normal judgment and thought content.   Assessment and Plan:  Pregnancy: [id number] at [redacted]w[redacted]d 1. Supervision of high risk pregnancy, antepartum - Routine OB. Doing well, no concerns - 41 week IOL orders placed - Membrane sweep today per patient request - NST deferred as  patient has one scheduled at MFM today at 1pm  2. [redacted] weeks gestation of pregnancy - Endorses active fetal movement  3. History of 2 cesarean deliveries, currently pregnant - Desires TOLAC. See previous encounter notes  4. GBS (group B Streptococcus carrier), +RV culture, currently pregnant - PCN in labor  Term labor symptoms and general obstetric precautions including but not limited to vaginal bleeding, contractions, leaking of fluid and fetal movement were reviewed in detail with the patient. Please refer to After Visit Summary for other counseling recommendations.   No follow-ups on file.  Future Appointments  Date Time Provider [place]  12/19/2022  1:00 PM [facility] NURSE [facility]  12/19/2022  1:15 PM [facility] NST [facility] [place], CNM

## 2022-12-19 NOTE — H&P (Signed)
Heather Kelly is an 42 y.o. [phone] 70w1dfemale.   Chief Complaint: decels in the office HPI: Patient with h/o C-s x 2. One failed induction at 3 cm. 2nd elective repeat. [facility] She is aware of risks with IOL and 2 prior C-sections. She is agreeable for RCS if baby does not look good but wants the chance to attempt VBAC.  PNC: Heather Kelly  OB History     Gravida  4   Para  2   Term  2   Preterm  0   AB  1   Living  2      SAB  1   IAB  0   Ectopic  0   Multiple  0   Live Births  2           Past Medical History:  Diagnosis Date   Hypothyroidism     Past Surgical History:  Procedure Laterality Date   BARIATRIC SURGERY  04/10/2021   CESAREAN SECTION  2011   CESAREAN SECTION  2007    Family History  Problem Relation Age of Onset   Hypertension Mother    Cancer Father    Asthma Daughter    Heart disease Maternal Aunt    Hypertension Maternal Grandmother    Breast cancer Maternal Grandmother    Cancer Maternal Grandfather    Diabetes Neg Hx    Social History:  reports that she quit smoking about 6 years ago. Her smoking use included cigarettes. She has never used smokeless tobacco. She reports that she does not currently use alcohol. She reports that she does not use drugs.   No Known Allergies  Medications Prior to Admission  Medication Sig Dispense Refill   aspirin 81 MG chewable tablet Chew 1 tablet (81 mg total) by mouth daily. 30 tablet 6   EQL Evening Primrose Oil 500 MG CAPS Take by mouth.     levothyroxine (SYNTHROID) 75 MCG tablet Take 75 mcg by mouth daily.     Prenat-FeCbn-FeAsp-Meth-FA-DHA (PRENATE MINI) 18-0.6-0.4-350 MG CAPS Take 1 capsule by mouth daily. 30 capsule 11   VITAMIN D PO Take by mouth.     Blood Pressure Monitoring (BLOOD PRESSURE KIT) DEVI 1 Device by Does not apply route once a week. 1 each 0     A comprehensive review of systems was negative.  Objective: Blood pressure (!) 93/57, pulse 75,  temperature 98.1 F (36.7 C), temperature source Oral, resp. rate 17, height _0  (1.753 m), weight 118.8 kg, last menstrual period 03/13/2022, SpO2 100 %, unknown if currently breastfeeding. General appearance: alert, cooperative, and appears stated age Head: Normocephalic, without obvious abnormality, atraumatic Neck: supple, symmetrical, trachea midline Lungs:  normal effort Heart: regular rate and rhythm Abdomen:  gravid, non-tender Extremities: edema tr Skin: Skin color, texture, turgor normal. No rashes or lesions Neurologic: Grossly normal   Labs: Results for orders placed or performed during the hospital encounter of 12/19/22 (from the past 24 hour(s))  Type and screen     Status: None   Collection Time: 12/19/22  4:49 PM  Result Value Ref Range   ABO/RH(D) B NEG    Antibody Screen NEG    Sample Expiration      12/22/2022,2359 [facility]formed [facility] [address], [place] [id number]  CBC     Status: Abnormal   Collection Time: 12/19/22  4:58 PM  Result Value Ref Range   WBC 6.9 4.0 - 10.5  K/uL   RBC 4.38 3.87 - 5.11 MIL/uL   Hemoglobin 11.5 (L) 12.0 - 15.0 g/dL   HCT 37.6 36.0 - 46.0 %   MCV 85.8 80.0 - 100.0 fL   MCH 26.3 26.0 - 34.0 pg   MCHC 30.6 30.0 - 36.0 g/dL   RDW 14.2 11.5 - 15.5 %   Platelets 312 150 - 400 K/uL   nRBC 0.0 0.0 - 0.2 %            ABO, Rh: --/--/B NEG (01/04 1649)  Antibody: NEG (01/04 1649)  Rubella: 10.60 (06/21 1124)  RPR: Non Reactive (10/19 1045)  HBsAg: Negative (06/21 1124)  HIV: Non Reactive (10/19 1045)  GBS: Positive/-- (12/14 1143)    Radiology studies: [place] MFM OB FOLLOW UP  Result Date: 12/04/2022 ----------------------------------------------------------------------  OBSTETRICS REPORT                       (Signed Final 12/04/2022 04:01 pm) ---------------------------------------------------------------------- Patient Info  ID #:       [id number]                          D.O.B.:  [birth year] (41 yrs)   Name:       Heather Kelly                    Visit Date: 12/04/2022 02:48 pm ---------------------------------------------------------------------- Performed By  Attending:        Valeda Malm DO       Referred By:      Johnston Ebbs  Performed By:     Vance Peper BS,      [facility], [facility]                                [facility] at                                                             [place] for                                                             Women ---------------------------------------------------------------------- Orders  #  Description                           Code        Ordered By  1  [place] MFM OB FOLLOW UP                   01093.23    Valeda Malm ----------------------------------------------------------------------  #  Order #                     Accession #                Episode #  1  [id number]                   [id number]                 [id number] ---------------------------------------------------------------------- Indications  Advanced maternal age multigravida 15+,        O74.523  third trimester (39)  Obesity complicating pregnancy, third          O99.213  trimester (pre-G BMI 30)  Previous cesarean delivery, antepartum (x2)    O34.219  Hypothyroid (synthroid)                        O99.280 E03.9  Genetic carrier (Silent Carrier Alpha Thal)    Z14.8  [redacted] weeks gestation of pregnancy                Z3A.38  LR NIPS ---------------------------------------------------------------------- Fetal Evaluation  Num Of Fetuses:         1  Fetal Heart Rate(bpm):  129  Cardiac Activity:       Observed  Presentation:           Cephalic  Placenta:               Posterior  P. Cord Insertion:      Previously Visualized  Amniotic Fluid  AFI FV:      Within normal limits  AFI Sum(cm)     %Tile       Largest Pocket(cm)  12              41          4.8  RUQ(cm)       RLQ(cm)       LUQ(cm)        LLQ(cm)  3.4           1.7           2.1             4.8 ---------------------------------------------------------------------- Biometry  BPD:      93.5  mm     G. Age:  38w 0d         74  %    CI:        78.58   %    70 - 86                                                          FL/HC:      22.3   %    20.9 - 22.7  HC:      333.6  mm     G. Age:  38w 1d         32  %    HC/AC:      1.01        0.92 - 1.05  AC:      331.7  mm     G. Age:  37w 1d         41  %    FL/BPD:     79.7   %    71 - 87  FL:       74.5  mm     G. Age:  38w 1d         56  %  FL/AC:      22.5   %    20 - 24  LV:        3.9  mm  Est. FW:    3244  gm      7 lb 2 oz     51  % ---------------------------------------------------------------------- OB History  Gravidity:    4         Term:   2        Prem:   0        SAB:   1  TOP:          0       Ectopic:  0        Living: 2 ---------------------------------------------------------------------- Gestational Age  LMP:           38w 0d        Date:  03/13/22                  EDD:   12/18/22  U/S Today:     37w 6d                                        EDD:   12/19/22  Best:          38w 0d     Det. By:  U/S C R L  (05/17/22)    EDD:   12/18/22 ---------------------------------------------------------------------- Anatomy  Cranium:               Appears normal         LVOT:                   Previously seen  Cavum:                 Appears normal         Aortic Arch:            Previously seen  Ventricles:            Previously seen        Ductal Arch:            Previously seen  Choroid Plexus:        Previously seen        Diaphragm:              Appears normal  Cerebellum:            Previously seen        Stomach:                Appears normal, left                                                                        sided  Posterior Fossa:       Previously seen        Abdomen:                Appears normal  Nuchal Fold:           Previously seen        Abdominal Wall:  Previously seen  Face:                  Orbits prev; profile   Cord Vessels:            Previously seen                         not well visualized  Lips:                  Appears normal         Kidneys:                Appear normal  Palate:                Not well visualized    Bladder:                Appears normal  Thoracic:              Previously seen        Spine:                  Previously seen  Heart:                 Appears normal         Upper Extremities:      Previously seen                         (4CH, axis, and                         situs)  RVOT:                  Previously seen        Lower Extremities:      Previously seen  Other:  Heels/feet and open RT hand /5th digits, VC, 3VV and 3VTV          previously visualized. Parents do not wish to know sex of fetus. ---------------------------------------------------------------------- Cervix Uterus Adnexa  Cervix  Not visualized (advanced GA >24wks) ---------------------------------------------------------------------- Comments  Follow-up growth ultrasound at 38w 0d with EDD of  12/18/2022 dated by U/S C R L  (05/17/22). Pregnancy  complicated by AMA, obesity and prior CD.  Sonographic findings  Single intrauterine pregnancy at 38w 0d.  Observed fetal cardiac activity.  Cephalic presentation.  Interval fetal anatomy appears normal.  Fetal biometry shows the estimated fetal weight at the 51  percentile.  Amniotic fluid volume: Within normal limits. AFI: 12. cm.  MVP: 4.8 cm.  Placenta: Posterior.  Recommendations  - Delivery around 39 weeks  - Weekly antenatal testing if she does beyond her due date ----------------------------------------------------------------------                   Valeda Malm, DO Electronically Signed Final Report   12/04/2022 04:01 pm ----------------------------------------------------------------------   Prenatal Transfer Tool  Maternal Diabetes: No Genetic Screening: Normal NIPT, Carrier Alpha Thal Maternal Ultrasounds/Referrals: Normal Fetal Ultrasounds or other Referrals:  None Maternal  Substance Abuse:  No Significant Maternal Medications:  Meds include: Syntroid Other: Synthroid, ASA Significant Maternal Lab Results: Group B Strep positive  Assessment/Plan Active Problems:   AMA (advanced maternal age) multigravida 35+   History of 2 cesarean deliveries, currently pregnant   Hypothyroidism affecting pregnancy   Alpha thalassemia silent carrier   GBS (group B Streptococcus carrier), +RV culture,  currently pregnant   #Labor: Foley in place, for low dose pitocin #Pain: Epidural if desires #FWB: Reassuring #ID:  Will need PCN #MOF: Breast #MOC: Unsure #Circ: yes  #Anemia: Hgb 11.5  Patient has had extensive counseling about risks of VBAC with 2 prior C-sections. She is well aware of all the risks associated with this including rupture. She is not opposed to RCS for fetal indications if necessary. She has not had a true arrest of dilation or arrest of descent. She has a 60-80% chance of success. She has been appropriately counseled and is ok for TOLAC with low dose pitocin following foley balloon ripening.   Donnamae Jude 12/19/2022, 7:03 PM

## 2022-12-19 NOTE — Procedures (Signed)
Heather Kelly [birth year] [redacted]w[redacted]d  Fetus A Non-Stress Test Interpretation for 12/19/22  Indication:  postdates 40.1  Fetal Heart Rate A Mode: External Baseline Rate (A): 140 bpm Variability: Minimal, Moderate Accelerations: 15 x 15 Decelerations: Late, Variable Multiple birth?: No  Uterine Activity Mode: Palpation, Toco Contraction Frequency (min): 4-5 Contraction Duration (sec): 50-70 Contraction Quality: Mild Resting Tone Palpated: Relaxed Resting Time: Adequate  Interpretation (Fetal Testing) Nonstress Test Interpretation: Reactive Overall Impression:  (3 late decels noted after uc's) Comments: Dr. Donalee Citrin reviewed tracing. Patient sent to MAU to discuss delivery with OB provider.

## 2022-12-19 NOTE — MAU Provider Note (Signed)
History     CSN: [id number]  Arrival date and time: 12/19/22 1439   Event Date/Time   First Provider Initiated Contact with Patient 12/19/22 1538      Chief Complaint  Patient presents with   Contractions   HPI  Heather Kelly is a 42 y.o. [id number] at 52w1dwho presents for evaluation of NRNST. Patient reports she was in the office for her post dates NST and was told the baby didn't look good. She reports intermittent contractions. She denies any bleeding or leaking. She reports normal fetal movement.    Patient has a hx of two previous c/s and strongly desires TOLAC. She has been extensively counseled regarding risks which is well documented in EPIC.  OB History     Gravida  4   Para  2   Term  2   Preterm  0   AB  1   Living  2      SAB  1   IAB  0   Ectopic  0   Multiple  0   Live Births  2           Past Medical History:  Diagnosis Date   Hypothyroidism     Past Surgical History:  Procedure Laterality Date   BARIATRIC SURGERY  04/10/2021   CESAREAN SECTION  2011   CESAREAN SECTION  2007    Family History  Problem Relation Age of Onset   Hypertension Mother    Cancer Father    Asthma Daughter    Heart disease Maternal Aunt    Hypertension Maternal Grandmother    Breast cancer Maternal Grandmother    Cancer Maternal Grandfather    Diabetes Neg Hx     Social History   Tobacco Use   Smoking status: Former    Types: Cigarettes    Quit date: 03/03/2016    Years since quitting: 6.8   Smokeless tobacco: Never  Vaping Use   Vaping Use: Never used  Substance Use Topics   Alcohol use: Not Currently   Drug use: No    Allergies: No Known Allergies  Medications Prior to Admission  Medication Sig Dispense Refill Last Dose   aspirin 81 MG chewable tablet Chew 1 tablet (81 mg total) by mouth daily. 30 tablet 6 12/19/2022   EQL Evening Primrose Oil 500 MG CAPS Take by mouth.   12/19/2022   levothyroxine (SYNTHROID) 75 MCG tablet Take 75 mcg by  mouth daily.   12/19/2022   Prenat-FeCbn-FeAsp-Meth-FA-DHA (PRENATE MINI) 18-0.6-0.4-350 MG CAPS Take 1 capsule by mouth daily. 30 capsule 11 12/19/2022   VITAMIN D PO Take by mouth.   12/19/2022   Blood Pressure Monitoring (BLOOD PRESSURE KIT) DEVI 1 Device by Does not apply route once a week. 1 each 0     Review of Systems  Constitutional: Negative.  Negative for fatigue and fever.  HENT: Negative.    Respiratory: Negative.  Negative for shortness of breath.   Cardiovascular: Negative.  Negative for chest pain.  Gastrointestinal: Negative.  Negative for abdominal pain, constipation, diarrhea, nausea and vomiting.  Genitourinary: Negative.  Negative for dysuria, vaginal bleeding and vaginal discharge.  Neurological: Negative.  Negative for dizziness and headaches.   Physical Exam   Blood pressure 102/69, pulse 84, temperature 98.1 F (36.7 C), temperature source Oral, resp. rate 18, height _0  (1.753 m), weight 118.8 kg, last menstrual period 03/13/2022, SpO2 99 %, unknown if currently breastfeeding.  Patient Vitals for the past 24 hrs:  BP Temp Temp src Pulse Resp SpO2 Height Weight  12/19/22 1537 102/69 -- -- 84 -- -- -- --  12/19/22 1530 105/70 98.1 F (36.7 C) Oral 76 18 99 % _0  (1.753 m) 118.8 kg    Physical Exam Vitals and nursing note reviewed.  Constitutional:      General: She is not in acute distress.    Appearance: She is well-developed.  HENT:     Head: Normocephalic.  Eyes:     Pupils: Pupils are equal, round, and reactive to light.  Cardiovascular:     Rate and Rhythm: Normal rate and regular rhythm.     Heart sounds: Normal heart sounds.  Pulmonary:     Effort: Pulmonary effort is normal. No respiratory distress.     Breath sounds: Normal breath sounds.  Abdominal:     General: Bowel sounds are normal. There is no distension.     Palpations: Abdomen is soft.     Tenderness: There is no abdominal tenderness.  Skin:    General: Skin is warm and dry.   Neurological:     Mental Status: She is alert and oriented to person, place, and time.  Psychiatric:        Mood and Affect: Mood normal.        Behavior: Behavior normal.        Thought Content: Thought content normal.        Judgment: Judgment normal.     Fetal Tracing:  Baseline: 130 Variability: moderate Accels:  10x10 Decels: none  Toco: occasional uc's  MAU Course  Procedures  MDM  Dr. Donalee Citrin called report to CNM regarding this patient. States NST had 3 late decelerations and not reactive. Sent over to MAU for monitoring and to discuss delivery.   NST reassuring in MAU but not reactive x1 hour.  Dr. Kennon Rounds consulted regarding tracing and plan for delivery- MD recommends that patient stay for delivery and aware of desires for TOLAC. MD recommends foley bulb placement for IOL  Assessment and Plan   1. Indication for care in labor or delivery   2. Supervision of high risk pregnancy, antepartum   3. Non-reactive NST (non-stress test)    -Admit to labor and delivery -Report called to labor team  Wende Mott, CNM 12/19/2022, 3:39 PM

## 2022-12-19 NOTE — Progress Notes (Signed)
Patient presents for [place] visit. Pt scheduled for NST this evening. Pt would like to know if she can be induced sooner rather than later. No other concerns at this time.

## 2022-12-20 ENCOUNTER — Inpatient Hospital Stay (HOSPITAL_COMMUNITY): Payer: Medicaid Other | Admitting: Anesthesiology

## 2022-12-20 ENCOUNTER — Encounter (HOSPITAL_COMMUNITY): Payer: Self-pay | Admitting: Family Medicine

## 2022-12-20 DIAGNOSIS — O34211 Maternal care for low transverse scar from previous cesarean delivery: Secondary | ICD-10-CM

## 2022-12-20 DIAGNOSIS — O480 Post-term pregnancy: Secondary | ICD-10-CM

## 2022-12-20 DIAGNOSIS — O34219 Maternal care for unspecified type scar from previous cesarean delivery: Secondary | ICD-10-CM | POA: Insufficient documentation

## 2022-12-20 DIAGNOSIS — O99824 Streptococcus B carrier state complicating childbirth: Secondary | ICD-10-CM

## 2022-12-20 DIAGNOSIS — Z3A40 40 weeks gestation of pregnancy: Secondary | ICD-10-CM

## 2022-12-20 LAB — TYPE AND SCREEN
ABO/RH(D): B NEG
Antibody Screen: NEGATIVE

## 2022-12-20 LAB — RPR: RPR Ser Ql: NONREACTIVE

## 2022-12-20 MED ORDER — SENNOSIDES-DOCUSATE SODIUM 8.6-50 MG PO TABS
2.0000 | ORAL_TABLET | Freq: Every day | ORAL | Status: DC
  Administered 2022-12-21 – 2022-12-22 (×2): 2 via ORAL
  Filled 2022-12-20 (×2): qty 2

## 2022-12-20 MED ORDER — OXYCODONE-ACETAMINOPHEN 5-325 MG PO TABS: 1.0000 | ORAL_TABLET | ORAL | Status: DC | PRN

## 2022-12-20 MED ORDER — ZOLPIDEM TARTRATE 5 MG PO TABS: 5.0000 mg | ORAL_TABLET | Freq: Every evening | ORAL | Status: DC | PRN

## 2022-12-20 MED ORDER — TERBUTALINE SULFATE 1 MG/ML IJ SOLN: 0.2500 mg | Freq: Once | INTRAMUSCULAR | Status: DC | PRN

## 2022-12-20 MED ORDER — SIMETHICONE 80 MG PO CHEW
80.0000 mg | CHEWABLE_TABLET | ORAL | Status: DC | PRN
  Administered 2022-12-22 (×2): 80 mg via ORAL
  Filled 2022-12-20 (×2): qty 1

## 2022-12-20 MED ORDER — DIPHENHYDRAMINE HCL 50 MG/ML IJ SOLN: 12.5000 mg | INTRAMUSCULAR | Status: DC | PRN

## 2022-12-20 MED ORDER — OXYTOCIN BOLUS FROM INFUSION
333.0000 mL | Freq: Once | INTRAVENOUS | Status: AC
  Administered 2022-12-20: 333 mL via INTRAVENOUS

## 2022-12-20 MED ORDER — TRANEXAMIC ACID-NACL 1000-0.7 MG/100ML-% IV SOLN: 1000.0000 mg | Freq: Once | INTRAVENOUS | Status: AC

## 2022-12-20 MED ORDER — LACTATED RINGERS IV SOLN
500.0000 mL | Freq: Once | INTRAVENOUS | Status: AC
  Administered 2022-12-20: 500 mL via INTRAVENOUS

## 2022-12-20 MED ORDER — OXYTOCIN-SODIUM CHLORIDE 30-0.9 UT/500ML-% IV SOLN
1.0000 m[IU]/min | INTRAVENOUS | Status: DC
  Administered 2022-12-20: 2 m[IU]/min via INTRAVENOUS
  Filled 2022-12-20: qty 500

## 2022-12-20 MED ORDER — LIDOCAINE HCL (PF) 1 % IJ SOLN
INTRAMUSCULAR | Status: DC | PRN
  Administered 2022-12-20: 5 mL via EPIDURAL
  Administered 2022-12-20: 3 mL via EPIDURAL

## 2022-12-20 MED ORDER — BENZOCAINE-MENTHOL 20-0.5 % EX AERO
1.0000 | INHALATION_SPRAY | CUTANEOUS | Status: DC | PRN
  Administered 2022-12-21 (×2): 1 via TOPICAL
  Filled 2022-12-20 (×2): qty 56

## 2022-12-20 MED ORDER — WITCH HAZEL-GLYCERIN EX PADS: 1.0000 | MEDICATED_PAD | CUTANEOUS | Status: DC | PRN

## 2022-12-20 MED ORDER — ONDANSETRON HCL 4 MG/2ML IJ SOLN
4.0000 mg | Freq: Four times a day (QID) | INTRAMUSCULAR | Status: DC | PRN
  Administered 2022-12-20: 4 mg via INTRAVENOUS
  Filled 2022-12-20: qty 2

## 2022-12-20 MED ORDER — IBUPROFEN 600 MG PO TABS
600.0000 mg | ORAL_TABLET | Freq: Four times a day (QID) | ORAL | Status: DC
  Administered 2022-12-20 – 2022-12-22 (×7): 600 mg via ORAL
  Filled 2022-12-20 (×7): qty 1

## 2022-12-20 MED ORDER — EPHEDRINE 5 MG/ML INJ: 10.0000 mg | INTRAVENOUS | Status: DC | PRN

## 2022-12-20 MED ORDER — LIDOCAINE HCL (PF) 1 % IJ SOLN: 30.0000 mL | INTRAMUSCULAR | Status: DC | PRN

## 2022-12-20 MED ORDER — OXYCODONE-ACETAMINOPHEN 5-325 MG PO TABS
2.0000 | ORAL_TABLET | ORAL | Status: DC | PRN
  Administered 2022-12-20: 2 via ORAL
  Filled 2022-12-20: qty 2

## 2022-12-20 MED ORDER — OXYTOCIN-SODIUM CHLORIDE 30-0.9 UT/500ML-% IV SOLN: 2.5000 [IU]/h | INTRAVENOUS | Status: DC

## 2022-12-20 MED ORDER — PHENYLEPHRINE 80 MCG/ML (10ML) SYRINGE FOR IV PUSH (FOR BLOOD PRESSURE SUPPORT): 80.0000 ug | PREFILLED_SYRINGE | INTRAVENOUS | Status: DC | PRN

## 2022-12-20 MED ORDER — ONDANSETRON HCL 4 MG/2ML IJ SOLN: 4.0000 mg | INTRAMUSCULAR | Status: DC | PRN

## 2022-12-20 MED ORDER — ONDANSETRON HCL 4 MG PO TABS: 4.0000 mg | ORAL_TABLET | ORAL | Status: DC | PRN

## 2022-12-20 MED ORDER — FENTANYL CITRATE (PF) 100 MCG/2ML IJ SOLN: 50.0000 ug | INTRAMUSCULAR | Status: DC | PRN

## 2022-12-20 MED ORDER — TRANEXAMIC ACID-NACL 1000-0.7 MG/100ML-% IV SOLN
INTRAVENOUS | Status: AC
  Administered 2022-12-20: 1000 mg via INTRAVENOUS
  Filled 2022-12-20: qty 100

## 2022-12-20 MED ORDER — COCONUT OIL OIL: 1.0000 | TOPICAL_OIL | Status: DC | PRN

## 2022-12-20 MED ORDER — FENTANYL-BUPIVACAINE-NACL 0.5-0.125-0.9 MG/250ML-% EP SOLN
12.0000 mL/h | EPIDURAL | Status: DC | PRN
  Administered 2022-12-20: 12 mL/h via EPIDURAL
  Filled 2022-12-20: qty 250

## 2022-12-20 MED ORDER — TETANUS-DIPHTH-ACELL PERTUSSIS 5-2.5-18.5 LF-MCG/0.5 IM SUSY
0.5000 mL | PREFILLED_SYRINGE | Freq: Once | INTRAMUSCULAR | Status: AC
  Administered 2022-12-22: 0.5 mL via INTRAMUSCULAR
  Filled 2022-12-20: qty 0.5

## 2022-12-20 MED ORDER — ACETAMINOPHEN 325 MG PO TABS
650.0000 mg | ORAL_TABLET | ORAL | Status: DC | PRN
  Administered 2022-12-21 – 2022-12-22 (×6): 650 mg via ORAL
  Filled 2022-12-20 (×6): qty 2

## 2022-12-20 MED ORDER — ACETAMINOPHEN 325 MG PO TABS: 650.0000 mg | ORAL_TABLET | ORAL | Status: DC | PRN

## 2022-12-20 MED ORDER — DIBUCAINE (PERIANAL) 1 % EX OINT: 1.0000 | TOPICAL_OINTMENT | CUTANEOUS | Status: DC | PRN

## 2022-12-20 MED ORDER — PRENATAL MULTIVITAMIN CH
1.0000 | ORAL_TABLET | Freq: Every day | ORAL | Status: DC
  Administered 2022-12-21 – 2022-12-22 (×2): 1 via ORAL
  Filled 2022-12-20 (×2): qty 1

## 2022-12-20 MED ORDER — DIPHENHYDRAMINE HCL 25 MG PO CAPS: 25.0000 mg | ORAL_CAPSULE | Freq: Four times a day (QID) | ORAL | Status: DC | PRN

## 2022-12-20 MED ORDER — SOD CITRATE-CITRIC ACID 500-334 MG/5ML PO SOLN: 30.0000 mL | ORAL | Status: DC | PRN

## 2022-12-20 MED ORDER — LACTATED RINGERS IV SOLN
500.0000 mL | INTRAVENOUS | Status: DC | PRN
  Administered 2022-12-20: 1000 mL via INTRAVENOUS

## 2022-12-20 NOTE — Progress Notes (Signed)
Labor Progress Note Heather Kelly is a 42 y.o. [id number] at [redacted]w[redacted]d presented for IOL.   S: Presented to patient room due to fetal HR decel.   O:  BP (!) 118/47   Pulse 80   Temp 99.6 F (37.6 C) (Axillary)   Resp 18   Ht 5\' 9"  (1.753 m)   Wt 118.8 kg   LMP 03/13/2022   SpO2 97%   BMI 38.69 kg/m  EFM: 150bpm/moderate/+accels, prolonged decel to 80s followed by several early decels  CVE: Dilation: 9 Effacement (%): 90 Cervical Position: Middle Station: 0 Exam by:: autry-lott   A&P: 42 y.o. [id number] [redacted]w[redacted]d here for IOL 2/2 NRNST (late decels). TOLAC, C/S x2.  #Labor: Progressing quickly.  #Pain: Epidural #FWB: Cat II improved to Cat I , rapidly progressing. IVF bolus x2 now.  #GBS positive  [name] Autry-Lott, DO 6:14 PM

## 2022-12-20 NOTE — Anesthesia Preprocedure Evaluation (Signed)
Anesthesia Evaluation  Patient identified by MRN, date of birth, ID band Patient awake    Reviewed: Allergy & Precautions, NPO status , Patient's Chart, lab work & pertinent test results  History of Anesthesia Complications Negative for: history of anesthetic complications  Airway Mallampati: III  TM Distance: >3 FB Neck ROM: Full    Dental  (+) Teeth Intact, Dental Advisory Given   Pulmonary sleep apnea , former smoker   Pulmonary exam normal breath sounds clear to auscultation       Cardiovascular negative cardio ROS  Rhythm:Regular Rate:Normal     Neuro/Psych    GI/Hepatic negative GI ROS, Neg liver ROS,,,  Endo/Other  Hypothyroidism    Renal/GU negative Renal ROS     Musculoskeletal   Abdominal  (+) + obese  Peds  Hematology Silent alpha thalassemia carrier   Anesthesia Other Findings   Reproductive/Obstetrics (+) Pregnancy                             Anesthesia Physical Anesthesia Plan  ASA: 2  Anesthesia Plan: Epidural   Post-op Pain Management:    Induction:   PONV Risk Score and Plan: 2  Airway Management Planned: Natural Airway  Additional Equipment:   Intra-op Plan:   Post-operative Plan:   Informed Consent: I have reviewed the patients History and Physical, chart, labs and discussed the procedure including the risks, benefits and alternatives for the proposed anesthesia with the patient or authorized representative who has indicated his/her understanding and acceptance.       Plan Discussed with: CRNA and Anesthesiologist  Anesthesia Plan Comments: (I have discussed risks of neuraxial anesthesia including but not limited to infection, bleeding, nerve injury, back pain, headache, seizures, and failure of block. Patient denies bleeding disorders and is not currently anticoagulated. Labs have been reviewed. Risks and benefits discussed. All patient's questions  answered.  )       Anesthesia Quick Evaluation

## 2022-12-20 NOTE — Progress Notes (Signed)
Labor Progress Note MAZIE FENCL is a 43 y.o. [id number] at [redacted]w[redacted]d presented for IOL.   S: Presented to patient room due to fetal HR decel.   O:  BP 128/60   Pulse 78   Temp 99 F (37.2 C) (Oral)   Resp 16   Ht 5\' 9"  (1.753 m)   Wt 118.8 kg   LMP 03/13/2022   SpO2 97%   BMI 38.69 kg/m  EFM: 140bpm/moderate/+accels, prolonged 3 minute decel  CVE: Dilation: 5 Effacement (%): 90 Cervical Position: Middle Station: -2 Exam by:: Autry-Lott CNM   A&P: 42 y.o. [id number] [redacted]w[redacted]d here for IOL 2/2 NRNST (late decels). TOLAC, C/S x2.  #Labor: On 6 of pit. Progressing slowly, evaluate for IUPC at next cervical exam.  #Pain: Epidural #FWB: Cat II, 3 min prolonged decel to 50s with good recovery. No evidence of cord compression. IVF bolus x1 now.  #GBS positive  Marica Trentham Autry-Lott, DO 3:50 PM

## 2022-12-20 NOTE — Progress Notes (Signed)
Labor Progress Note Heather Kelly is a 42 y.o. Y3K1601 at [redacted]w[redacted]d presented for IOL.   S: No acute  concerns. She is resting.   O:  BP (!) 108/58   Pulse 80   Temp 98.6 F (37 C) (Axillary)   Resp 16   Ht 5\' 9"  (1.753 m)   Wt 118.8 kg   LMP 03/13/2022   SpO2 97%   BMI 38.69 kg/m  EFM: 135bpm/moderate/+accels, no decels  CVE: Dilation: 4 Effacement (%): 90 Cervical Position: Middle Station: -2 Exam by:: Autry-lott   A&P: 42 y.o. [id number] [redacted]w[redacted]d here for IOL 2/2 NRNST (late decels). TOLAC, C/S x2.  #Labor: On 4 of pit. S/p AROM, clear to bloody fluid #Pain: Epidural #FWB: Cat I  #GBS positive  Nalin Mazzocco Autry-Lott, DO 12:18 PM

## 2022-12-20 NOTE — Anesthesia Procedure Notes (Signed)
Epidural Patient location during procedure: OB Start time: 12/20/2022 10:52 AM End time: 12/20/2022 10:57 AM  Staffing Anesthesiologist: Nilda Simmer, MD Performed: anesthesiologist   Preanesthetic Checklist Completed: patient identified, IV checked, site marked, risks and benefits discussed, surgical consent, monitors and equipment checked, pre-op evaluation and timeout performed  Epidural Patient position: sitting Prep: DuraPrep and site prepped and draped Patient monitoring: continuous pulse ox and blood pressure Approach: midline Location: L3-L4 Injection technique: LOR saline  Needle:  Needle type: Tuohy  Needle gauge: 17 G Needle length: 9 cm and 9 Needle insertion depth: 7 cm Catheter type: closed end flexible Catheter size: 19 Gauge Catheter at skin depth: 11 cm Test dose: negative  Assessment Events: blood not aspirated, injection not painful, no injection resistance, no paresthesia and negative IV test  Additional Notes The patient has requested an epidural for labor pain management. Risks and benefits including, but not limited to, infection, bleeding, local anesthetic toxicity, headache, hypotension, back pain, block failure, etc. were discussed with the patient. The patient expressed understanding and consented to the procedure. I confirmed that the patient has no bleeding disorders and is not taking blood thinners. I confirmed the patient's last platelet count with the nurse. A time-out was performed immediately prior to the procedure. Please see nursing documentation for vital signs. Sterile technique was used throughout the whole procedure. Once LOR achieved, the epidural catheter threaded easily without resistance. Aspiration of the catheter was negative for blood and CSF. The epidural was dosed slowly and an infusion was started.  -11- attempt(s)Reason for block:procedure for pain

## 2022-12-20 NOTE — Progress Notes (Signed)
Labor Progress Note Heather Kelly is a 42 y.o. A4Z6606 at [redacted]w[redacted]d presented for IOL.   S: No acute  concerns. She is resting.   O:  BP (!) 112/46   Pulse 76   Temp 98.6 F (37 C) (Axillary)   Resp 16   Ht 5\' 9"  (1.753 m)   Wt 118.8 kg   LMP 03/13/2022   SpO2 100%   BMI 38.69 kg/m  EFM: 135bpm/moderate/+accels, no decels  CVE: Dilation: 3 Effacement (%): 70 Cervical Position: Middle Station: -2 Exam by:: fuller, B RN   A&P: 42 y.o. [id number] [redacted]w[redacted]d here for IOL 2/2 NRNST (late decels). TOLAC, C/S x2.  #Labor: On 4 of pit. Plan to AROM at next cervical exam after an epidural #Pain: Planning for epidural #FWB: Cat I  #GBS positive    [name], DO 9:46 AM

## 2022-12-20 NOTE — Discharge Summary (Signed)
Postpartum Discharge Summary  Date of Service updated***     Patient Name: Heather Kelly DOB: 1981-03-15 MRN: [id number]  Date of admission: 12/19/2022 Delivery date:12/20/2022  Delivering provider: Gerlene Fee  Date of discharge: 12/20/2022  Admitting diagnosis: Indication for care in labor or delivery [O75.9] Intrauterine pregnancy: [redacted]w[redacted]d     Secondary diagnosis:  Principal Problem:   Indication for care in labor or delivery Active Problems:   AMA (advanced maternal age) multigravida 35+   History of 2 cesarean deliveries, currently pregnant   Hypothyroidism affecting pregnancy   Alpha thalassemia silent carrier   GBS (group B Streptococcus carrier), +RV culture, currently pregnant  Additional problems: ***    Discharge diagnosis: {DX.:23714}                                              Post partum procedures:{Postpartum procedures:23558} Augmentation: AROM, Pitocin, and IP Foley Complications: {OB Labor/Delivery Complications:20784}  Hospital course: {Courses:23701}  Magnesium Sulfate received: {Mag received:30440022} BMZ received: {BMZ received:30440023} Rhophylac:{Rhophylac received:30440032} MMR:{MMR:30440033} T-DaP:{Tdap:23962} Flu: {NWG:95621} Transfusion:{Transfusion received:30440034}  Physical exam  Vitals:   12/20/22 1952 12/20/22 2001 12/20/22 2016 12/20/22 2031  BP: (!) 127/107 (!) 132/56 124/82 (!) 114/43  Pulse: (!) 188 77  79  Resp:      Temp:      TempSrc:      SpO2:      Weight:      Height:       General: {Exam; general:21111117} Lochia: {Desc; appropriate/inappropriate:30686::"appropriate"} Uterine Fundus: {Desc; firm/soft:30687} Incision: {Exam; incision:21111123} DVT Evaluation: {Exam; dvt:2111122} Labs: Lab Results  Component Value Date   WBC 6.9 12/19/2022   HGB 11.5 (L) 12/19/2022   HCT 37.6 12/19/2022   MCV 85.8 12/19/2022   PLT 312 12/19/2022      Latest Ref Rng & Units 06/05/2022   11:24 AM  CMP  Glucose 70 - 99  mg/dL 79   BUN 6 - 20 mg/dL 9   Creatinine 0.57 - 1.00 mg/dL 0.63   Sodium 134 - 144 mmol/L 136   Potassium 3.5 - 5.2 mmol/L 4.3   Chloride 96 - 106 mmol/L 101   CO2 20 - 29 mmol/L 22   Calcium 8.7 - 10.2 mg/dL 9.4   Total Protein 6.0 - 8.5 g/dL 7.3   Total Bilirubin 0.0 - 1.2 mg/dL 0.3   Alkaline Phos 44 - 121 IU/L 54   AST 0 - 40 IU/L 20   ALT 0 - 32 IU/L 16    Edinburgh Score:     No data to display           After visit meds:  Allergies as of 12/20/2022   No Known Allergies   Med Rec must be completed prior to using this [facility]***        Discharge home in stable condition Infant Feeding: {Baby feeding:23562} Infant Disposition:{CHL IP OB HOME WITH HYQMVH:84696} Discharge instruction: per After Visit Summary and Postpartum booklet. Activity: Advance as tolerated. Pelvic rest for 6 weeks.  Diet: {OB EXBM:84132440} Future Appointments:No future appointments. Follow up Visit:   Please schedule this patient for a {Visit type:23955} postpartum visit in {Postpartum visit:23953} with the following provider: {Provider type:23954}. Additional Postpartum F/U:{PP Procedure:23957}  {Risk NUUVO:53664} pregnancy complicated by: {QIHKVQQVZDGL:87564} Delivery mode:  VBAC, Spontaneous  Anticipated Birth Control:  {Birth Control:23956}   12/20/2022 Tjay Velazquez Autry-Lott, DO

## 2022-12-21 LAB — CBC
HCT: 30.7 % — ABNORMAL LOW (ref 36.0–46.0)
Hemoglobin: 9.9 g/dL — ABNORMAL LOW (ref 12.0–15.0)
MCH: 27 pg (ref 26.0–34.0)
MCHC: 32.2 g/dL (ref 30.0–36.0)
MCV: 83.7 fL (ref 80.0–100.0)
Platelets: 254 10*3/uL (ref 150–400)
RBC: 3.67 MIL/uL — ABNORMAL LOW (ref 3.87–5.11)
RDW: 14.3 % (ref 11.5–15.5)
WBC: 10.2 10*3/uL (ref 4.0–10.5)
nRBC: 0 % (ref 0.0–0.2)

## 2022-12-21 MED ORDER — RHO D IMMUNE GLOBULIN 1500 UNIT/2ML IJ SOSY
300.0000 ug | PREFILLED_SYRINGE | Freq: Once | INTRAMUSCULAR | Status: DC
  Filled 2022-12-21: qty 2

## 2022-12-21 MED ORDER — SODIUM CHLORIDE 0.9 % IV SOLN
500.0000 mg | Freq: Once | INTRAVENOUS | Status: AC
  Administered 2022-12-21: 500 mg via INTRAVENOUS
  Filled 2022-12-21: qty 25

## 2022-12-21 MED ORDER — RHO D IMMUNE GLOBULIN 1500 UNIT/2ML IJ SOSY
300.0000 ug | PREFILLED_SYRINGE | Freq: Once | INTRAMUSCULAR | Status: AC
  Administered 2022-12-21: 300 ug via INTRAVENOUS
  Filled 2022-12-21: qty 2

## 2022-12-21 NOTE — Progress Notes (Signed)
Circumcision Consent   -Circumcision procedure details discussed including usage of local anesthesia, infant soother, and removal and disposal of foreskin. -Risks and benefits of procedure were reviewed including, but not limited to:  *Benefits include reduction in the rates of urinary tract infection (UTI), penile cancer, some sexually transmitted infections, penile inflammatory, and retractile disorders, as well as easier hygiene.   *Risks include bleeding, infection, injury of glans which may lead to need for additional surgery, penile deformity, or urinary tract issues, unsatisfactory cosmetic appearance and other potential complications related to the procedure.   -Informed that procedure will not be performed if provider deems inappropriate d/t penile size, noted deformity, or unsatisfactory pediatric evaluation. -It was emphasized that this is an elective procedure.   -Post circumcision care discussed.   Patient wants to proceed with circumcision. Informed that team would be notified and complete prior to discharge and upon satisfactory pediatric evaluation of infant.    Eldo Umanzor Q Mercado-Ortiz, DO FMOB Fellow, Faculty practice [place], [facility]     

## 2022-12-21 NOTE — Lactation Note (Signed)
This note was copied from a baby's chart. Lactation Consultation Note  Patient Name: Heather Kelly Date: 12/21/2022 Reason for consult: Initial assessment;Term;Maternal endocrine disorder Age:40 hours Mom states the baby has wanted to BF almost constantly. Explained to mom LC has noticed w/multiple babies out and BF them for a long time babies tend to cluster feed from day 1. Mom requested DEBP. [place] set up DEBP. Mom BF baby. Will pump later. Newborn feeding habits, STS, I&O, pumping, positioning, support supply and demand reviewed. Mom encouraged to feed baby 8-12 times/24 hours and with feeding cues. Suggested occasionally massage or compress breast during feeding will help fill baby up needed. Noticed some transfer w/feedings. Encouraged mom to call for questions or assistance. Maternal Data Does the patient have breastfeeding experience prior to this delivery?: Yes How long did the patient breastfeed?: 8 weeks to her now 40 yr old and 18 months to her now 42 yr old.  Feeding    LATCH Score Latch: Grasps breast easily, tongue down, lips flanged, rhythmical sucking.  Audible Swallowing: A few with stimulation  Type of Nipple: Everted at rest and after stimulation  Comfort (Breast/Nipple): Soft / non-tender  Hold (Positioning): Assistance needed to correctly position infant at breast and maintain latch.  LATCH Score: 8   Lactation Tools Discussed/Used Tools: Pump;Flanges Flange Size: 21 Breast pump type: Double-Electric Breast Pump Pump Education: Setup, frequency, and cleaning;Milk Storage Reason for Pumping: mom's request for supplementation Pumping frequency: q3hr  Interventions Interventions: Breast feeding basics reviewed;Adjust position;DEBP;Assisted with latch;Support pillows;Skin to skin;Position options;Breast massage;Hand express;LC Services brochure;Breast compression  Discharge    Consult Status Consult Status: Follow-up Date: 12/22/22 Follow-up  type: In-patient    Theodoro Kalata 12/21/2022, 6:34 AM

## 2022-12-21 NOTE — Lactation Note (Signed)
This note was copied from a baby's chart. Lactation Consultation Note  Patient Name: Heather Kelly IOEVO'J Date: 12/21/2022 Reason for consult: Follow-up assessment;Breastfeeding assistance;Mother's request;Term Age:39 hours  Mother requested [place] assistance with latch. Mother is an experienced breastfeeding mother, almost 13 years ago, when she breast fed for 18 months. She reports baby has been sleepy and uninterested with feeding. Baby awakened by checking diaper, he cued and with assist, latched well to the breast. Breastfeeding basics, pillow support and review of techniques for obtaining a deep latch with alternate massage, as needed, to keep baby engaged in the feeding.  Mother instructed to call for help if needed.    LATCH Score Latch: Grasps breast easily, tongue down, lips flanged, rhythmical sucking.  Audible Swallowing: A few with stimulation  Type of Nipple: Everted at rest and after stimulation  Comfort (Breast/Nipple): Soft / non-tender  Hold (Positioning): Assistance needed to correctly position infant at breast and maintain latch.  LATCH Score: 8  Interventions Interventions: Breast feeding basics reviewed;Assisted with latch;Skin to skin;Breast massage;Breast compression;Support pillows;Position options;Education  Consult Status Consult Status: Follow-up Date: 12/22/22 Follow-up type: In-patient    Heather Kelly M 12/21/2022, 11:09 AM

## 2022-12-21 NOTE — Plan of Care (Signed)

## 2022-12-21 NOTE — Anesthesia Postprocedure Evaluation (Signed)
Anesthesia Post Note  Patient: Heather Kelly  Procedure(s) Performed: AN AD HOC LABOR EPIDURAL     Patient location during evaluation: Mother Baby Anesthesia Type: Epidural Level of consciousness: awake and alert Pain management: pain level controlled Vital Signs Assessment: post-procedure vital signs reviewed and stable Respiratory status: spontaneous breathing, nonlabored ventilation and respiratory function stable Cardiovascular status: stable Postop Assessment: no headache, no backache and epidural receding Anesthetic complications: no   No notable events documented.  Last Vitals:  Vitals:   12/21/22 0740 12/21/22 1215  BP: 106/63 110/65  Pulse: 73 98  Resp: 16 16  Temp: 36.8 C 36.7 C  SpO2: 99% 99%    Last Pain:  Vitals:   12/21/22 1300  TempSrc:   PainSc: 7    Pain Goal: Patients Stated Pain Goal: 0 (12/20/22 1115)                 Ailene Ards

## 2022-12-21 NOTE — Progress Notes (Signed)
POSTPARTUM PROGRESS NOTE  Post Partum Day 1  Subjective:  Heather Kelly is a 42 y.o. [id number] s/p VBAC at [redacted]w[redacted]d.  She reports she is doing well. No acute events overnight. She denies any problems with ambulating, voiding or po intake. Denies nausea or vomiting.  Pain is well controlled.  Lochia is adequate.  Objective: Blood pressure (!) 114/50, pulse 68, temperature 98.4 F (36.9 C), temperature source Oral, resp. rate 18, height 5\' 9"  (1.753 m), weight 118.8 kg, last menstrual period 03/13/2022, SpO2 97 %, unknown if currently breastfeeding.  Physical Exam:  General: alert, cooperative and no distress Chest: no respiratory distress Heart:regular rate, distal pulses intact Uterine Fundus: firm, appropriately tender DVT Evaluation: No calf swelling or tenderness Extremities: mild edema Skin: warm, dry  Recent Labs    12/19/22 1658 12/21/22 0523  HGB 11.5* 9.9*  HCT 37.6 30.7*    Assessment/Plan: Heather Kelly is a 42 y.o. [id number] s/p VBAC at [redacted]w[redacted]d   PPD#1  - Doing well  Routine postpartum care Anemia- IV Fe Contraception: still unsure Feeding: Breast Dispo: Plan for discharge in the next 24 h.   LOS: 2 days   Shelda Pal, DO OB Fellow  12/21/2022, 7:49 AM

## 2022-12-22 LAB — RH IG WORKUP (INCLUDES ABO/RH)
Fetal Screen: NEGATIVE
Gestational Age(Wks): 40
Unit division: 0
Unit division: 0

## 2022-12-22 MED ORDER — LEVOTHYROXINE SODIUM 75 MCG PO TABS: 75.0000 ug | ORAL_TABLET | Freq: Every day | ORAL | Status: DC

## 2022-12-22 MED ORDER — IBUPROFEN 600 MG PO TABS: 600.0000 mg | ORAL_TABLET | Freq: Four times a day (QID) | ORAL | 0 refills | Status: DC

## 2022-12-22 MED ORDER — MEASLES, MUMPS & RUBELLA VAC IJ SOLR
0.5000 mL | Freq: Once | INTRAMUSCULAR | Status: AC
  Administered 2022-12-22: 0.5 mL via SUBCUTANEOUS

## 2022-12-22 MED ORDER — ACETAMINOPHEN 325 MG PO TABS: 650.0000 mg | ORAL_TABLET | ORAL | 0 refills | Status: DC | PRN

## 2022-12-22 NOTE — Lactation Note (Signed)
This note was copied from a baby's chart. Lactation Consultation Note  Patient Name: Heather Kelly AYTKZ'S Date: 12/22/2022 Reason for consult: Follow-up assessment;Term Age:40 hours   P3: Term infant at 40+2 weeks Feeding preference: Breast Weight loss:  4%  Baby becoming fussy when I arrived.  Mother reports that he has been latching/feeding well and is now cluster feeding.  Last LATCH score was an 8.  Mother latched independently and baby actively sucking; mother denies discomfort with feeding.  Baby has a wide gape and flanged lips; multiple voids/stools.  Mother has no questions/concerns related to breast feeding.  She has our op phone number for any questions after discharge.   Maternal Data    Feeding Mother's Current Feeding Choice: Breast Milk  LATCH Score                    Lactation Tools Discussed/Used    Interventions    Discharge Discharge Education: Engorgement and breast care Pump: Personal  Consult Status Consult Status: Complete Date: 12/22/22 Follow-up type: Call as needed    [name] 12/22/2022, 10:45 AM

## 2022-12-22 NOTE — Discharge Instructions (Signed)
Because we gave you Iron IV you do not need to take oral iron at this time.

## 2022-12-24 LAB — SURGICAL PATHOLOGY

## 2022-12-28 ENCOUNTER — Telehealth (HOSPITAL_COMMUNITY): Payer: Self-pay

## 2022-12-28 NOTE — Telephone Encounter (Signed)
Patient did not answer phone call. Voicemail left for patient.   Sharyn Lull [facility] 12/28/22,1547

## 2023-01-23 ENCOUNTER — Other Ambulatory Visit: Payer: Self-pay

## 2023-01-23 NOTE — Progress Notes (Signed)
Breast pump rx faxed to [facility] 

## 2023-02-06 ENCOUNTER — Ambulatory Visit: Payer: Medicaid Other | Admitting: Obstetrics and Gynecology

## 2023-02-10 ENCOUNTER — Ambulatory Visit (INDEPENDENT_AMBULATORY_CARE_PROVIDER_SITE_OTHER): Payer: Medicaid Other | Admitting: Obstetrics and Gynecology

## 2023-02-10 ENCOUNTER — Encounter: Payer: Self-pay | Admitting: Obstetrics and Gynecology

## 2023-02-10 VITALS — BP 117/80 | HR 81 | Wt 236.9 lb

## 2023-02-10 DIAGNOSIS — D563 Thalassemia minor: Secondary | ICD-10-CM | POA: Diagnosis not present

## 2023-02-10 DIAGNOSIS — O0990 Supervision of high risk pregnancy, unspecified, unspecified trimester: Secondary | ICD-10-CM

## 2023-02-10 MED ORDER — PRENATE MINI 18-0.6-0.4-350 MG PO CAPS: 1.0000 | ORAL_CAPSULE | Freq: Every evening | ORAL | 3 refills | Status: DC

## 2023-02-10 NOTE — Progress Notes (Signed)
[place] Partum Visit Note  Heather Kelly is a 42 y.o. [phone] female who presents for a postpartum visit. She is 7 weeks postpartum following a vaginal delivery.  I have fully reviewed the prenatal and intrapartum course. The delivery was at 40.2 gestational weeks.  Anesthesia: epidural. Postpartum course has been going well. Baby is doing well. Baby is feeding by both breast and bottle - Similac Advance. Bleeding no bleeding. Bowel function is normal. Bladder function is normal. Patient is not sexually active. Contraception method is none. Postpartum depression screening: negative. Pt requesting refill on prenatal.   Doing well overall. Feels like she might still be anemic.  The pregnancy intention screening data noted above was reviewed. Potential methods of contraception were discussed. The patient elected to proceed with Female Condom.   Edinburgh Postnatal Depression Scale - 02/10/23 1326       Edinburgh Postnatal Depression Scale:  In the Past 7 Days   I have been able to laugh and see the funny side of things. 0    I have looked forward with enjoyment to things. 0    I have blamed myself unnecessarily when things went wrong. 0    I have been anxious or worried for no good reason. 0    I have felt scared or panicky for no good reason. 0    Things have been getting on top of me. 0    I have been so unhappy that I have had difficulty sleeping. 0    I have felt sad or miserable. 0    I have been so unhappy that I have been crying. 0    The thought of harming myself has occurred to me. 0    Edinburgh Postnatal Depression Scale Total 0             Health Maintenance Due  Topic Date Due   COVID-19 Vaccine (1) Never done   INFLUENZA VACCINE  Never done    The following portions of the patient's history were reviewed and updated as appropriate: allergies, current medications, past family history, past medical history, past social history, past surgical history, and problem  list.  Review of Systems Pertinent items are noted in HPI.  Objective:  BP 117/80   Pulse 81   Wt 236 lb 14.4 oz (107.5 kg)   LMP 03/13/2022   Breastfeeding Yes   BMI 34.98 kg/m    General:  alert, cooperative, no distress, and pale   Breasts:  not indicated  Lungs: Normal respiratory effort  Heart:  Normal rate  Abdomen: soft, non-tender; bowel sounds normal; no masses,  no organomegaly   GU exam:  normal       Assessment:   Postpartum exam.   Plan:   Essential components of care per ACOG recommendations:  1.  Mood and well being: Patient with negative depression screening today. Reviewed local resources for support.  - Patient tobacco use? No.   - hx of drug use? No.    2. Infant care and feeding:  -Patient currently breastmilk feeding? Yes  -Social determinants of health (SDOH) reviewed in EPIC. No concerns  3. Sexuality, contraception and birth spacing - Patient does want a pregnancy in the next year.  Desired family size is 4 children.  - Reviewed reproductive life planning. Reviewed contraceptive methods based on pt preferences and effectiveness.  Patient desired Female Condom today.   - Discussed birth spacing of 18 months  4. Sleep and fatigue -Encouraged family/partner/community support  of 4 hrs of uninterrupted sleep to help with mood and fatigue  5. Physical Recovery  - Discussed patients delivery and complications. She describes her labor as good. - Patient had a Vaginal, no problems at delivery. VBAC. Patient had a 1st degree laceration. Perineal healing reviewed. Patient expressed understanding - Patient has urinary incontinence? No. - Patient is safe to resume physical and sexual activity  6.  Health Maintenance - HM due items addressed Yes - Last pap smear  Diagnosis  Date Value Ref Range Status  06/05/2022   Final   - Negative for intraepithelial lesion or malignancy (NILM)   Pap smear not done at today's visit.  -Breast Cancer screening  indicated? No.   7. Chronic Disease/Pregnancy Condition follow up: Hypothyroidism - PCP follow up  Inez Catalina, [place] for [facility], [place]

## 2023-02-11 LAB — CBC
Hematocrit: 40.9 % (ref 34.0–46.6)
Hemoglobin: 13.4 g/dL (ref 11.1–15.9)
MCH: 26.1 pg — ABNORMAL LOW (ref 26.6–33.0)
MCHC: 32.8 g/dL (ref 31.5–35.7)
MCV: 80 fL (ref 79–97)
Platelets: 385 10*3/uL (ref 150–450)
RBC: 5.13 x10E6/uL (ref 3.77–5.28)
RDW: 13.3 % (ref 11.7–15.4)
WBC: 4.5 10*3/uL (ref 3.4–10.8)

## 2024-03-23 ENCOUNTER — Other Ambulatory Visit: Payer: Self-pay | Admitting: Physician Assistant

## 2024-03-23 DIAGNOSIS — Z1231 Encounter for screening mammogram for malignant neoplasm of breast: Secondary | ICD-10-CM

## 2024-04-08 ENCOUNTER — Ambulatory Visit
Admission: RE | Admit: 2024-04-08 | Discharge: 2024-04-08 | Disposition: A | Discharge status: Home / Self Care | Source: Ambulatory Visit | Attending: Physician Assistant | Admitting: Physician Assistant

## 2024-04-08 DIAGNOSIS — Z1231 Encounter for screening mammogram for malignant neoplasm of breast: Secondary | ICD-10-CM

## 2024-04-16 ENCOUNTER — Other Ambulatory Visit (HOSPITAL_COMMUNITY)
Admission: RE | Admit: 2024-04-16 | Discharge: 2024-04-16 | Disposition: A | Discharge status: Home / Self Care | Source: Ambulatory Visit | Attending: Obstetrics and Gynecology | Admitting: Obstetrics and Gynecology

## 2024-04-16 ENCOUNTER — Encounter: Payer: Self-pay | Admitting: Obstetrics and Gynecology

## 2024-04-16 ENCOUNTER — Ambulatory Visit (INDEPENDENT_AMBULATORY_CARE_PROVIDER_SITE_OTHER): Admitting: Obstetrics and Gynecology

## 2024-04-16 VITALS — BP 117/78 | HR 76 | Ht 69.0 in | Wt 237.4 lb

## 2024-04-16 DIAGNOSIS — Z1339 Encounter for screening examination for other mental health and behavioral disorders: Secondary | ICD-10-CM

## 2024-04-16 DIAGNOSIS — Z3202 Encounter for pregnancy test, result negative: Secondary | ICD-10-CM

## 2024-04-16 DIAGNOSIS — Z01419 Encounter for gynecological examination (general) (routine) without abnormal findings: Secondary | ICD-10-CM | POA: Insufficient documentation

## 2024-04-16 DIAGNOSIS — Z23 Encounter for immunization: Secondary | ICD-10-CM | POA: Diagnosis not present

## 2024-04-16 LAB — POCT URINE PREGNANCY: Preg Test, Ur: NEGATIVE

## 2024-04-16 NOTE — Progress Notes (Signed)
 ANNUAL EXAM Patient name: Heather Kelly MRN [id number]  Date of birth: 1981-08-10 Chief Complaint:   Annual Exam  History of Present Illness:   Heather Kelly is a 43 y.o. [phone] with Patient's last menstrual period was 02/14/2023. being seen today for a routine annual exam.  Current complaints: Doing well overall.  Is hoping for another pregnancy in the fall.  Currently breastfeeding and has not resumed menstrual cycles. Her son is ~16 months now.   Is taking bariatric surgery vitamins with folic acid. Does have significant amount of vitamin A in it - approaching upper limit of safety for pregnancy.  Thinks she'll be done having children after next. Does not want tubal but not sure what she wants to do for contraception. Maybe partner will get vasectomy.  Notes some vaginal dryness.   The pregnancy intention screening data noted above was reviewed. Potential methods of contraception were discussed. The patient elected to proceed with No Contraception Precautions.   Last pap 06/05/22. Results were: NILM w/ HRHPV negative. 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 Had colpo/biopsies but did not require excisional procedure.  Last mammogram: 04/08/24. Results were: normal. Family h/o breast cancer: yes maternal grandmother, menopausal Last colonoscopy: n/a. Results were: N/A. Family h/o colorectal cancer: no HPV vaccine: no     04/16/2024   10:32 AM 10/03/2022    9:20 AM 05/17/2022   10:58 AM  Depression screen PHQ 2/9  Decreased Interest 0 0 0  Down, Depressed, Hopeless 0 0 0  PHQ - 2 Score 0 0 0  Altered sleeping 0 0 0  Tired, decreased energy 0 0 1  Change in appetite 0 1 0  Feeling bad or failure about yourself  0 0 0  Trouble concentrating 0  0  Moving slowly or fidgety/restless 0  0  Suicidal thoughts 0  0  PHQ-9 Score 0 1 1        04/16/2024   10:32 AM 05/17/2022   10:59 AM  GAD 7 : Generalized Anxiety Score  Nervous, Anxious, on Edge 0 1  Control/stop  worrying 0 1  Worry too much - different things 0 0  Trouble relaxing 0 0  Restless 0 0  Easily annoyed or irritable 0 0  Afraid - awful might happen 0 1  Total GAD 7 Score 0 3     Review of Systems:   Pertinent items are noted in HPI Denies any headaches, blurred vision, fatigue, shortness of breath, chest pain, abdominal pain, abnormal vaginal discharge/itching/odor/irritation, problems with periods, bowel movements, urination, or intercourse unless otherwise stated above. Pertinent History Reviewed:  Reviewed past medical,surgical, social and family history.  Reviewed problem list, medications and allergies. Physical Assessment:   Vitals:   04/16/24 1029  BP: 117/78  Pulse: 76  Weight: 237 lb 6.4 oz (107.7 kg)  Height: 5\' 9"  (1.753 m)  Body mass index is 35.06 kg/m.        Physical Examination:   General appearance - well appearing, and in no distress  Mental status - alert, oriented to person, place, and time  Chest - respiratory effort normal  Heart - normal peripheral perfusion  Breasts - breasts appear normal, no suspicious masses, no skin or nipple changes or axillary nodes  Abdomen - soft, nontender, nondistended, no masses or organomegaly  Pelvic - VULVA: normal appearing vulva with no masses, tenderness or lesions  Chaperone present for exam  No results found for this or any  previous visit (from the past 24 hours).  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  Discussed increased risk of SAB/early pregnancy loss, PTL/PTB, genetic anomalies, IUFD, growth restriction, multiple gestation, placental issues, preeclampsia, cardiovascular disease, overall maternal morbidity/mortality  - Discussed strategies to mitigate these risks including ldASA and enhanced antenatal testing  3) Lactational amenorrhea Discussed that it is possible to ovulate and get pregnant before getting first period if having unprotected IC If no period at the 52 month mark, may want to rule out other causes of amenorrhea  Labs/procedures today:   Orders Placed This Encounter  Procedures   HPV 9-valent vaccine,Recombinat   RPR   HIV antibody (with reflex)   Hepatitis C Antibody   Hepatitis B Surface AntiGEN   POCT urine pregnancy   Meds: No orders of the defined types were placed in this encounter.  Follow-up: Return in about 2 months (around 06/16/2024) for HPV shot #2 - RN visit.  Izell Marsh, MD 04/16/2024 3:23 PM

## 2024-04-17 ENCOUNTER — Encounter: Payer: Self-pay | Admitting: Obstetrics and Gynecology

## 2024-04-17 LAB — HEPATITIS B SURFACE ANTIGEN: Hepatitis B Surface Ag: NEGATIVE

## 2024-04-17 LAB — HEPATITIS C ANTIBODY: Hep C Virus Ab: NONREACTIVE

## 2024-04-17 LAB — RPR: RPR Ser Ql: NONREACTIVE

## 2024-04-17 LAB — HIV ANTIBODY (ROUTINE TESTING W REFLEX): HIV Screen 4th Generation wRfx: NONREACTIVE

## 2024-04-19 LAB — CERVICOVAGINAL ANCILLARY ONLY
Bacterial Vaginitis (gardnerella): POSITIVE — AB
Candida Glabrata: NEGATIVE
Candida Vaginitis: NEGATIVE
Chlamydia: NEGATIVE
Comment: NEGATIVE
Comment: NEGATIVE
Comment: NEGATIVE
Comment: NEGATIVE
Comment: NEGATIVE
Comment: NORMAL
Neisseria Gonorrhea: NEGATIVE
Trichomonas: NEGATIVE

## 2024-04-20 ENCOUNTER — Encounter: Payer: Self-pay | Admitting: Obstetrics and Gynecology

## 2024-04-20 MED ORDER — METRONIDAZOLE 0.75 % VA GEL: 1.0000 | Freq: Every day | VAGINAL | 0 refills | Status: DC

## 2024-04-20 NOTE — Addendum Note (Signed)
 Addended by: Loralyn Rochester on: 04/20/2024 01:47 AM   Modules accepted: Orders

## 2024-06-08 NOTE — Therapy (Unsigned)
 OUTPATIENT PHYSICAL THERAPY THORACOLUMBAR EVALUATION   Patient Name: Heather Kelly MRN: [id number] DOB:[birth year], 43 y.o., female Today's Date: 06/10/2024  END OF SESSION:  PT End of Session - 06/10/24 1222     Visit Number 1    Number of Visits 12    Date for PT Re-Evaluation 08/10/24    Authorization Type MCD    PT Start Time 1220    PT Stop Time 1300    PT Time Calculation (min) 40 min    Activity Tolerance Patient tolerated treatment well    Behavior During Therapy [facility] for tasks assessed/performed          Past Medical History:  Diagnosis Date   Hypothyroidism    Sleep apnea    Past Surgical History:  Procedure Laterality Date   BARIATRIC SURGERY  04/10/2021   CESAREAN SECTION  2011   CESAREAN SECTION  2007   Patient Active Problem List   Diagnosis Date Noted   OSA on CPAP 01/23/2021    PCP: Loreli Kins, MD   REFERRING PROVIDER: Rosalea Rosina SAILOR, PA  REFERRING DIAG: LOW BACK PAIN ONGOING AFTER PREGNANCY  Rationale for Evaluation and Treatment: Rehabilitation  THERAPY DIAG:  Other low back pain  ONSET DATE: chronic  SUBJECTIVE:                                                                                                                                                                                           SUBJECTIVE STATEMENT: Describes low back pain w/o radicular symptoms.  Symptoms aggravated by prolonged sitting/standing.  Feels better when moving as opposed being stationary  PERTINENT HISTORY:    PAIN:  Are you having pain? Yes: NPRS scale: 2-8/10 Pain location: low back Pain description: ache Aggravating factors: prolonged sitting/standing, bending and lifting Relieving factors: position changes  PRECAUTIONS: None  RED FLAGS: None   WEIGHT BEARING RESTRICTIONS: No  FALLS:  Has patient fallen in last 6 months? No  OCCUPATION: not working  PLOF: Independent  PATIENT GOALS: To manage my back pain  NEXT MD VISIT:  TBD  OBJECTIVE:  Note: Objective measures were completed at Evaluation unless otherwise noted.  DIAGNOSTIC FINDINGS:  IMPRESSION: 1. L5-S1 large right paracentral disc protrusion, which contacts both descending S1 nerve roots and causes mild spinal canal stenosis. 2. Mild bilateral hydronephrosis. 3. No acute fracture or traumatic listhesis in the thoracic or lumbar spine.     Electronically Signed   By: Donald Campion M.D.   On: 09/27/2022 19:12  PATIENT SURVEYS:  Modified Oswestry: 19/50 38% perceived disability  Interpretation of scores: Score Category Description  0-20% Minimal Disability The  patient can cope with most living activities. Usually no treatment is indicated apart from advice on lifting, sitting and exercise  21-40% Moderate Disability The patient experiences more pain and difficulty with sitting, lifting and standing. Travel and social life are more difficult and they may be disabled from work. Personal care, sexual activity and sleeping are not grossly affected, and the patient can usually be managed by conservative means  41-60% Severe Disability Pain remains the main problem in this group, but activities of daily living are affected. These patients require a detailed investigation  61-80% Crippled Back pain impinges on all aspects of the patient's life. Positive intervention is required  81-100% Bed-bound  These patients are either bed-bound or exaggerating their symptoms  Bluford FORBES Zoe DELENA Karon DELENA, et al. Surgery versus conservative management of stable thoracolumbar fracture: the PRESTO feasibility RCT. [place] ([place]): [employer]; 2021 Nov. [facility] Technology Assessment, No. 25.62.) Appendix 3, Oswestry Disability Index category descriptors. Available from: FindJewel[employer].cz  Minimally Clinically Important Difference (MCID) = 12.8%   MUSCLE LENGTH: Hamstrings: WNL Thomas test: tight quads L>R  POSTURE: No  Significant postural limitations  PALPATION: deferred  LUMBAR ROM:   AROM eval  Flexion 100%  Extension 100%  Right lateral flexion 100%  Left lateral flexion 100%  Right rotation   Left rotation    (Blank rows = not tested)  LOWER EXTREMITY ROM:   WNL  Active  Right eval Left eval  Hip flexion    Hip extension    Hip abduction    Hip adduction    Hip internal rotation    Hip external rotation    Knee flexion    Knee extension    Ankle dorsiflexion    Ankle plantarflexion    Ankle inversion    Ankle eversion     (Blank rows = not tested)  LOWER EXTREMITY MMT:    MMT Right eval Left eval  Hip flexion    Hip extension 4 4  Hip abduction    Hip adduction    Hip internal rotation    Hip external rotation    Knee flexion    Knee extension    Ankle dorsiflexion    Ankle plantarflexion    Ankle inversion    Ankle eversion     (Blank rows = not tested)  LUMBAR SPECIAL TESTS:  Straight leg raise test: Negative, Slump test: Negative, and FABER test: Negative  FUNCTIONAL TESTS:  5 times sit to stand: 4   GAIT: Distance walked: 31ft x2 Assistive device utilized: None Level of assistance: Complete Independence Comments: unremarkable  TREATMENT:                                                                                                                          [facility] Adult PT Treatment:  DATE: 06/10/24 Eval and HEP Self Care: Additional minutes spent for educating on updated Therapeutic Home Exercise Program as well as comparing current status to condition at start of symptoms. This included exercises focusing on stretching, strengthening, with focus on eccentric aspects. Long term goals include an improvement in range of motion, strength, endurance as well as avoiding reinjury. Patient's frequency would include in 1-2 times a day, 3-5 times a week for a duration of 6-12 weeks. Proper technique shown and discussed  handout in great detail. All questions were discussed and addressed.      PATIENT EDUCATION:  Education details: Discussed eval findings, rehab rationale and POC and patient is in agreement  Person educated: Patient Education method: Explanation and Handouts Education comprehension: verbalized understanding and needs further education  HOME EXERCISE PROGRAM: Access Code: [email or web address] URL: [email or web address] Date: 06/10/2024 Prepared by: Reyes Kohut  Exercises - Supine 90/90 Abdominal Bracing  - 1-2 x daily - 3 x weekly - 1 sets - 10 reps - 60s hold - Supine Posterior Pelvic Tilt  - 1-2 x daily - 5 x weekly - 1 sets - 10 reps - 3s hold - Supine March with Posterior Pelvic Tilt  - 1-2 x daily - 5 x weekly - 1 sets - 10 reps - Static Prone on Elbows  - 1-2 x daily - 5 x weekly - 1 sets - 1 reps - 2 min hold  ASSESSMENT:  CLINICAL IMPRESSION: Patient is a 43 y.o. 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  No radicular symptoms present, trunk ROM WNL.  Weakness identified in core and hip extensor muscles.  She is currently exercising at [employer] and enjoys Pilates.  Patient will attend PT 1x.week until confident in self management techniques.  OBJECTIVE IMPAIRMENTS: decreased activity tolerance, decreased knowledge of condition, decreased mobility, decreased strength, increased fascial restrictions, impaired perceived functional ability, improper body mechanics, obesity, and pain.   ACTIVITY LIMITATIONS: carrying, lifting, bending, sitting, standing, sleeping, and bed mobility  PERSONAL FACTORS: Age, Past/current experiences, and Time since onset of injury/illness/exacerbation are also affecting patient's functional outcome.   REHAB POTENTIAL: Fair based on chronicity  CLINICAL DECISION MAKING: Stable/uncomplicated  EVALUATION COMPLEXITY: Low   GOALS: Goals reviewed with  patient? No   SHORT TERM GOALS=LONG TERM GOALS: Target date: 08/05/2024    Patient will acknowledge 4/10 worst pain at least once during episode of care   Baseline: 8/10 Goal status: INITIAL  2.  Patient will increase 30s chair stand reps from 4 to 10 without arms to demonstrate and improved functional ability with less pain/difficulty as well as reduce fall risk.  Baseline: 4 Goal status: INITIAL  3.  Patient will score at least 11/50 on ODI to signify clinically meaningful improvement in functional abilities.   Baseline: 19/50 Goal status: INITIAL  4.  Patient to demonstrate independence in HEP  Baseline: [id number] Goal status: INITIAL  5.  Patient to demo a painfree bridge Baseline: Unable to bridge due to pain Goal status: INITIAL    PLAN:  PT FREQUENCY: 1x/week  PT DURATION: 6 weeks  PLANNED INTERVENTIONS: 97110-Therapeutic exercises, 97530- Therapeutic activity, 97112- Neuromuscular re-education, 97535- Self Care, and 02859- Manual therapy.  PLAN FOR NEXT SESSION: HEP review and update, manual techniques as appropriate, aerobic tasks, ROM and flexibility activities, strengthening and PREs, TPDN, gait and balance training as needed   For all possible CPT codes, reference the Planned Interventions line above.  Check all conditions that are expected to impact treatment: {Conditions expected to impact treatment:Structural or anatomic abnormalities and Current pregnancy or recent postpartum   If treatment provided at initial evaluation, no treatment charged due to lack of authorization.       Rimas Gilham M Akshath Mccarey, PT 06/10/2024, 12:49 PM

## 2024-06-10 ENCOUNTER — Ambulatory Visit: Attending: Physician Assistant

## 2024-06-10 ENCOUNTER — Other Ambulatory Visit: Payer: Self-pay

## 2024-06-10 DIAGNOSIS — M6281 Muscle weakness (generalized): Secondary | ICD-10-CM | POA: Insufficient documentation

## 2024-06-10 DIAGNOSIS — M5459 Other low back pain: Secondary | ICD-10-CM | POA: Insufficient documentation

## 2024-06-10 DIAGNOSIS — M5126 Other intervertebral disc displacement, lumbar region: Secondary | ICD-10-CM | POA: Insufficient documentation

## 2024-06-22 NOTE — Therapy (Deleted)
 OUTPATIENT PHYSICAL THERAPY THORACOLUMBAR EVALUATION   Patient Name: Heather Kelly MRN: [id number] DOB:[birth year], 43 y.o., female Today's Date: 06/22/2024  END OF SESSION:    Past Medical History:  Diagnosis Date   Hypothyroidism    Sleep apnea    Past Surgical History:  Procedure Laterality Date   BARIATRIC SURGERY  04/10/2021   CESAREAN SECTION  2011   CESAREAN SECTION  2007   Patient Active Problem List   Diagnosis Date Noted   OSA on CPAP 01/23/2021    PCP: Loreli Kins, MD   REFERRING PROVIDER: Rosalea Rosina SAILOR, PA  REFERRING DIAG: LOW BACK PAIN ONGOING AFTER PREGNANCY  Rationale for Evaluation and Treatment: Rehabilitation  THERAPY DIAG:  No diagnosis found.  ONSET DATE: chronic  SUBJECTIVE:                                                                                                                                                                                           SUBJECTIVE STATEMENT: Describes low back pain w/o radicular symptoms.  Symptoms aggravated by prolonged sitting/standing.  Feels better when moving as opposed being stationary  PERTINENT HISTORY:    PAIN:  Are you having pain? Yes: NPRS scale: 2-8/10 Pain location: low back Pain description: ache Aggravating factors: prolonged sitting/standing, bending and lifting Relieving factors: position changes  PRECAUTIONS: None  RED FLAGS: None   WEIGHT BEARING RESTRICTIONS: No  FALLS:  Has patient fallen in last 6 months? No  OCCUPATION: not working  PLOF: Independent  PATIENT GOALS: To manage my back pain  NEXT MD VISIT: TBD  OBJECTIVE:  Note: Objective measures were completed at Evaluation unless otherwise noted.  DIAGNOSTIC FINDINGS:  IMPRESSION: 1. L5-S1 large right paracentral disc protrusion, which contacts both descending S1 nerve roots and causes mild spinal canal stenosis. 2. Mild bilateral hydronephrosis. 3. No acute fracture or traumatic listhesis in the  thoracic or lumbar spine.     Electronically Signed   By: Donald Campion M.D.   On: 09/27/2022 19:12  PATIENT SURVEYS:  Modified Oswestry: 19/50 38% perceived disability  Interpretation of scores: Score Category Description  0-20% Minimal Disability The patient can cope with most living activities. Usually no treatment is indicated apart from advice on lifting, sitting and exercise  21-40% Moderate Disability The patient experiences more pain and difficulty with sitting, lifting and standing. Travel and social life are more difficult and they may be disabled from work. Personal care, sexual activity and sleeping are not grossly affected, and the patient can usually be managed by conservative means  41-60% Severe Disability Pain remains the main problem in this group, but activities of  daily living are affected. These patients require a detailed investigation  61-80% Crippled Back pain impinges on all aspects of the patient's life. Positive intervention is required  81-100% Bed-bound  These patients are either bed-bound or exaggerating their symptoms  Bluford FORBES Zoe DELENA Karon DELENA, et al. Surgery versus conservative management of stable thoracolumbar fracture: the PRESTO feasibility RCT. [place] ([place]): [employer]; 2021 Nov. [facility] Technology Assessment, No. 25.62.) Appendix 3, Oswestry Disability Index category descriptors. Available from: FindJewel[employer].cz  Minimally Clinically Important Difference (MCID) = 12.8%   MUSCLE LENGTH: Hamstrings: WNL Thomas test: tight quads L>R  POSTURE: No Significant postural limitations  PALPATION: deferred  LUMBAR ROM:   AROM eval  Flexion 100%  Extension 100%  Right lateral flexion 100%  Left lateral flexion 100%  Right rotation   Left rotation    (Blank rows = not tested)  LOWER EXTREMITY ROM:   WNL  Active  Right eval Left eval  Hip flexion    Hip extension    Hip abduction    Hip  adduction    Hip internal rotation    Hip external rotation    Knee flexion    Knee extension    Ankle dorsiflexion    Ankle plantarflexion    Ankle inversion    Ankle eversion     (Blank rows = not tested)  LOWER EXTREMITY MMT:    MMT Right eval Left eval  Hip flexion    Hip extension 4 4  Hip abduction    Hip adduction    Hip internal rotation    Hip external rotation    Knee flexion    Knee extension    Ankle dorsiflexion    Ankle plantarflexion    Ankle inversion    Ankle eversion     (Blank rows = not tested)  LUMBAR SPECIAL TESTS:  Straight leg raise test: Negative, Slump test: Negative, and FABER test: Negative  FUNCTIONAL TESTS:  5 times sit to stand: 4   GAIT: Distance walked: 72ft x2 Assistive device utilized: None Level of assistance: Complete Independence Comments: unremarkable  TREATMENT:                                                                                                                          [facility] Adult PT Treatment:                                                DATE: 06/10/24 Eval and HEP Self Care: Additional minutes spent for educating on updated Therapeutic Home Exercise Program as well as comparing current status to condition at start of symptoms. This included exercises focusing on stretching, strengthening, with focus on eccentric aspects. Long term goals include an improvement in range of motion, strength, endurance as well as avoiding reinjury. Patient's frequency would include in 1-2 times a  day, 3-5 times a week for a duration of 6-12 weeks. Proper technique shown and discussed handout in great detail. All questions were discussed and addressed.      PATIENT EDUCATION:  Education details: Discussed eval findings, rehab rationale and POC and patient is in agreement  Person educated: Patient Education method: Explanation and Handouts Education comprehension: verbalized understanding and needs further education  HOME EXERCISE  PROGRAM: Access Code: [email or web address] URL: [email or web address] Date: 06/10/2024 Prepared by: Reyes Kohut  Exercises - Supine 90/90 Abdominal Bracing  - 1-2 x daily - 3 x weekly - 1 sets - 10 reps - 60s hold - Supine Posterior Pelvic Tilt  - 1-2 x daily - 5 x weekly - 1 sets - 10 reps - 3s hold - Supine March with Posterior Pelvic Tilt  - 1-2 x daily - 5 x weekly - 1 sets - 10 reps - Static Prone on Elbows  - 1-2 x daily - 5 x weekly - 1 sets - 1 reps - 2 min hold  ASSESSMENT:  CLINICAL IMPRESSION: Patient is a 43 y.o. 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  No radicular symptoms present, trunk ROM WNL.  Weakness identified in core and hip extensor muscles.  She is currently exercising at [employer] and enjoys Pilates.  Patient will attend PT 1x.week until confident in self management techniques.  OBJECTIVE IMPAIRMENTS: decreased activity tolerance, decreased knowledge of condition, decreased mobility, decreased strength, increased fascial restrictions, impaired perceived functional ability, improper body mechanics, obesity, and pain.   ACTIVITY LIMITATIONS: carrying, lifting, bending, sitting, standing, sleeping, and bed mobility  PERSONAL FACTORS: Age, Past/current experiences, and Time since onset of injury/illness/exacerbation are also affecting patient's functional outcome.   REHAB POTENTIAL: Fair based on chronicity  CLINICAL DECISION MAKING: Stable/uncomplicated  EVALUATION COMPLEXITY: Low   GOALS: Goals reviewed with patient? No   SHORT TERM GOALS=LONG TERM GOALS: Target date: 08/05/2024    Patient will acknowledge 4/10 worst pain at least once during episode of care   Baseline: 8/10 Goal status: INITIAL  2.  Patient will increase 30s chair stand reps from 4 to 10 without arms to demonstrate and improved functional ability with less pain/difficulty as well as reduce  fall risk.  Baseline: 4 Goal status: INITIAL  3.  Patient will score at least 11/50 on ODI to signify clinically meaningful improvement in functional abilities.   Baseline: 19/50 Goal status: INITIAL  4.  Patient to demonstrate independence in HEP  Baseline: [id number] Goal status: INITIAL  5.  Patient to demo a painfree bridge Baseline: Unable to bridge due to pain Goal status: INITIAL    PLAN:  PT FREQUENCY: 1x/week  PT DURATION: 6 weeks  PLANNED INTERVENTIONS: 97110-Therapeutic exercises, 97530- Therapeutic activity, 97112- Neuromuscular re-education, 97535- Self Care, and 02859- Manual therapy.  PLAN FOR NEXT SESSION: HEP review and update, manual techniques as appropriate, aerobic tasks, ROM and flexibility activities, strengthening and PREs, TPDN, gait and balance training as needed   For all possible CPT codes, reference the Planned Interventions line above.     Check all conditions that are expected to impact treatment: {Conditions expected to impact treatment:Structural or anatomic abnormalities and Current pregnancy or recent postpartum   If treatment provided at initial evaluation, no treatment charged due to lack of authorization.       Wilkie Zenon M Alania Overholt, PT 06/22/2024, 1:11 PM

## 2024-06-24 ENCOUNTER — Ambulatory Visit

## 2024-06-29 NOTE — Therapy (Unsigned)
 OUTPATIENT PHYSICAL THERAPY TREATMENT NOTE   Patient Name: Heather Kelly MRN: [id number] DOB:11/14/1981, 43 y.o., female Today's Date: 07/01/2024  END OF SESSION:  PT End of Session - 07/01/24 1325     Visit Number 2    Number of Visits 12    Date for PT Re-Evaluation 08/10/24    Authorization Type MCD    PT Start Time 1325   late for session   PT Stop Time 1400    PT Time Calculation (min) 35 min    Activity Tolerance Patient tolerated treatment well    Behavior During Therapy [facility] for tasks assessed/performed           Past Medical History:  Diagnosis Date   Hypothyroidism    Sleep apnea    Past Surgical History:  Procedure Laterality Date   BARIATRIC SURGERY  04/10/2021   CESAREAN SECTION  2011   CESAREAN SECTION  2007   Patient Active Problem List   Diagnosis Date Noted   OSA on CPAP 01/23/2021    PCP: Loreli Kins, MD   REFERRING PROVIDER: Rosalea Rosina SAILOR, PA  REFERRING DIAG: LOW BACK PAIN ONGOING AFTER PREGNANCY  Rationale for Evaluation and Treatment: Rehabilitation  THERAPY DIAG:  Other low back pain  Muscle weakness (generalized)  HNP (herniated nucleus pulposus), lumbar  ONSET DATE: chronic  SUBJECTIVE:                                                                                                                                                                                           SUBJECTIVE STATEMENT: Has been compliant with HEP, no questions/concerns following last session   PERTINENT HISTORY:    PAIN:  Are you having pain? Yes: NPRS scale: 2-8/10 Pain location: low back Pain description: ache Aggravating factors: prolonged sitting/standing, bending and lifting Relieving factors: position changes  PRECAUTIONS: None  RED FLAGS: None   WEIGHT BEARING RESTRICTIONS: No  FALLS:  Has patient fallen in last 6 months? No  OCCUPATION: not working  PLOF: Independent  PATIENT GOALS: To manage my back pain  NEXT MD  VISIT: TBD  OBJECTIVE:  Note: Objective measures were completed at Evaluation unless otherwise noted.  DIAGNOSTIC FINDINGS:  IMPRESSION: 1. L5-S1 large right paracentral disc protrusion, which contacts both descending S1 nerve roots and causes mild spinal canal stenosis. 2. Mild bilateral hydronephrosis. 3. No acute fracture or traumatic listhesis in the thoracic or lumbar spine.     Electronically Signed   By: Donald Campion M.D.   On: 09/27/2022 19:12  PATIENT SURVEYS:  Modified Oswestry: 19/50 38% perceived disability  Interpretation of scores: Score Category Description  0-20% Minimal Disability The patient can cope with most living activities. Usually no treatment is indicated apart from advice on lifting, sitting and exercise  21-40% Moderate Disability The patient experiences more pain and difficulty with sitting, lifting and standing. Travel and social life are more difficult and they may be disabled from work. Personal care, sexual activity and sleeping are not grossly affected, and the patient can usually be managed by conservative means  41-60% Severe Disability Pain remains the main problem in this group, but activities of daily living are affected. These patients require a detailed investigation  61-80% Crippled Back pain impinges on all aspects of the patient's life. Positive intervention is required  81-100% Bed-bound  These patients are either bed-bound or exaggerating their symptoms  Bluford FORBES Zoe DELENA Karon DELENA, et al. Surgery versus conservative management of stable thoracolumbar fracture: the PRESTO feasibility RCT. [place] [employer]; 2021 Nov. [facility] Technology Assessment, No. 25.62.) Appendix 3, Oswestry Disability Index category descriptors. Available from: FindJewelers.cz  Minimally Clinically Important Difference (MCID) = 12.8%   MUSCLE LENGTH: Hamstrings: WNL Thomas test: tight quads L>R  POSTURE: No  Significant postural limitations  PALPATION: deferred  LUMBAR ROM:   AROM eval  Flexion 100%  Extension 100%  Right lateral flexion 100%  Left lateral flexion 100%  Right rotation   Left rotation    (Blank rows = not tested)  LOWER EXTREMITY ROM:   WNL  Active  Right eval Left eval  Hip flexion    Hip extension    Hip abduction    Hip adduction    Hip internal rotation    Hip external rotation    Knee flexion    Knee extension    Ankle dorsiflexion    Ankle plantarflexion    Ankle inversion    Ankle eversion     (Blank rows = not tested)  LOWER EXTREMITY MMT:    MMT Right eval Left eval  Hip flexion    Hip extension 4 4  Hip abduction    Hip adduction    Hip internal rotation    Hip external rotation    Knee flexion    Knee extension    Ankle dorsiflexion    Ankle plantarflexion    Ankle inversion    Ankle eversion     (Blank rows = not tested)  LUMBAR SPECIAL TESTS:  Straight leg raise test: Negative, Slump test: Negative, and FABER test: Negative  FUNCTIONAL TESTS:  5 times sit to stand: 4   GAIT: Distance walked: 27ft x2 Assistive device utilized: None Level of assistance: Complete Independence Comments: unremarkable  TREATMENT:          [facility] Adult PT Treatment:                                                DATE: 07/01/24 Therapeutic Exercise: Nustep L4 6 min 90/90 60s  Neuromuscular re-ed: Quadruped UE 10/10 Quadruped LE 10/10 Bird dog 101/10 Therapeutic Activity: P-ball curl ups 15x B, 15/15 unilaterally PPT with alt march 10x Prone press up 5x f/b, 5x with OP  [facility] Adult PT Treatment:                                                DATE: 06/10/24 Eval and HEP Self Care: Additional minutes spent for educating on updated Therapeutic Home Exercise Program as well as comparing current status to condition at start of symptoms.  This included exercises focusing on stretching, strengthening, with focus on eccentric aspects. Long term goals include an improvement in range of motion, strength, endurance as well as avoiding reinjury. Patient's frequency would include in 1-2 times a day, 3-5 times a week for a duration of 6-12 weeks. Proper technique shown and discussed handout in great detail. All questions were discussed and addressed.      PATIENT EDUCATION:  Education details: Discussed eval findings, rehab rationale and POC and patient is in agreement  Person educated: Patient Education method: Explanation and Handouts Education comprehension: verbalized understanding and needs further education  HOME EXERCISE PROGRAM: Access Code: [email or web address] URL: [email or web address] Date: 06/10/2024 Prepared by: Reyes Kohut  Exercises - Supine 90/90 Abdominal Bracing  - 1-2 x daily - 3 x weekly - 1 sets - 10 reps - 60s hold - Supine Posterior Pelvic Tilt  - 1-2 x daily - 5 x weekly - 1 sets - 10 reps - 3s hold - Supine March with Posterior Pelvic Tilt  - 1-2 x daily - 5 x weekly - 1 sets - 10 reps - Static Prone on Elbows  - 1-2 x daily - 5 x weekly - 1 sets - 1 reps - 2 min hold  ASSESSMENT:  CLINICAL IMPRESSION: First f/u session since IE.  Focus of treatment was core training and stability, trunk extension with PT OP.  Incorporated additional abdominal tasks and updated HEP   Patient is a 43 y.o. 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  No radicular symptoms present, trunk ROM WNL.  Weakness identified in core and hip extensor muscles.  She is currently exercising at [employer] and enjoys Pilates.  Patient will attend PT 1x.week until confident in self management techniques.  OBJECTIVE IMPAIRMENTS: decreased activity tolerance, decreased knowledge of condition, decreased mobility, decreased strength, increased fascial  restrictions, impaired perceived functional ability, improper body mechanics, obesity, and pain.   ACTIVITY LIMITATIONS: carrying, lifting, bending, sitting, standing, sleeping, and bed mobility  PERSONAL FACTORS: Age, Past/current experiences, and Time since onset of injury/illness/exacerbation are also affecting patient's functional outcome.   REHAB POTENTIAL: Fair based on chronicity  CLINICAL DECISION MAKING: Stable/uncomplicated  EVALUATION COMPLEXITY: Low   GOALS: Goals reviewed with patient? No   SHORT TERM GOALS=LONG TERM GOALS: Target date: 08/05/2024    Patient will acknowledge 4/10 worst pain at least once during episode of care   Baseline: 8/10 Goal status: INITIAL  2.  Patient will increase 30s chair stand reps from 4 to 10 without arms to demonstrate and improved functional ability with less pain/difficulty as well as reduce fall risk.  Baseline: 4 Goal status: INITIAL  3.  Patient will score at least 11/50 on ODI to signify clinically meaningful improvement in functional abilities.   Baseline: 19/50 Goal status: INITIAL  4.  Patient to demonstrate independence in HEP  Baseline: [id number] Goal status: INITIAL  5.  Patient to demo a painfree bridge Baseline: Unable to  bridge due to pain Goal status: INITIAL    PLAN:  PT FREQUENCY: 1x/week  PT DURATION: 6 weeks  PLANNED INTERVENTIONS: 97110-Therapeutic exercises, 97530- Therapeutic activity, [id number]- Neuromuscular re-education, 97535- Self Care, and [id number]- Manual therapy.  PLAN FOR NEXT SESSION: HEP review and update, manual techniques as appropriate, aerobic tasks, ROM and flexibility activities, strengthening and PREs, TPDN, gait and balance training as needed   For all possible CPT codes, reference the Planned Interventions line above.     Check all conditions that are expected to impact treatment: {Conditions expected to impact treatment:Structural or anatomic abnormalities and Current pregnancy or  recent postpartum   If treatment provided at initial evaluation, no treatment charged due to lack of authorization.       Fatim Vanderschaaf M Neno [place], PT 07/01/2024, 3:01 PM

## 2024-07-01 ENCOUNTER — Ambulatory Visit: Attending: Physician Assistant

## 2024-07-01 DIAGNOSIS — M5459 Other low back pain: Secondary | ICD-10-CM | POA: Insufficient documentation

## 2024-07-01 DIAGNOSIS — M5126 Other intervertebral disc displacement, lumbar region: Secondary | ICD-10-CM | POA: Diagnosis present

## 2024-07-01 DIAGNOSIS — M6281 Muscle weakness (generalized): Secondary | ICD-10-CM | POA: Diagnosis present

## 2024-07-06 NOTE — Therapy (Unsigned)
 OUTPATIENT PHYSICAL THERAPY TREATMENT NOTE   Patient Name: Heather Kelly MRN: [id number] DOB:[birth year], 43 y.o., female Today's Date: 07/07/2024  END OF SESSION:  PT End of Session - 07/07/24 1400     Visit Number 3    Number of Visits 12    Date for PT Re-Evaluation 08/10/24    Authorization Type MCD    Authorization - Visit Number 3    Authorization - Number of Visits 27    PT Start Time 1400    PT Stop Time 1440    PT Time Calculation (min) 40 min    Activity Tolerance Patient tolerated treatment well    Behavior During Therapy [facility] for tasks assessed/performed            Past Medical History:  Diagnosis Date   Hypothyroidism    Sleep apnea    Past Surgical History:  Procedure Laterality Date   BARIATRIC SURGERY  04/10/2021   CESAREAN SECTION  2011   CESAREAN SECTION  2007   Patient Active Problem List   Diagnosis Date Noted   OSA on CPAP 01/23/2021    PCP: Loreli Kins, MD   REFERRING PROVIDER: Rosalea Rosina SAILOR, PA  REFERRING DIAG: LOW BACK PAIN ONGOING AFTER PREGNANCY  Rationale for Evaluation and Treatment: Rehabilitation  THERAPY DIAG:  Other low back pain  Muscle weakness (generalized)  HNP (herniated nucleus pulposus), lumbar  ONSET DATE: chronic  SUBJECTIVE:                                                                                                                                                                                           SUBJECTIVE STATEMENT: Reported some low back spasms following last session which she has had before.  Does not limit function though.  PERTINENT HISTORY:    PAIN:  Are you having pain? Yes: NPRS scale: 2-8/10 Pain location: low back Pain description: ache Aggravating factors: prolonged sitting/standing, bending and lifting Relieving factors: position changes  PRECAUTIONS: None  RED FLAGS: None   WEIGHT BEARING RESTRICTIONS: No  FALLS:  Has patient fallen in last 6 months?  No  OCCUPATION: not working  PLOF: Independent  PATIENT GOALS: To manage my back pain  NEXT MD VISIT: TBD  OBJECTIVE:  Note: Objective measures were completed at Evaluation unless otherwise noted.  DIAGNOSTIC FINDINGS:  IMPRESSION: 1. L5-S1 large right paracentral disc protrusion, which contacts both descending S1 nerve roots and causes mild spinal canal stenosis. 2. Mild bilateral hydronephrosis. 3. No acute fracture or traumatic listhesis in the thoracic or lumbar spine.     Electronically Signed   By: Donald Jeanann HERO.D.  On: 09/27/2022 19:12  PATIENT SURVEYS:  Modified Oswestry: 19/50 38% perceived disability  Interpretation of scores: Score Category Description  0-20% Minimal Disability The patient can cope with most living activities. Usually no treatment is indicated apart from advice on lifting, sitting and exercise  21-40% Moderate Disability The patient experiences more pain and difficulty with sitting, lifting and standing. Travel and social life are more difficult and they may be disabled from work. Personal care, sexual activity and sleeping are not grossly affected, and the patient can usually be managed by conservative means  41-60% Severe Disability Pain remains the main problem in this group, but activities of daily living are affected. These patients require a detailed investigation  61-80% Crippled Back pain impinges on all aspects of the patient's life. Positive intervention is required  81-100% Bed-bound  These patients are either bed-bound or exaggerating their symptoms  Bluford FORBES Zoe DELENA Karon DELENA, et al. Surgery versus conservative management of stable thoracolumbar fracture: the PRESTO feasibility RCT. [place] ([place]): [employer]; 2021 Nov. [facility] Technology Assessment, No. 25.62.) Appendix 3, Oswestry Disability Index category descriptors. Available from: FindJewelers.cz  Minimally Clinically Important  Difference (MCID) = 12.8%   MUSCLE LENGTH: Hamstrings: WNL Thomas test: tight quads L>R  POSTURE: No Significant postural limitations  PALPATION: deferred  LUMBAR ROM:   AROM eval  Flexion 100%  Extension 100%  Right lateral flexion 100%  Left lateral flexion 100%  Right rotation   Left rotation    (Blank rows = not tested)  LOWER EXTREMITY ROM:   WNL  Active  Right eval Left eval  Hip flexion    Hip extension    Hip abduction    Hip adduction    Hip internal rotation    Hip external rotation    Knee flexion    Knee extension    Ankle dorsiflexion    Ankle plantarflexion    Ankle inversion    Ankle eversion     (Blank rows = not tested)  LOWER EXTREMITY MMT:    MMT Right eval Left eval  Hip flexion    Hip extension 4 4  Hip abduction    Hip adduction    Hip internal rotation    Hip external rotation    Knee flexion    Knee extension    Ankle dorsiflexion    Ankle plantarflexion    Ankle inversion    Ankle eversion     (Blank rows = not tested)  LUMBAR SPECIAL TESTS:  Straight leg raise test: Negative, Slump test: Negative, and FABER test: Negative  FUNCTIONAL TESTS:  5 times sit to stand: 4   GAIT: Distance walked: 63ft x2 Assistive device utilized: None Level of assistance: Complete Independence Comments: unremarkable  TREATMENT:      [facility] Adult PT Treatment:                                                DATE: 07/07/24 Therapeutic Exercise: Nustep L4 8 min Neuromuscular re-ed: Supine hip fallouts BluTB 15x B, 15/15 unilaterally Bridge against BluTB 15x S/L clams BluTB 15/15 Bridge with ball 15x Therapeutic Activity: Seated hamstring stretch 30s B Supine QL stretch 30s x2 B P-ball curl ups 15x B, 15/15 unilaterally     [facility] Adult PT Treatment:  DATE: 07/01/24 Therapeutic Exercise: Nustep L4 6 min 90/90 60s  Neuromuscular re-ed: Quadruped UE 10/10 Quadruped LE 10/10 Bird dog  10/10 Therapeutic Activity: P-ball curl ups 15x B, 15/15 unilaterally PPT with alt march 10x Prone press up 5x f/b, 5x with OP                                                                                                                 [facility] Adult PT Treatment:                                                DATE: 06/10/24 Eval and HEP Self Care: Additional minutes spent for educating on updated Therapeutic Home Exercise Program as well as comparing current status to condition at start of symptoms. This included exercises focusing on stretching, strengthening, with focus on eccentric aspects. Long term goals include an improvement in range of motion, strength, endurance as well as avoiding reinjury. Patient's frequency would include in 1-2 times a day, 3-5 times a week for a duration of 6-12 weeks. Proper technique shown and discussed handout in great detail. All questions were discussed and addressed.      PATIENT EDUCATION:  Education details: Discussed eval findings, rehab rationale and POC and patient is in agreement  Person educated: Patient Education method: Explanation and Handouts Education comprehension: verbalized understanding and needs further education  HOME EXERCISE PROGRAM: Access Code: [email or web address] URL: [email or web address] Date: 06/10/2024 Prepared by: Reyes Kohut  Exercises - Supine 90/90 Abdominal Bracing  - 1-2 x daily - 3 x weekly - 1 sets - 10 reps - 60s hold - Supine Posterior Pelvic Tilt  - 1-2 x daily - 5 x weekly - 1 sets - 10 reps - 3s hold - Supine March with Posterior Pelvic Tilt  - 1-2 x daily - 5 x weekly - 1 sets - 10 reps - Static Prone on Elbows  - 1-2 x daily - 5 x weekly - 1 sets - 1 reps - 2 min hold  ASSESSMENT:  CLINICAL IMPRESSION: Focus of today's session stretching followed by abdominal and core strengthening. Incorporated hip abduction and extension strengthening to stabilize posterior chain.  HEP reviewed and  updated.   Patient is a 42 y.o. 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  No radicular symptoms present, trunk ROM WNL.  Weakness identified in core and hip extensor muscles.  She is currently exercising at [employer] and enjoys Pilates.  Patient will attend PT 1x.week until confident in self management techniques.  OBJECTIVE IMPAIRMENTS: decreased activity tolerance, decreased knowledge of condition, decreased mobility, decreased strength, increased fascial restrictions, impaired perceived functional ability, improper body mechanics, obesity, and pain.   ACTIVITY LIMITATIONS: carrying, lifting, bending, sitting, standing, sleeping, and bed mobility  PERSONAL FACTORS: Age, Past/current experiences, and Time since onset of  injury/illness/exacerbation are also affecting patient's functional outcome.   REHAB POTENTIAL: Fair based on chronicity  CLINICAL DECISION MAKING: Stable/uncomplicated  EVALUATION COMPLEXITY: Low   GOALS: Goals reviewed with patient? No   SHORT TERM GOALS=LONG TERM GOALS: Target date: 08/05/2024    Patient will acknowledge 4/10 worst pain at least once during episode of care   Baseline: 8/10 Goal status: INITIAL  2.  Patient will increase 30s chair stand reps from 4 to 10 without arms to demonstrate and improved functional ability with less pain/difficulty as well as reduce fall risk.  Baseline: 4 Goal status: INITIAL  3.  Patient will score at least 11/50 on ODI to signify clinically meaningful improvement in functional abilities.   Baseline: 19/50 Goal status: INITIAL  4.  Patient to demonstrate independence in HEP  Baseline: [id number] Goal status: INITIAL  5.  Patient to demo a painfree bridge Baseline: Unable to bridge due to pain Goal status: INITIAL    PLAN:  PT FREQUENCY: 1x/week  PT DURATION: 6 weeks  PLANNED INTERVENTIONS: 97110-Therapeutic  exercises, 97530- Therapeutic activity, 97112- Neuromuscular re-education, 97535- Self Care, and 02859- Manual therapy.  PLAN FOR NEXT SESSION: HEP review and update, manual techniques as appropriate, aerobic tasks, ROM and flexibility activities, strengthening and PREs, TPDN, gait and balance training as needed   For all possible CPT codes, reference the Planned Interventions line above.     Check all conditions that are expected to impact treatment: {Conditions expected to impact treatment:Structural or anatomic abnormalities and Current pregnancy or recent postpartum   If treatment provided at initial evaluation, no treatment charged due to lack of authorization.       Salem Lembke M Alben Jepsen, PT 07/07/2024, 2:01 PM

## 2024-07-07 ENCOUNTER — Ambulatory Visit

## 2024-07-07 DIAGNOSIS — M5459 Other low back pain: Secondary | ICD-10-CM

## 2024-07-07 DIAGNOSIS — M5126 Other intervertebral disc displacement, lumbar region: Secondary | ICD-10-CM

## 2024-07-07 DIAGNOSIS — M6281 Muscle weakness (generalized): Secondary | ICD-10-CM

## 2024-07-08 ENCOUNTER — Ambulatory Visit

## 2024-07-11 ENCOUNTER — Encounter: Payer: Self-pay | Admitting: Obstetrics and Gynecology

## 2024-07-14 NOTE — Therapy (Unsigned)
 OUTPATIENT PHYSICAL THERAPY TREATMENT NOTE   Patient Name: Heather Kelly MRN: [id number] DOB:[birth year], 43 y.o., female Today's Date: 07/15/2024  END OF SESSION:  PT End of Session - 07/15/24 1320     Visit Number 4    Date for PT Re-Evaluation 08/10/24    Authorization Type MCD    Authorization - Visit Number 4    Authorization - Number of Visits 27    PT Start Time 1320    PT Stop Time 1400    PT Time Calculation (min) 40 min    Activity Tolerance Patient tolerated treatment well    Behavior During Therapy [facility] for tasks assessed/performed             Past Medical History:  Diagnosis Date   Hypothyroidism    Sleep apnea    Past Surgical History:  Procedure Laterality Date   BARIATRIC SURGERY  04/10/2021   CESAREAN SECTION  2011   CESAREAN SECTION  2007   Patient Active Problem List   Diagnosis Date Noted   OSA on CPAP 01/23/2021    PCP: Loreli Kins, MD   REFERRING PROVIDER: Rosalea Rosina SAILOR, PA  REFERRING DIAG: LOW BACK PAIN ONGOING AFTER PREGNANCY  Rationale for Evaluation and Treatment: Rehabilitation  THERAPY DIAG:  Other low back pain  Muscle weakness (generalized)  HNP (herniated nucleus pulposus), lumbar  ONSET DATE: chronic  SUBJECTIVE:                                                                                                                                                                                           SUBJECTIVE STATEMENT: Pain less.  Still notes back spasms and difficulty standing up from s flexed position  PERTINENT HISTORY:    PAIN:  Are you having pain? Yes: NPRS scale: 2-8/10 Pain location: low back Pain description: ache Aggravating factors: prolonged sitting/standing, bending and lifting Relieving factors: position changes  PRECAUTIONS: None  RED FLAGS: None   WEIGHT BEARING RESTRICTIONS: No  FALLS:  Has patient fallen in last 6 months? No  OCCUPATION: not working  PLOF:  Independent  PATIENT GOALS: To manage my back pain  NEXT MD VISIT: TBD  OBJECTIVE:  Note: Objective measures were completed at Evaluation unless otherwise noted.  DIAGNOSTIC FINDINGS:  IMPRESSION: 1. L5-S1 large right paracentral disc protrusion, which contacts both descending S1 nerve roots and causes mild spinal canal stenosis. 2. Mild bilateral hydronephrosis. 3. No acute fracture or traumatic listhesis in the thoracic or lumbar spine.     Electronically Signed   By: Donald Campion M.D.   On: 09/27/2022 19:12  PATIENT SURVEYS:  Modified Oswestry:  19/50 38% perceived disability  Interpretation of scores: Score Category Description  0-20% Minimal Disability The patient can cope with most living activities. Usually no treatment is indicated apart from advice on lifting, sitting and exercise  21-40% Moderate Disability The patient experiences more pain and difficulty with sitting, lifting and standing. Travel and social life are more difficult and they may be disabled from work. Personal care, sexual activity and sleeping are not grossly affected, and the patient can usually be managed by conservative means  41-60% Severe Disability Pain remains the main problem in this group, but activities of daily living are affected. These patients require a detailed investigation  61-80% Crippled Back pain impinges on all aspects of the patient's life. Positive intervention is required  81-100% Bed-bound  These patients are either bed-bound or exaggerating their symptoms  Bluford FORBES Zoe DELENA Karon DELENA, et al. Surgery versus conservative management of stable thoracolumbar fracture: the PRESTO feasibility RCT. [place] [employer]; 2021 Nov. [facility] Technology Assessment, No. 25.62.) Appendix 3, Oswestry Disability Index category descriptors. Available from: FindJewelers.cz  Minimally Clinically Important Difference (MCID) = 12.8%   MUSCLE  LENGTH: Hamstrings: WNL Thomas test: tight quads L>R  POSTURE: No Significant postural limitations  PALPATION: deferred  LUMBAR ROM:   AROM eval  Flexion 100%  Extension 100%  Right lateral flexion 100%  Left lateral flexion 100%  Right rotation   Left rotation    (Blank rows = not tested)  LOWER EXTREMITY ROM:   WNL  Active  Right eval Left eval  Hip flexion    Hip extension    Hip abduction    Hip adduction    Hip internal rotation    Hip external rotation    Knee flexion    Knee extension    Ankle dorsiflexion    Ankle plantarflexion    Ankle inversion    Ankle eversion     (Blank rows = not tested)  LOWER EXTREMITY MMT:    MMT Right eval Left eval  Hip flexion    Hip extension 4 4  Hip abduction    Hip adduction    Hip internal rotation    Hip external rotation    Knee flexion    Knee extension    Ankle dorsiflexion    Ankle plantarflexion    Ankle inversion    Ankle eversion     (Blank rows = not tested)  LUMBAR SPECIAL TESTS:  Straight leg raise test: Negative, Slump test: Negative, and FABER test: Negative  FUNCTIONAL TESTS:  5 times sit to stand: 4   GAIT: Distance walked: 48ft x2 Assistive device utilized: None Level of assistance: Complete Independence Comments: unremarkable  TREATMENT:      [facility] Adult PT Treatment:                                                DATE: 07/15/24 Therapeutic Exercise: Nustep L5 8 min Neuromuscular re-ed: Supine hip fallouts BlaTB 10x B, 10/10 unilaterally SL bridge 15/15 S/L clams BlaTB 10/10 Prone press 5x f/b 5x with PT OP Therapeutic Activity: Goblet squats 10# KB 10x Plank on knees 30s x2 Hip flexor stretch 30x  [facility] Adult PT Treatment:  DATE: 07/07/24 Therapeutic Exercise: Nustep L4 8 min Neuromuscular re-ed: Supine hip fallouts BluTB 15x B, 15/15 unilaterally Bridge against BluTB 15x S/L clams BluTB 15/15 Bridge with ball  15x Therapeutic Activity: Seated hamstring stretch 30s B Supine QL stretch 30s x2 B P-ball curl ups 15x B, 15/15 unilaterally     [facility] Adult PT Treatment:                                                DATE: 07/01/24 Therapeutic Exercise: Nustep L4 6 min 90/90 60s  Neuromuscular re-ed: Quadruped UE 10/10 Quadruped LE 10/10 Bird dog 10/10 Therapeutic Activity: P-ball curl ups 15x B, 15/15 unilaterally PPT with alt march 10x Prone press up 5x f/b, 5x with OP                                                                                                                 [facility] Adult PT Treatment:                                                DATE: 06/10/24 Eval and HEP Self Care: Additional minutes spent for educating on updated Therapeutic Home Exercise Program as well as comparing current status to condition at start of symptoms. This included exercises focusing on stretching, strengthening, with focus on eccentric aspects. Long term goals include an improvement in range of motion, strength, endurance as well as avoiding reinjury. Patient's frequency would include in 1-2 times a day, 3-5 times a week for a duration of 6-12 weeks. Proper technique shown and discussed handout in great detail. All questions were discussed and addressed.      PATIENT EDUCATION:  Education details: Discussed eval findings, rehab rationale and POC and patient is in agreement  Person educated: Patient Education method: Explanation and Handouts Education comprehension: verbalized understanding and needs further education  HOME EXERCISE PROGRAM: Access Code: [email or web address] URL: [email or web address] Date: 07/15/2024 Prepared by: Will Heinkel  Exercises - Prone Press Up  - 1-2 x daily - 3 x weekly - 1 sets - 10 reps - Bird Dog  - 1-2 x daily - 3 x weekly - 1 sets - 10 reps - Clamshell with Resistance  - 1-2 x daily - 3 x weekly - 2 sets - 15 reps - Hooklying Single Leg Bent Knee Fallouts with  Resistance  - 1-2 x daily - 3 x weekly - 2 sets - 15 reps - Plank on Knees  - 1-2 x daily - 3 x weekly - 3 sets - 10 reps - 30s hold - Hip Flexor Stretch at Edge of Bed  - 1-2 x daily - 3 x weekly - 1 sets - 2 reps - 30s hold  ASSESSMENT:  CLINICAL IMPRESSION: Incorporated more challenging tasks  including planks and SL bridges.  Weakness prominent with planks and unable to advance to plank on toes.  SL bridge challenged patient and hip flexor stretch added.   Patient is a 43 y.o. 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  No radicular symptoms present, trunk ROM WNL.  Weakness identified in core and hip extensor muscles.  She is currently exercising at [employer] and enjoys Pilates.  Patient will attend PT 1x.week until confident in self management techniques.  OBJECTIVE IMPAIRMENTS: decreased activity tolerance, decreased knowledge of condition, decreased mobility, decreased strength, increased fascial restrictions, impaired perceived functional ability, improper body mechanics, obesity, and pain.   ACTIVITY LIMITATIONS: carrying, lifting, bending, sitting, standing, sleeping, and bed mobility  PERSONAL FACTORS: Age, Past/current experiences, and Time since onset of injury/illness/exacerbation are also affecting patient's functional outcome.   REHAB POTENTIAL: Fair based on chronicity  CLINICAL DECISION MAKING: Stable/uncomplicated  EVALUATION COMPLEXITY: Low   GOALS: Goals reviewed with patient? No   SHORT TERM GOALS=LONG TERM GOALS: Target date: 08/05/2024    Patient will acknowledge 4/10 worst pain at least once during episode of care   Baseline: 8/10 Goal status: INITIAL  2.  Patient will increase 30s chair stand reps from 4 to 10 without arms to demonstrate and improved functional ability with less pain/difficulty as well as reduce fall risk.  Baseline: 4 Goal status: INITIAL  3.   Patient will score at least 11/50 on ODI to signify clinically meaningful improvement in functional abilities.   Baseline: 19/50 Goal status: INITIAL  4.  Patient to demonstrate independence in HEP  Baseline: [id number] Goal status: INITIAL  5.  Patient to demo a painfree bridge Baseline: Unable to bridge due to pain Goal status: INITIAL    PLAN:  PT FREQUENCY: 1x/week  PT DURATION: 6 weeks  PLANNED INTERVENTIONS: 97110-Therapeutic exercises, 97530- Therapeutic activity, 97112- Neuromuscular re-education, 97535- Self Care, and 02859- Manual therapy.  PLAN FOR NEXT SESSION: HEP review and update, manual techniques as appropriate, aerobic tasks, ROM and flexibility activities, strengthening and PREs, TPDN, gait and balance training as needed   For all possible CPT codes, reference the Planned Interventions line above.     Check all conditions that are expected to impact treatment: {Conditions expected to impact treatment:Structural or anatomic abnormalities and Current pregnancy or recent postpartum   If treatment provided at initial evaluation, no treatment charged due to lack of authorization.       Puja Caffey M Amiaya Mcneeley, PT 07/15/2024, 1:31 PM

## 2024-07-15 ENCOUNTER — Ambulatory Visit

## 2024-07-15 DIAGNOSIS — M5126 Other intervertebral disc displacement, lumbar region: Secondary | ICD-10-CM

## 2024-07-15 DIAGNOSIS — M6281 Muscle weakness (generalized): Secondary | ICD-10-CM

## 2024-07-15 DIAGNOSIS — M5459 Other low back pain: Secondary | ICD-10-CM

## 2024-07-27 NOTE — Therapy (Deleted)
 OUTPATIENT PHYSICAL THERAPY TREATMENT NOTE   Patient Name: Heather Kelly MRN: [id number] DOB:04/16/1981, 42 y.o., female Today's Date: 07/27/2024  END OF SESSION:       Past Medical History:  Diagnosis Date   Hypothyroidism    Sleep apnea    Past Surgical History:  Procedure Laterality Date   BARIATRIC SURGERY  04/10/2021   CESAREAN SECTION  2011   CESAREAN SECTION  2007   Patient Active Problem List   Diagnosis Date Noted   OSA on CPAP 01/23/2021    PCP: Loreli Kins, MD   REFERRING PROVIDER: Rosalea Rosina SAILOR, PA  REFERRING DIAG: LOW BACK PAIN ONGOING AFTER PREGNANCY  Rationale for Evaluation and Treatment: Rehabilitation  THERAPY DIAG:  No diagnosis found.  ONSET DATE: chronic  SUBJECTIVE:                                                                                                                                                                                           SUBJECTIVE STATEMENT: Pain less.  Still notes back spasms and difficulty standing up from s flexed position  PERTINENT HISTORY:    PAIN:  Are you having pain? Yes: NPRS scale: 2-8/10 Pain location: low back Pain description: ache Aggravating factors: prolonged sitting/standing, bending and lifting Relieving factors: position changes  PRECAUTIONS: None  RED FLAGS: None   WEIGHT BEARING RESTRICTIONS: No  FALLS:  Has patient fallen in last 6 months? No  OCCUPATION: not working  PLOF: Independent  PATIENT GOALS: To manage my back pain  NEXT MD VISIT: TBD  OBJECTIVE:  Note: Objective measures were completed at Evaluation unless otherwise noted.  DIAGNOSTIC FINDINGS:  IMPRESSION: 1. L5-S1 large right paracentral disc protrusion, which contacts both descending S1 nerve roots and causes mild spinal canal stenosis. 2. Mild bilateral hydronephrosis. 3. No acute fracture or traumatic listhesis in the thoracic or lumbar spine.     Electronically Signed   By: Donald Campion M.D.   On: 09/27/2022 19:12  PATIENT SURVEYS:  Modified Oswestry: 19/50 38% perceived disability  Interpretation of scores: Score Category Description  0-20% Minimal Disability The patient can cope with most living activities. Usually no treatment is indicated apart from advice on lifting, sitting and exercise  21-40% Moderate Disability The patient experiences more pain and difficulty with sitting, lifting and standing. Travel and social life are more difficult and they may be disabled from work. Personal care, sexual activity and sleeping are not grossly affected, and the patient can usually be managed by conservative means  41-60% Severe Disability Pain remains the main problem in this group, but activities of daily living are affected.  These patients require a detailed investigation  61-80% Crippled Back pain impinges on all aspects of the patient's life. Positive intervention is required  81-100% Bed-bound  These patients are either bed-bound or exaggerating their symptoms  Bluford FORBES Zoe DELENA Karon DELENA, et al. Surgery versus conservative management of stable thoracolumbar fracture: the PRESTO feasibility RCT. [place] ([place]): [employer]; 2021 Nov. [facility] Technology Assessment, No. 25.62.) Appendix 3, Oswestry Disability Index category descriptors. Available from: FindJewelers.cz  Minimally Clinically Important Difference (MCID) = 12.8%   MUSCLE LENGTH: Hamstrings: WNL Thomas test: tight quads L>R  POSTURE: No Significant postural limitations  PALPATION: deferred  LUMBAR ROM:   AROM eval  Flexion 100%  Extension 100%  Right lateral flexion 100%  Left lateral flexion 100%  Right rotation   Left rotation    (Blank rows = not tested)  LOWER EXTREMITY ROM:   WNL  Active  Right eval Left eval  Hip flexion    Hip extension    Hip abduction    Hip adduction    Hip internal rotation    Hip external rotation    Knee  flexion    Knee extension    Ankle dorsiflexion    Ankle plantarflexion    Ankle inversion    Ankle eversion     (Blank rows = not tested)  LOWER EXTREMITY MMT:    MMT Right eval Left eval  Hip flexion    Hip extension 4 4  Hip abduction    Hip adduction    Hip internal rotation    Hip external rotation    Knee flexion    Knee extension    Ankle dorsiflexion    Ankle plantarflexion    Ankle inversion    Ankle eversion     (Blank rows = not tested)  LUMBAR SPECIAL TESTS:  Straight leg raise test: Negative, Slump test: Negative, and FABER test: Negative  FUNCTIONAL TESTS:  5 times sit to stand: 4   GAIT: Distance walked: 6ft x2 Assistive device utilized: None Level of assistance: Complete Independence Comments: unremarkable  TREATMENT:      [facility] Adult PT Treatment:                                                DATE: 07/15/24 Therapeutic Exercise: Nustep L5 8 min Neuromuscular re-ed: Supine hip fallouts BlaTB 10x B, 10/10 unilaterally SL bridge 15/15 S/L clams BlaTB 10/10 Prone press 5x f/b 5x with PT OP Therapeutic Activity: Goblet squats 10# KB 10x Plank on knees 30s x2 Hip flexor stretch 30x  [facility] Adult PT Treatment:                                                DATE: 07/07/24 Therapeutic Exercise: Nustep L4 8 min Neuromuscular re-ed: Supine hip fallouts BluTB 15x B, 15/15 unilaterally Bridge against BluTB 15x S/L clams BluTB 15/15 Bridge with ball 15x Therapeutic Activity: Seated hamstring stretch 30s B Supine QL stretch 30s x2 B P-ball curl ups 15x B, 15/15 unilaterally     [facility] Adult PT Treatment:  DATE: 07/01/24 Therapeutic Exercise: Nustep L4 6 min 90/90 60s  Neuromuscular re-ed: Quadruped UE 10/10 Quadruped LE 10/10 Bird dog 10/10 Therapeutic Activity: P-ball curl ups 15x B, 15/15 unilaterally PPT with alt march 10x Prone press up 5x f/b, 5x with OP                                                                                                                  [facility] Adult PT Treatment:                                                DATE: 06/10/24 Eval and HEP Self Care: Additional minutes spent for educating on updated Therapeutic Home Exercise Program as well as comparing current status to condition at start of symptoms. This included exercises focusing on stretching, strengthening, with focus on eccentric aspects. Long term goals include an improvement in range of motion, strength, endurance as well as avoiding reinjury. Patient's frequency would include in 1-2 times a day, 3-5 times a week for a duration of 6-12 weeks. Proper technique shown and discussed handout in great detail. All questions were discussed and addressed.      PATIENT EDUCATION:  Education details: Discussed eval findings, rehab rationale and POC and patient is in agreement  Person educated: Patient Education method: Explanation and Handouts Education comprehension: verbalized understanding and needs further education  HOME EXERCISE PROGRAM: Access Code: [email or web address] URL: [email or web address] Date: 07/15/2024 Prepared by: Tynasia Mccaul  Exercises - Prone Press Up  - 1-2 x daily - 3 x weekly - 1 sets - 10 reps - Bird Dog  - 1-2 x daily - 3 x weekly - 1 sets - 10 reps - Clamshell with Resistance  - 1-2 x daily - 3 x weekly - 2 sets - 15 reps - Hooklying Single Leg Bent Knee Fallouts with Resistance  - 1-2 x daily - 3 x weekly - 2 sets - 15 reps - Plank on Knees  - 1-2 x daily - 3 x weekly - 3 sets - 10 reps - 30s hold - Hip Flexor Stretch at Edge of Bed  - 1-2 x daily - 3 x weekly - 1 sets - 2 reps - 30s hold  ASSESSMENT:  CLINICAL IMPRESSION: Incorporated more challenging tasks including planks and SL bridges.  Weakness prominent with planks and unable to advance to plank on toes.  SL bridge challenged patient and hip flexor stretch added.   Patient is a 43 y.o. 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  No radicular symptoms present, trunk ROM WNL.  Weakness identified in core and hip extensor muscles.  She is currently exercising at [employer] and enjoys Pilates.  Patient will attend PT 1x.week until confident in self management techniques.  OBJECTIVE IMPAIRMENTS: decreased activity tolerance, decreased knowledge of  condition, decreased mobility, decreased strength, increased fascial restrictions, impaired perceived functional ability, improper body mechanics, obesity, and pain.   ACTIVITY LIMITATIONS: carrying, lifting, bending, sitting, standing, sleeping, and bed mobility  PERSONAL FACTORS: Age, Past/current experiences, and Time since onset of injury/illness/exacerbation are also affecting patient's functional outcome.   REHAB POTENTIAL: Fair based on chronicity  CLINICAL DECISION MAKING: Stable/uncomplicated  EVALUATION COMPLEXITY: Low   GOALS: Goals reviewed with patient? No   SHORT TERM GOALS=LONG TERM GOALS: Target date: 08/05/2024    Patient will acknowledge 4/10 worst pain at least once during episode of care   Baseline: 8/10 Goal status: INITIAL  2.  Patient will increase 30s chair stand reps from 4 to 10 without arms to demonstrate and improved functional ability with less pain/difficulty as well as reduce fall risk.  Baseline: 4 Goal status: INITIAL  3.  Patient will score at least 11/50 on ODI to signify clinically meaningful improvement in functional abilities.   Baseline: 19/50 Goal status: INITIAL  4.  Patient to demonstrate independence in HEP  Baseline: [id number] Goal status: INITIAL  5.  Patient to demo a painfree bridge Baseline: Unable to bridge due to pain Goal status: INITIAL    PLAN:  PT FREQUENCY: 1x/week  PT DURATION: 6 weeks  PLANNED INTERVENTIONS: 97110-Therapeutic exercises, 97530- Therapeutic activity, 97112- Neuromuscular  re-education, 97535- Self Care, and 02859- Manual therapy.  PLAN FOR NEXT SESSION: HEP review and update, manual techniques as appropriate, aerobic tasks, ROM and flexibility activities, strengthening and PREs, TPDN, gait and balance training as needed   For all possible CPT codes, reference the Planned Interventions line above.     Check all conditions that are expected to impact treatment: {Conditions expected to impact treatment:Structural or anatomic abnormalities and Current pregnancy or recent postpartum   If treatment provided at initial evaluation, no treatment charged due to lack of authorization.       Daelin Haste M Rafel Garde, PT 07/27/2024, 12:23 PM

## 2024-07-28 ENCOUNTER — Ambulatory Visit

## 2024-08-02 NOTE — Therapy (Deleted)
 OUTPATIENT PHYSICAL THERAPY TREATMENT NOTE   Patient Name: Heather Kelly MRN: [id number] DOB:[birth year], 43 y.o., female Today's Date: 08/02/2024  END OF SESSION:       Past Medical History:  Diagnosis Date   Hypothyroidism    Sleep apnea    Past Surgical History:  Procedure Laterality Date   BARIATRIC SURGERY  04/10/2021   CESAREAN SECTION  2011   CESAREAN SECTION  2007   Patient Active Problem List   Diagnosis Date Noted   OSA on CPAP 01/23/2021    PCP: Loreli Kins, MD   REFERRING PROVIDER: Rosalea Rosina SAILOR, PA  REFERRING DIAG: LOW BACK PAIN ONGOING AFTER PREGNANCY  Rationale for Evaluation and Treatment: Rehabilitation  THERAPY DIAG:  No diagnosis found.  ONSET DATE: chronic  SUBJECTIVE:                                                                                                                                                                                           SUBJECTIVE STATEMENT: Pain less.  Still notes back spasms and difficulty standing up from s flexed position  PERTINENT HISTORY:    PAIN:  Are you having pain? Yes: NPRS scale: 2-8/10 Pain location: low back Pain description: ache Aggravating factors: prolonged sitting/standing, bending and lifting Relieving factors: position changes  PRECAUTIONS: None  RED FLAGS: None   WEIGHT BEARING RESTRICTIONS: No  FALLS:  Has patient fallen in last 6 months? No  OCCUPATION: not working  PLOF: Independent  PATIENT GOALS: To manage my back pain  NEXT MD VISIT: TBD  OBJECTIVE:  Note: Objective measures were completed at Evaluation unless otherwise noted.  DIAGNOSTIC FINDINGS:  IMPRESSION: 1. L5-S1 large right paracentral disc protrusion, which contacts both descending S1 nerve roots and causes mild spinal canal stenosis. 2. Mild bilateral hydronephrosis. 3. No acute fracture or traumatic listhesis in the thoracic or lumbar spine.     Electronically Signed   By: Donald Campion M.D.   On: 09/27/2022 19:12  PATIENT SURVEYS:  Modified Oswestry: 19/50 38% perceived disability  Interpretation of scores: Score Category Description  0-20% Minimal Disability The patient can cope with most living activities. Usually no treatment is indicated apart from advice on lifting, sitting and exercise  21-40% Moderate Disability The patient experiences more pain and difficulty with sitting, lifting and standing. Travel and social life are more difficult and they may be disabled from work. Personal care, sexual activity and sleeping are not grossly affected, and the patient can usually be managed by conservative means  41-60% Severe Disability Pain remains the main problem in this group, but activities of daily living are affected.  These patients require a detailed investigation  61-80% Crippled Back pain impinges on all aspects of the patient's life. Positive intervention is required  81-100% Bed-bound  These patients are either bed-bound or exaggerating their symptoms  Bluford FORBES Zoe DELENA Karon DELENA, et al. Surgery versus conservative management of stable thoracolumbar fracture: the PRESTO feasibility RCT. [place] ([place]): [employer]; 2021 Nov. [facility] Technology Assessment, No. 25.62.) Appendix 3, Oswestry Disability Index category descriptors. Available from: FindJewel[employer].cz  Minimally Clinically Important Difference (MCID) = 12.8%   MUSCLE LENGTH: Hamstrings: WNL Thomas test: tight quads L>R  POSTURE: No Significant postural limitations  PALPATION: deferred  LUMBAR ROM:   AROM eval  Flexion 100%  Extension 100%  Right lateral flexion 100%  Left lateral flexion 100%  Right rotation   Left rotation    (Blank rows = not tested)  LOWER EXTREMITY ROM:   WNL  Active  Right eval Left eval  Hip flexion    Hip extension    Hip abduction    Hip adduction    Hip internal rotation    Hip external rotation    Knee  flexion    Knee extension    Ankle dorsiflexion    Ankle plantarflexion    Ankle inversion    Ankle eversion     (Blank rows = not tested)  LOWER EXTREMITY MMT:    MMT Right eval Left eval  Hip flexion    Hip extension 4 4  Hip abduction    Hip adduction    Hip internal rotation    Hip external rotation    Knee flexion    Knee extension    Ankle dorsiflexion    Ankle plantarflexion    Ankle inversion    Ankle eversion     (Blank rows = not tested)  LUMBAR SPECIAL TESTS:  Straight leg raise test: Negative, Slump test: Negative, and FABER test: Negative  FUNCTIONAL TESTS:  5 times sit to stand: 4   GAIT: Distance walked: 39ft x2 Assistive device utilized: None Level of assistance: Complete Independence Comments: unremarkable  TREATMENT:      [facility] Adult PT Treatment:                                                DATE: 07/15/24 Therapeutic Exercise: Nustep L5 8 min Neuromuscular re-ed: Supine hip fallouts BlaTB 10x B, 10/10 unilaterally SL bridge 15/15 S/L clams BlaTB 10/10 Prone press 5x f/b 5x with PT OP Therapeutic Activity: Goblet squats 10# KB 10x Plank on knees 30s x2 Hip flexor stretch 30x  [facility] Adult PT Treatment:                                                DATE: 07/07/24 Therapeutic Exercise: Nustep L4 8 min Neuromuscular re-ed: Supine hip fallouts BluTB 15x B, 15/15 unilaterally Bridge against BluTB 15x S/L clams BluTB 15/15 Bridge with ball 15x Therapeutic Activity: Seated hamstring stretch 30s B Supine QL stretch 30s x2 B P-ball curl ups 15x B, 15/15 unilaterally     [facility] Adult PT Treatment:  DATE: 07/01/24 Therapeutic Exercise: Nustep L4 6 min 90/90 60s  Neuromuscular re-ed: Quadruped UE 10/10 Quadruped LE 10/10 Bird dog 10/10 Therapeutic Activity: P-ball curl ups 15x B, 15/15 unilaterally PPT with alt march 10x Prone press up 5x f/b, 5x with OP                                                                                                                  [facility] Adult PT Treatment:                                                DATE: 06/10/24 Eval and HEP Self Care: Additional minutes spent for educating on updated Therapeutic Home Exercise Program as well as comparing current status to condition at start of symptoms. This included exercises focusing on stretching, strengthening, with focus on eccentric aspects. Long term goals include an improvement in range of motion, strength, endurance as well as avoiding reinjury. Patient's frequency would include in 1-2 times a day, 3-5 times a week for a duration of 6-12 weeks. Proper technique shown and discussed handout in great detail. All questions were discussed and addressed.      PATIENT EDUCATION:  Education details: Discussed eval findings, rehab rationale and POC and patient is in agreement  Person educated: Patient Education method: Explanation and Handouts Education comprehension: verbalized understanding and needs further education  HOME EXERCISE PROGRAM: Access Code: [email or web address] URL: [email or web address] Date: 07/15/2024 Prepared by: Chelly Dombeck  Exercises - Prone Press Up  - 1-2 x daily - 3 x weekly - 1 sets - 10 reps - Bird Dog  - 1-2 x daily - 3 x weekly - 1 sets - 10 reps - Clamshell with Resistance  - 1-2 x daily - 3 x weekly - 2 sets - 15 reps - Hooklying Single Leg Bent Knee Fallouts with Resistance  - 1-2 x daily - 3 x weekly - 2 sets - 15 reps - Plank on Knees  - 1-2 x daily - 3 x weekly - 3 sets - 10 reps - 30s hold - Hip Flexor Stretch at Edge of Bed  - 1-2 x daily - 3 x weekly - 1 sets - 2 reps - 30s hold  ASSESSMENT:  CLINICAL IMPRESSION: Incorporated more challenging tasks including planks and SL bridges.  Weakness prominent with planks and unable to advance to plank on toes.  SL bridge challenged patient and hip flexor stretch added.   Patient is a 43 y.o. 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  No radicular symptoms present, trunk ROM WNL.  Weakness identified in core and hip extensor muscles.  She is currently exercising at [employer] and enjoys Pilates.  Patient will attend PT 1x.week until confident in self management techniques.  OBJECTIVE IMPAIRMENTS: decreased activity tolerance, decreased knowledge of  condition, decreased mobility, decreased strength, increased fascial restrictions, impaired perceived functional ability, improper body mechanics, obesity, and pain.   ACTIVITY LIMITATIONS: carrying, lifting, bending, sitting, standing, sleeping, and bed mobility  PERSONAL FACTORS: Age, Past/current experiences, and Time since onset of injury/illness/exacerbation are also affecting patient's functional outcome.   REHAB POTENTIAL: Fair based on chronicity  CLINICAL DECISION MAKING: Stable/uncomplicated  EVALUATION COMPLEXITY: Low   GOALS: Goals reviewed with patient? No   SHORT TERM GOALS=LONG TERM GOALS: Target date: 08/05/2024    Patient will acknowledge 4/10 worst pain at least once during episode of care   Baseline: 8/10 Goal status: INITIAL  2.  Patient will increase 30s chair stand reps from 4 to 10 without arms to demonstrate and improved functional ability with less pain/difficulty as well as reduce fall risk.  Baseline: 4 Goal status: INITIAL  3.  Patient will score at least 11/50 on ODI to signify clinically meaningful improvement in functional abilities.   Baseline: 19/50 Goal status: INITIAL  4.  Patient to demonstrate independence in HEP  Baseline: [id number] Goal status: INITIAL  5.  Patient to demo a painfree bridge Baseline: Unable to bridge due to pain Goal status: INITIAL    PLAN:  PT FREQUENCY: 1x/week  PT DURATION: 6 weeks  PLANNED INTERVENTIONS: 97110-Therapeutic exercises, 97530- Therapeutic activity, 97112- Neuromuscular  re-education, 97535- Self Care, and 02859- Manual therapy.  PLAN FOR NEXT SESSION: HEP review and update, manual techniques as appropriate, aerobic tasks, ROM and flexibility activities, strengthening and PREs, TPDN, gait and balance training as needed   For all possible CPT codes, reference the Planned Interventions line above.     Check all conditions that are expected to impact treatment: {Conditions expected to impact treatment:Structural or anatomic abnormalities and Current pregnancy or recent postpartum   If treatment provided at initial evaluation, no treatment charged due to lack of authorization.       Leeann Bady M Naftuli Dalsanto, PT 08/02/2024, 3:36 PM

## 2024-08-03 NOTE — Therapy (Unsigned)
 OUTPATIENT PHYSICAL THERAPY TREATMENT NOTE/RE-CERT   Patient Name: Heather Kelly MRN: [id number] DOB:01-31-1981, 43 y.o., female Today's Date: 08/05/2024  END OF SESSION:  PT End of Session - 08/05/24 1134     Visit Number 5    Number of Visits 12    Date for PT Re-Evaluation 10/10/24    Authorization Type MCD    Authorization - Visit Number 5    Authorization - Number of Visits 27    PT Start Time 1135    PT Stop Time 1215    PT Time Calculation (min) 40 min    Activity Tolerance Patient tolerated treatment well    Behavior During Therapy [facility] for tasks assessed/performed              Past Medical History:  Diagnosis Date   Hypothyroidism    Sleep apnea    Past Surgical History:  Procedure Laterality Date   BARIATRIC SURGERY  04/10/2021   CESAREAN SECTION  2011   CESAREAN SECTION  2007   Patient Active Problem List   Diagnosis Date Noted   OSA on CPAP 01/23/2021    PCP: Loreli Kins, MD   REFERRING PROVIDER: Rosalea Rosina SAILOR, PA  REFERRING DIAG: LOW BACK PAIN ONGOING AFTER PREGNANCY  Rationale for Evaluation and Treatment: Rehabilitation  THERAPY DIAG:  Other low back pain  Muscle weakness (generalized)  HNP (herniated nucleus pulposus), lumbar  ONSET DATE: chronic  SUBJECTIVE:                                                                                                                                                                                           SUBJECTIVE STATEMENT: Pain less.  3-4/10 at best, 8/10 at worst, less frequent and less duration.  PERTINENT HISTORY:    PAIN:  Are you having pain? Yes: NPRS scale: 2-8/10 Pain location: low back Pain description: ache Aggravating factors: prolonged sitting/standing, bending and lifting Relieving factors: position changes  PRECAUTIONS: None  RED FLAGS: None   WEIGHT BEARING RESTRICTIONS: No  FALLS:  Has patient fallen in last 6 months? No  OCCUPATION: not  working  PLOF: Independent  PATIENT GOALS: To manage my back pain  NEXT MD VISIT: TBD  OBJECTIVE:  Note: Objective measures were completed at Evaluation unless otherwise noted.  DIAGNOSTIC FINDINGS:  IMPRESSION: 1. L5-S1 large right paracentral disc protrusion, which contacts both descending S1 nerve roots and causes mild spinal canal stenosis. 2. Mild bilateral hydronephrosis. 3. No acute fracture or traumatic listhesis in the thoracic or lumbar spine.     Electronically Signed   By: Donald Campion M.D.   On: 09/27/2022  19:12  PATIENT SURVEYS:  Modified Oswestry: 19/50 38% perceived disability; 08/05/24 16/50  Interpretation of scores: Score Category Description  0-20% Minimal Disability The patient can cope with most living activities. Usually no treatment is indicated apart from advice on lifting, sitting and exercise  21-40% Moderate Disability The patient experiences more pain and difficulty with sitting, lifting and standing. Travel and social life are more difficult and they may be disabled from work. Personal care, sexual activity and sleeping are not grossly affected, and the patient can usually be managed by conservative means  41-60% Severe Disability Pain remains the main problem in this group, but activities of daily living are affected. These patients require a detailed investigation  61-80% Crippled Back pain impinges on all aspects of the patient's life. Positive intervention is required  81-100% Bed-bound  These patients are either bed-bound or exaggerating their symptoms  Bluford FORBES Zoe DELENA Karon DELENA, et al. Surgery versus conservative management of stable thoracolumbar fracture: the PRESTO feasibility RCT. [place] ([place]): [employer]; 2021 Nov. [facility] Technology Assessment, No. 25.62.) Appendix 3, Oswestry Disability Index category descriptors. Available from: FindJewel[employer].cz  Minimally Clinically Important Difference  (MCID) = 12.8%   MUSCLE LENGTH: Hamstrings: WNL Thomas test: tight quads L>R  POSTURE: No Significant postural limitations  PALPATION: deferred  LUMBAR ROM:   AROM eval  Flexion 100%  Extension 100%  Right lateral flexion 100%  Left lateral flexion 100%  Right rotation   Left rotation    (Blank rows = not tested)  LOWER EXTREMITY ROM:   WNL  Active  Right eval Left eval  Hip flexion    Hip extension    Hip abduction    Hip adduction    Hip internal rotation    Hip external rotation    Knee flexion    Knee extension    Ankle dorsiflexion    Ankle plantarflexion    Ankle inversion    Ankle eversion     (Blank rows = not tested)  LOWER EXTREMITY MMT:    MMT Right eval Left eval  Hip flexion    Hip extension 4 4  Hip abduction    Hip adduction    Hip internal rotation    Hip external rotation    Knee flexion    Knee extension    Ankle dorsiflexion    Ankle plantarflexion    Ankle inversion    Ankle eversion     (Blank rows = not tested)  LUMBAR SPECIAL TESTS:  Straight leg raise test: Negative, Slump test: Negative, and FABER test: Negative  FUNCTIONAL TESTS:  30s chair stand 4; 08/05/24 8  GAIT: Distance walked: 62ft x2 Assistive device utilized: None Level of assistance: Complete Independence Comments: unremarkable  TREATMENT:      [facility] Adult PT Treatment:                                                DATE: 08/05/24 Therapeutic Exercise: Nustep L 5 8 min Neuromuscular re-ed: Plank on toes 10s Plank on knees with hip extension 10s Goblet squats 10# 10x Side step against BluTB 63ftx2 Therapeutic Activity: Prone press 10x Bird dog 5/5 Plank on knees 30s ODI re-take and assessment of progress towards goals for re-cert  [facility] Adult PT Treatment:  DATE: 07/15/24 Therapeutic Exercise: Nustep L5 8 min Neuromuscular re-ed: Supine hip fallouts BlaTB 10x B, 10/10 unilaterally SL bridge  15/15 S/L clams BlaTB 10/10 Prone press 5x f/b 5x with PT OP Therapeutic Activity: Goblet squats 10# KB 10x Plank on knees 30s x2 Hip flexor stretch 30x  [facility] Adult PT Treatment:                                                DATE: 07/07/24 Therapeutic Exercise: Nustep L4 8 min Neuromuscular re-ed: Supine hip fallouts BluTB 15x B, 15/15 unilaterally Bridge against BluTB 15x S/L clams BluTB 15/15 Bridge with ball 15x Therapeutic Activity: Seated hamstring stretch 30s B Supine QL stretch 30s x2 B P-ball curl ups 15x B, 15/15 unilaterally     [facility] Adult PT Treatment:                                                DATE: 07/01/24 Therapeutic Exercise: Nustep L4 6 min 90/90 60s  Neuromuscular re-ed: Quadruped UE 10/10 Quadruped LE 10/10 Bird dog 10/10 Therapeutic Activity: P-ball curl ups 15x B, 15/15 unilaterally PPT with alt march 10x Prone press up 5x f/b, 5x with OP                                                                                                                 [facility] Adult PT Treatment:                                                DATE: 06/10/24 Eval and HEP Self Care: Additional minutes spent for educating on updated Therapeutic Home Exercise Program as well as comparing current status to condition at start of symptoms. This included exercises focusing on stretching, strengthening, with focus on eccentric aspects. Long term goals include an improvement in range of motion, strength, endurance as well as avoiding reinjury. Patient's frequency would include in 1-2 times a day, 3-5 times a week for a duration of 6-12 weeks. Proper technique shown and discussed handout in great detail. All questions were discussed and addressed.      PATIENT EDUCATION:  Education details: Discussed eval findings, rehab rationale and POC and patient is in agreement  Person educated: Patient Education method: Explanation and Handouts Education comprehension: verbalized understanding  and needs further education  HOME EXERCISE PROGRAM: Access Code: [email or web address] URL: [email or web address] Date: 07/15/2024 Prepared by: Mohamedamin Nifong  Exercises - Prone Press Up  - 1-2 x daily - 3 x weekly - 1 sets - 10 reps - Bird Dog  - 1-2 x daily - 3 x weekly - 1 sets - 10  reps - Clamshell with Resistance  - 1-2 x daily - 3 x weekly - 2 sets - 15 reps - Hooklying Single Leg Bent Knee Fallouts with Resistance  - 1-2 x daily - 3 x weekly - 2 sets - 15 reps - Plank on Knees  - 1-2 x daily - 3 x weekly - 3 sets - 10 reps - 30s hold - Hip Flexor Stretch at Edge of Bed  - 1-2 x daily - 3 x weekly - 1 sets - 2 reps - 30s hold  ASSESSMENT:  CLINICAL IMPRESSION: Focus of today was HEP review and update, assessment of goal progress and re certification due to progress made but goals not yet met.  See goal section for progress.   Patient is a 43 y.o. 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  No radicular symptoms present, trunk ROM WNL.  Weakness identified in core and hip extensor muscles.  She is currently exercising at [employer] and enjoys Pilates.  Patient will attend PT 1x.week until confident in self management techniques.  OBJECTIVE IMPAIRMENTS: decreased activity tolerance, decreased knowledge of condition, decreased mobility, decreased strength, increased fascial restrictions, impaired perceived functional ability, improper body mechanics, obesity, and pain.   ACTIVITY LIMITATIONS: carrying, lifting, bending, sitting, standing, sleeping, and bed mobility  PERSONAL FACTORS: Age, Past/current experiences, and Time since onset of injury/illness/exacerbation are also affecting patient's functional outcome.   REHAB POTENTIAL: Fair based on chronicity  CLINICAL DECISION MAKING: Stable/uncomplicated  EVALUATION COMPLEXITY: Low   GOALS: Goals reviewed with patient? No   SHORT TERM  GOALS=LONG TERM GOALS: Target date: 10/05/2024    Patient will acknowledge 4/10 worst pain at least once during episode of care   Baseline: 8/10; 08/05/24 3-4/10 at best. Goal status: INITIAL  2.  Patient will increase 30s chair stand reps from 4 to 10 without arms to demonstrate and improved functional ability with less pain/difficulty as well as reduce fall risk.  Baseline: 4; 08/05/24 10 Goal status: INITIAL  3.  Patient will score at least 11/50 on ODI to signify clinically meaningful improvement in functional abilities.   Baseline: 19/50; 08/05/24 16/50 Goal status: Ongoing  4.  Patient to demonstrate independence in HEP  Baseline: [id number] Goal status: Met  5.  Patient to demo a painfree bridge Baseline: Unable to bridge due to pain Goal status: Ongoing  6.  Patient to hold plank on toes 30s Baseline: 10s Goal status: INITIAL  7.  Patient to demonstrate a full prone press up Baseline: 90% Goal status: INITIAL     PLAN:  PT FREQUENCY: 1x/week  PT DURATION: 6 weeks  PLANNED INTERVENTIONS: 97110-Therapeutic exercises, 97530- Therapeutic activity, 97112- Neuromuscular re-education, 97535- Self Care, and 02859- Manual therapy.  PLAN FOR NEXT SESSION: HEP review and update, manual techniques as appropriate, aerobic tasks, ROM and flexibility activities, strengthening and PREs, TPDN, gait and balance training as needed   For all possible CPT codes, reference the Planned Interventions line above.     Check all conditions that are expected to impact treatment: {Conditions expected to impact treatment:Structural or anatomic abnormalities and Current pregnancy or recent postpartum   If treatment provided at initial evaluation, no treatment charged due to lack of authorization.       Ania Levay M Neshia Mckenzie, PT 08/05/2024, 1:13 PM

## 2024-08-04 ENCOUNTER — Ambulatory Visit

## 2024-08-05 ENCOUNTER — Ambulatory Visit: Attending: Physician Assistant

## 2024-08-05 DIAGNOSIS — M6281 Muscle weakness (generalized): Secondary | ICD-10-CM | POA: Insufficient documentation

## 2024-08-05 DIAGNOSIS — M5459 Other low back pain: Secondary | ICD-10-CM | POA: Diagnosis present

## 2024-08-05 DIAGNOSIS — M5126 Other intervertebral disc displacement, lumbar region: Secondary | ICD-10-CM | POA: Diagnosis present

## 2024-08-09 NOTE — Therapy (Deleted)
 OUTPATIENT PHYSICAL THERAPY TREATMENT NOTE/RE-CERT   Patient Name: Heather Kelly MRN: [id number] DOB:08-20-1981, 43 y.o., female Today's Date: 08/09/2024  END OF SESSION:        Past Medical History:  Diagnosis Date   Hypothyroidism    Sleep apnea    Past Surgical History:  Procedure Laterality Date   BARIATRIC SURGERY  04/10/2021   CESAREAN SECTION  2011   CESAREAN SECTION  2007   Patient Active Problem List   Diagnosis Date Noted   OSA on CPAP 01/23/2021    PCP: Loreli Kins, MD   REFERRING PROVIDER: Rosalea Rosina SAILOR, PA  REFERRING DIAG: LOW BACK PAIN ONGOING AFTER PREGNANCY  Rationale for Evaluation and Treatment: Rehabilitation  THERAPY DIAG:  No diagnosis found.  ONSET DATE: chronic  SUBJECTIVE:                                                                                                                                                                                           SUBJECTIVE STATEMENT: Pain less.  3-4/10 at best, 8/10 at worst, less frequent and less duration.  PERTINENT HISTORY:    PAIN:  Are you having pain? Yes: NPRS scale: 2-8/10 Pain location: low back Pain description: ache Aggravating factors: prolonged sitting/standing, bending and lifting Relieving factors: position changes  PRECAUTIONS: None  RED FLAGS: None   WEIGHT BEARING RESTRICTIONS: No  FALLS:  Has patient fallen in last 6 months? No  OCCUPATION: not working  PLOF: Independent  PATIENT GOALS: To manage my back pain  NEXT MD VISIT: TBD  OBJECTIVE:  Note: Objective measures were completed at Evaluation unless otherwise noted.  DIAGNOSTIC FINDINGS:  IMPRESSION: 1. L5-S1 large right paracentral disc protrusion, which contacts both descending S1 nerve roots and causes mild spinal canal stenosis. 2. Mild bilateral hydronephrosis. 3. No acute fracture or traumatic listhesis in the thoracic or lumbar spine.     Electronically Signed   By: Donald Campion M.D.   On: 09/27/2022 19:12  PATIENT SURVEYS:  Modified Oswestry: 19/50 38% perceived disability; 08/05/24 16/50  Interpretation of scores: Score Category Description  0-20% Minimal Disability The patient can cope with most living activities. Usually no treatment is indicated apart from advice on lifting, sitting and exercise  21-40% Moderate Disability The patient experiences more pain and difficulty with sitting, lifting and standing. Travel and social life are more difficult and they may be disabled from work. Personal care, sexual activity and sleeping are not grossly affected, and the patient can usually be managed by conservative means  41-60% Severe Disability Pain remains the main problem in this group, but activities of daily living  are affected. These patients require a detailed investigation  61-80% Crippled Back pain impinges on all aspects of the patient's life. Positive intervention is required  81-100% Bed-bound  These patients are either bed-bound or exaggerating their symptoms  Bluford FORBES Zoe DELENA Karon DELENA, et al. Surgery versus conservative management of stable thoracolumbar fracture: the PRESTO feasibility RCT. [place]): [employer]; 2021 Nov. [facility] Technology Assessment, No. 25.62.) Appendix 3, Oswestry Disability Index category descriptors. Available from: FindJewelers.cz  Minimally Clinically Important Difference (MCID) = 12.8%   MUSCLE LENGTH: Hamstrings: WNL Thomas test: tight quads L>R  POSTURE: No Significant postural limitations  PALPATION: deferred  LUMBAR ROM:   AROM eval  Flexion 100%  Extension 100%  Right lateral flexion 100%  Left lateral flexion 100%  Right rotation   Left rotation    (Blank rows = not tested)  LOWER EXTREMITY ROM:   WNL  Active  Right eval Left eval  Hip flexion    Hip extension    Hip abduction    Hip adduction    Hip internal rotation    Hip external rotation     Knee flexion    Knee extension    Ankle dorsiflexion    Ankle plantarflexion    Ankle inversion    Ankle eversion     (Blank rows = not tested)  LOWER EXTREMITY MMT:    MMT Right eval Left eval  Hip flexion    Hip extension 4 4  Hip abduction    Hip adduction    Hip internal rotation    Hip external rotation    Knee flexion    Knee extension    Ankle dorsiflexion    Ankle plantarflexion    Ankle inversion    Ankle eversion     (Blank rows = not tested)  LUMBAR SPECIAL TESTS:  Straight leg raise test: Negative, Slump test: Negative, and FABER test: Negative  FUNCTIONAL TESTS:  30s chair stand 4; 08/05/24 8  GAIT: Distance walked: 61ft x2 Assistive device utilized: None Level of assistance: Complete Independence Comments: unremarkable  TREATMENT:      [facility] Adult PT Treatment:                                                DATE: 08/05/24 Therapeutic Exercise: Nustep L 5 8 min Neuromuscular re-ed: Plank on toes 10s Plank on knees with hip extension 10s Goblet squats 10# 10x Side step against BluTB 8ftx2 Therapeutic Activity: Prone press 10x Bird dog 5/5 Plank on knees 30s ODI re-take and assessment of progress towards goals for re-cert  [facility] Adult PT Treatment:                                                DATE: 07/15/24 Therapeutic Exercise: Nustep L5 8 min Neuromuscular re-ed: Supine hip fallouts BlaTB 10x B, 10/10 unilaterally SL bridge 15/15 S/L clams BlaTB 10/10 Prone press 5x f/b 5x with PT OP Therapeutic Activity: Goblet squats 10# KB 10x Plank on knees 30s x2 Hip flexor stretch 30x  [facility] Adult PT Treatment:  DATE: 07/07/24 Therapeutic Exercise: Nustep L4 8 min Neuromuscular re-ed: Supine hip fallouts BluTB 15x B, 15/15 unilaterally Bridge against BluTB 15x S/L clams BluTB 15/15 Bridge with ball 15x Therapeutic Activity: Seated hamstring stretch 30s B Supine QL stretch 30s x2 B P-ball curl  ups 15x B, 15/15 unilaterally     [facility] Adult PT Treatment:                                                DATE: 07/01/24 Therapeutic Exercise: Nustep L4 6 min 90/90 60s  Neuromuscular re-ed: Quadruped UE 10/10 Quadruped LE 10/10 Bird dog 10/10 Therapeutic Activity: P-ball curl ups 15x B, 15/15 unilaterally PPT with alt march 10x Prone press up 5x f/b, 5x with OP                                                                                                                 [facility] Adult PT Treatment:                                                DATE: 06/10/24 Eval and HEP Self Care: Additional minutes spent for educating on updated Therapeutic Home Exercise Program as well as comparing current status to condition at start of symptoms. This included exercises focusing on stretching, strengthening, with focus on eccentric aspects. Long term goals include an improvement in range of motion, strength, endurance as well as avoiding reinjury. Patient's frequency would include in 1-2 times a day, 3-5 times a week for a duration of 6-12 weeks. Proper technique shown and discussed handout in great detail. All questions were discussed and addressed.      PATIENT EDUCATION:  Education details: Discussed eval findings, rehab rationale and POC and patient is in agreement  Person educated: Patient Education method: Explanation and Handouts Education comprehension: verbalized understanding and needs further education  HOME EXERCISE PROGRAM: Access Code: [email or web address] URL: [email or web address] Date: 07/15/2024 Prepared by: Kamya Watling  Exercises - Prone Press Up  - 1-2 x daily - 3 x weekly - 1 sets - 10 reps - Bird Dog  - 1-2 x daily - 3 x weekly - 1 sets - 10 reps - Clamshell with Resistance  - 1-2 x daily - 3 x weekly - 2 sets - 15 reps - Hooklying Single Leg Bent Knee Fallouts with Resistance  - 1-2 x daily - 3 x weekly - 2 sets - 15 reps - Plank on Knees  - 1-2 x daily - 3 x weekly  - 3 sets - 10 reps - 30s hold - Hip Flexor Stretch at Edge of Bed  - 1-2 x daily - 3 x weekly - 1 sets - 2 reps - 30s hold  ASSESSMENT:  CLINICAL IMPRESSION: Focus of today was  HEP review and update, assessment of goal progress and re certification due to progress made but goals not yet met.  See goal section for progress.   Patient is a 43 y.o. 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  No radicular symptoms present, trunk ROM WNL.  Weakness identified in core and hip extensor muscles.  She is currently exercising at [employer] and enjoys Pilates.  Patient will attend PT 1x.week until confident in self management techniques.  OBJECTIVE IMPAIRMENTS: decreased activity tolerance, decreased knowledge of condition, decreased mobility, decreased strength, increased fascial restrictions, impaired perceived functional ability, improper body mechanics, obesity, and pain.   ACTIVITY LIMITATIONS: carrying, lifting, bending, sitting, standing, sleeping, and bed mobility  PERSONAL FACTORS: Age, Past/current experiences, and Time since onset of injury/illness/exacerbation are also affecting patient's functional outcome.   REHAB POTENTIAL: Fair based on chronicity  CLINICAL DECISION MAKING: Stable/uncomplicated  EVALUATION COMPLEXITY: Low   GOALS: Goals reviewed with patient? No   SHORT TERM GOALS=LONG TERM GOALS: Target date: 10/05/2024    Patient will acknowledge 4/10 worst pain at least once during episode of care   Baseline: 8/10; 08/05/24 3-4/10 at best. Goal status: INITIAL  2.  Patient will increase 30s chair stand reps from 4 to 10 without arms to demonstrate and improved functional ability with less pain/difficulty as well as reduce fall risk.  Baseline: 4; 08/05/24 10 Goal status: INITIAL  3.  Patient will score at least 11/50 on ODI to signify clinically meaningful improvement in functional  abilities.   Baseline: 19/50; 08/05/24 16/50 Goal status: Ongoing  4.  Patient to demonstrate independence in HEP  Baseline: [id number] Goal status: Met  5.  Patient to demo a painfree bridge Baseline: Unable to bridge due to pain Goal status: Ongoing  6.  Patient to hold plank on toes 30s Baseline: 10s Goal status: INITIAL  7.  Patient to demonstrate a full prone press up Baseline: 90% Goal status: INITIAL     PLAN:  PT FREQUENCY: 1x/week  PT DURATION: 6 weeks  PLANNED INTERVENTIONS: 97110-Therapeutic exercises, 97530- Therapeutic activity, 97112- Neuromuscular re-education, 97535- Self Care, and 02859- Manual therapy.  PLAN FOR NEXT SESSION: HEP review and update, manual techniques as appropriate, aerobic tasks, ROM and flexibility activities, strengthening and PREs, TPDN, gait and balance training as needed   For all possible CPT codes, reference the Planned Interventions line above.     Check all conditions that are expected to impact treatment: {Conditions expected to impact treatment:Structural or anatomic abnormalities and Current pregnancy or recent postpartum   If treatment provided at initial evaluation, no treatment charged due to lack of authorization.       Shaneil Yazdi M Akisha Sturgill, PT 08/09/2024, 12:40 PM

## 2024-08-11 ENCOUNTER — Ambulatory Visit

## 2024-08-18 NOTE — Therapy (Unsigned)
 OUTPATIENT PHYSICAL THERAPY TREATMENT NOTE/RE-CERT   Patient Name: Heather Kelly MRN: [id number] DOB:03-09-1981, 43 y.o., female Today's Date: 08/19/2024  END OF SESSION:  PT End of Session - 08/19/24 1319     Visit Number 6    Number of Visits 12    Date for PT Re-Evaluation 10/05/24    Authorization Type MCD    Authorization - Visit Number 6    Authorization - Number of Visits 27    PT Start Time 1320    PT Stop Time 1400    PT Time Calculation (min) 40 min    Activity Tolerance Patient tolerated treatment well    Behavior During Therapy [facility] for tasks assessed/performed               Past Medical History:  Diagnosis Date   Hypothyroidism    Sleep apnea    Past Surgical History:  Procedure Laterality Date   BARIATRIC SURGERY  04/10/2021   CESAREAN SECTION  2011   CESAREAN SECTION  2007   Patient Active Problem List   Diagnosis Date Noted   OSA on CPAP 01/23/2021    PCP: Loreli Kins, MD   REFERRING PROVIDER: Rosalea Rosina SAILOR, PA  REFERRING DIAG: LOW BACK PAIN ONGOING AFTER PREGNANCY  Rationale for Evaluation and Treatment: Rehabilitation  THERAPY DIAG:  Other low back pain  Muscle weakness (generalized)  HNP (herniated nucleus pulposus), lumbar  ONSET DATE: chronic  SUBJECTIVE:                                                                                                                                                                                           SUBJECTIVE STATEMENT: Low back pain minimal since last session  PERTINENT HISTORY:    PAIN:  Are you having pain? Yes: NPRS scale: 2-8/10 Pain location: low back Pain description: ache Aggravating factors: prolonged sitting/standing, bending and lifting Relieving factors: position changes  PRECAUTIONS: None  RED FLAGS: None   WEIGHT BEARING RESTRICTIONS: No  FALLS:  Has patient fallen in last 6 months? No  OCCUPATION: not working  PLOF: Independent  PATIENT GOALS:  To manage my back pain  NEXT MD VISIT: TBD  OBJECTIVE:  Note: Objective measures were completed at Evaluation unless otherwise noted.  DIAGNOSTIC FINDINGS:  IMPRESSION: 1. L5-S1 large right paracentral disc protrusion, which contacts both descending S1 nerve roots and causes mild spinal canal stenosis. 2. Mild bilateral hydronephrosis. 3. No acute fracture or traumatic listhesis in the thoracic or lumbar spine.     Electronically Signed   By: Donald Campion M.D.   On: 09/27/2022 19:12  PATIENT SURVEYS:  Modified  Oswestry: 19/50 38% perceived disability; 08/05/24 16/50  Interpretation of scores: Score Category Description  0-20% Minimal Disability The patient can cope with most living activities. Usually no treatment is indicated apart from advice on lifting, sitting and exercise  21-40% Moderate Disability The patient experiences more pain and difficulty with sitting, lifting and standing. Travel and social life are more difficult and they may be disabled from work. Personal care, sexual activity and sleeping are not grossly affected, and the patient can usually be managed by conservative means  41-60% Severe Disability Pain remains the main problem in this group, but activities of daily living are affected. These patients require a detailed investigation  61-80% Crippled Back pain impinges on all aspects of the patient's life. Positive intervention is required  81-100% Bed-bound  These patients are either bed-bound or exaggerating their symptoms  Bluford FORBES Zoe DELENA Karon DELENA, et al. Surgery versus conservative management of stable thoracolumbar fracture: the PRESTO feasibility RCT. [place] ([place]): [employer]; 2021 Nov. [facility] Technology Assessment, No. 25.62.) Appendix 3, Oswestry Disability Index category descriptors. Available from: FindJewelers.cz  Minimally Clinically Important Difference (MCID) = 12.8%   MUSCLE LENGTH: Hamstrings:  WNL Thomas test: tight quads L>R  POSTURE: No Significant postural limitations  PALPATION: deferred  LUMBAR ROM:   AROM eval  Flexion 100%  Extension 100%  Right lateral flexion 100%  Left lateral flexion 100%  Right rotation   Left rotation    (Blank rows = not tested)  LOWER EXTREMITY ROM:   WNL  Active  Right eval Left eval  Hip flexion    Hip extension    Hip abduction    Hip adduction    Hip internal rotation    Hip external rotation    Knee flexion    Knee extension    Ankle dorsiflexion    Ankle plantarflexion    Ankle inversion    Ankle eversion     (Blank rows = not tested)  LOWER EXTREMITY MMT:    MMT Right eval Left eval  Hip flexion    Hip extension 4 4  Hip abduction    Hip adduction    Hip internal rotation    Hip external rotation    Knee flexion    Knee extension    Ankle dorsiflexion    Ankle plantarflexion    Ankle inversion    Ankle eversion     (Blank rows = not tested)  LUMBAR SPECIAL TESTS:  Straight leg raise test: Negative, Slump test: Negative, and FABER test: Negative  FUNCTIONAL TESTS:  30s chair stand 4; 08/05/24 8  GAIT: Distance walked: 11ft x2 Assistive device utilized: None Level of assistance: Complete Independence Comments: unremarkable  TREATMENT:      [facility] Adult PT Treatment:                                                DATE: 08/19/24 Therapeutic Exercise: Nustep L6 8 min Neuromuscular re-ed: Goblet squats 15# 15x from airex pad  Supine hip fallouts BlaTB 15x B, 15/15 Bridge against BlaTB Therapeutic Activity: Runners step 6 in 15/15 Heel raises from 4 in step 15x   [facility] Adult PT Treatment:  DATE: 08/05/24 Therapeutic Exercise: Nustep L 5 8 min Neuromuscular re-ed: Plank on toes 10s Plank on knees with hip extension 10s Goblet squats 10# 10x Side step against BluTB 46ftx2 Therapeutic Activity: Prone press 10x Bird dog 5/5 Plank on knees 30s ODI  re-take and assessment of progress towards goals for re-cert  [facility] Adult PT Treatment:                                                DATE: 07/15/24 Therapeutic Exercise: Nustep L5 8 min Neuromuscular re-ed: Supine hip fallouts BlaTB 10x B, 10/10 unilaterally SL bridge 15/15 S/L clams BlaTB 10/10 Prone press 5x f/b 5x with PT OP Therapeutic Activity: Goblet squats 10# KB 10x Plank on knees 30s x2 Hip flexor stretch 30x  [facility] Adult PT Treatment:                                                DATE: 07/07/24 Therapeutic Exercise: Nustep L4 8 min Neuromuscular re-ed: Supine hip fallouts BluTB 15x B, 15/15 unilaterally Bridge against BluTB 15x S/L clams BluTB 15/15 Bridge with ball 15x Therapeutic Activity: Seated hamstring stretch 30s B Supine QL stretch 30s x2 B P-ball curl ups 15x B, 15/15 unilaterally     [facility] Adult PT Treatment:                                                DATE: 07/01/24 Therapeutic Exercise: Nustep L4 6 min 90/90 60s  Neuromuscular re-ed: Quadruped UE 10/10 Quadruped LE 10/10 Bird dog 10/10 Therapeutic Activity: P-ball curl ups 15x B, 15/15 unilaterally PPT with alt march 10x Prone press up 5x f/b, 5x with OP                                                                                                                 [facility] Adult PT Treatment:                                                DATE: 06/10/24 Eval and HEP Self Care: Additional minutes spent for educating on updated Therapeutic Home Exercise Program as well as comparing current status to condition at start of symptoms. This included exercises focusing on stretching, strengthening, with focus on eccentric aspects. Long term goals include an improvement in range of motion, strength, endurance as well as avoiding reinjury. Patient's frequency would include in 1-2 times a day, 3-5 times a week for a duration of 6-12 weeks. Proper technique shown  and discussed handout in great detail. All questions  were discussed and addressed.      PATIENT EDUCATION:  Education details: Discussed eval findings, rehab rationale and POC and patient is in agreement  Person educated: Patient Education method: Explanation and Handouts Education comprehension: verbalized understanding and needs further education  HOME EXERCISE PROGRAM: Access Code: [email or web address] URL: [email or web address] Date: 07/15/2024 Prepared by: Kenichi Cassada  Exercises - Prone Press Up  - 1-2 x daily - 3 x weekly - 1 sets - 10 reps - Bird Dog  - 1-2 x daily - 3 x weekly - 1 sets - 10 reps - Clamshell with Resistance  - 1-2 x daily - 3 x weekly - 2 sets - 15 reps - Hooklying Single Leg Bent Knee Fallouts with Resistance  - 1-2 x daily - 3 x weekly - 2 sets - 15 reps - Plank on Knees  - 1-2 x daily - 3 x weekly - 3 sets - 10 reps - 30s hold - Hip Flexor Stretch at Edge of Bed  - 1-2 x daily - 3 x weekly - 1 sets - 2 reps - 30s hold  ASSESSMENT:  CLINICAL IMPRESSION: Increased resistance on Tband tasks.  Emphasis of today was LE strengthening and body mechanic, addressing B hip abduction weakness.   Patient is a 43 y.o. 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  No radicular symptoms present, trunk ROM WNL.  Weakness identified in core and hip extensor muscles.  She is currently exercising at [employer] and enjoys Pilates.  Patient will attend PT 1x.week until confident in self management techniques.  OBJECTIVE IMPAIRMENTS: decreased activity tolerance, decreased knowledge of condition, decreased mobility, decreased strength, increased fascial restrictions, impaired perceived functional ability, improper body mechanics, obesity, and pain.   ACTIVITY LIMITATIONS: carrying, lifting, bending, sitting, standing, sleeping, and bed mobility  PERSONAL FACTORS: Age, Past/current experiences, and Time since onset of  injury/illness/exacerbation are also affecting patient's functional outcome.   REHAB POTENTIAL: Fair based on chronicity  CLINICAL DECISION MAKING: Stable/uncomplicated  EVALUATION COMPLEXITY: Low   GOALS: Goals reviewed with patient? No   SHORT TERM GOALS=LONG TERM GOALS: Target date: 10/05/2024    Patient will acknowledge 4/10 worst pain at least once during episode of care   Baseline: 8/10; 08/05/24 3-4/10 at best. Goal status: INITIAL  2.  Patient will increase 30s chair stand reps from 4 to 10 without arms to demonstrate and improved functional ability with less pain/difficulty as well as reduce fall risk.  Baseline: 4; 08/05/24 10 Goal status: INITIAL  3.  Patient will score at least 11/50 on ODI to signify clinically meaningful improvement in functional abilities.   Baseline: 19/50; 08/05/24 16/50 Goal status: Ongoing  4.  Patient to demonstrate independence in HEP  Baseline: [id number] Goal status: Met  5.  Patient to demo a painfree bridge Baseline: Unable to bridge due to pain Goal status: Ongoing  6.  Patient to hold plank on toes 30s Baseline: 10s Goal status: INITIAL  7.  Patient to demonstrate a full prone press up Baseline: 90% Goal status: INITIAL     PLAN:  PT FREQUENCY: 1x/week  PT DURATION: 6 weeks  PLANNED INTERVENTIONS: 97110-Therapeutic exercises, 97530- Therapeutic activity, 97112- Neuromuscular re-education, 97535- Self Care, and 02859- Manual therapy.  PLAN FOR NEXT SESSION: HEP review and update, manual techniques as appropriate, aerobic tasks, ROM and flexibility activities, strengthening and PREs, TPDN, gait and  balance training as needed   For all possible CPT codes, reference the Planned Interventions line above.     Check all conditions that are expected to impact treatment: {Conditions expected to impact treatment:Structural or anatomic abnormalities and Current pregnancy or recent postpartum   If treatment provided at initial  evaluation, no treatment charged due to lack of authorization.       Juvenal Umar M Chaz Ronning, PT 08/19/2024, 2:15 PM

## 2024-08-19 ENCOUNTER — Ambulatory Visit: Attending: Physician Assistant

## 2024-08-19 DIAGNOSIS — M5459 Other low back pain: Secondary | ICD-10-CM | POA: Diagnosis present

## 2024-08-19 DIAGNOSIS — M6281 Muscle weakness (generalized): Secondary | ICD-10-CM | POA: Insufficient documentation

## 2024-08-19 DIAGNOSIS — M5126 Other intervertebral disc displacement, lumbar region: Secondary | ICD-10-CM | POA: Diagnosis present

## 2024-08-23 NOTE — Therapy (Deleted)
 OUTPATIENT PHYSICAL THERAPY TREATMENT NOTE/RE-CERT   Patient Name: Heather Kelly MRN: [id number] DOB:July 28, 1981, 43 y.o., female Today's Date: 08/23/2024  END OF SESSION:         Past Medical History:  Diagnosis Date   Hypothyroidism    Sleep apnea    Past Surgical History:  Procedure Laterality Date   BARIATRIC SURGERY  04/10/2021   CESAREAN SECTION  2011   CESAREAN SECTION  2007   Patient Active Problem List   Diagnosis Date Noted   OSA on CPAP 01/23/2021    PCP: Loreli Kins, MD   REFERRING PROVIDER: Rosalea Rosina SAILOR, PA  REFERRING DIAG: LOW BACK PAIN ONGOING AFTER PREGNANCY  Rationale for Evaluation and Treatment: Rehabilitation  THERAPY DIAG:  No diagnosis found.  ONSET DATE: chronic  SUBJECTIVE:                                                                                                                                                                                           SUBJECTIVE STATEMENT: Low back pain minimal since last session  PERTINENT HISTORY:    PAIN:  Are you having pain? Yes: NPRS scale: 2-8/10 Pain location: low back Pain description: ache Aggravating factors: prolonged sitting/standing, bending and lifting Relieving factors: position changes  PRECAUTIONS: None  RED FLAGS: None   WEIGHT BEARING RESTRICTIONS: No  FALLS:  Has patient fallen in last 6 months? No  OCCUPATION: not working  PLOF: Independent  PATIENT GOALS: To manage my back pain  NEXT MD VISIT: TBD  OBJECTIVE:  Note: Objective measures were completed at Evaluation unless otherwise noted.  DIAGNOSTIC FINDINGS:  IMPRESSION: 1. L5-S1 large right paracentral disc protrusion, which contacts both descending S1 nerve roots and causes mild spinal canal stenosis. 2. Mild bilateral hydronephrosis. 3. No acute fracture or traumatic listhesis in the thoracic or lumbar spine.     Electronically Signed   By: Donald Campion M.D.   On: 09/27/2022  19:12  PATIENT SURVEYS:  Modified Oswestry: 19/50 38% perceived disability; 08/05/24 16/50  Interpretation of scores: Score Category Description  0-20% Minimal Disability The patient can cope with most living activities. Usually no treatment is indicated apart from advice on lifting, sitting and exercise  21-40% Moderate Disability The patient experiences more pain and difficulty with sitting, lifting and standing. Travel and social life are more difficult and they may be disabled from work. Personal care, sexual activity and sleeping are not grossly affected, and the patient can usually be managed by conservative means  41-60% Severe Disability Pain remains the main problem in this group, but activities of daily living are affected. These patients require a  detailed investigation  61-80% Crippled Back pain impinges on all aspects of the patient's life. Positive intervention is required  81-100% Bed-bound  These patients are either bed-bound or exaggerating their symptoms  Bluford FORBES Zoe DELENA Karon DELENA, et al. Surgery versus conservative management of stable thoracolumbar fracture: the PRESTO feasibility RCT. [place]): [employer]; 2021 Nov. [facility] Technology Assessment, No. 25.62.) Appendix 3, Oswestry Disability Index category descriptors. Available from: FindJewelers.cz  Minimally Clinically Important Difference (MCID) = 12.8%   MUSCLE LENGTH: Hamstrings: WNL Thomas test: tight quads L>R  POSTURE: No Significant postural limitations  PALPATION: deferred  LUMBAR ROM:   AROM eval  Flexion 100%  Extension 100%  Right lateral flexion 100%  Left lateral flexion 100%  Right rotation   Left rotation    (Blank rows = not tested)  LOWER EXTREMITY ROM:   WNL  Active  Right eval Left eval  Hip flexion    Hip extension    Hip abduction    Hip adduction    Hip internal rotation    Hip external rotation    Knee flexion    Knee  extension    Ankle dorsiflexion    Ankle plantarflexion    Ankle inversion    Ankle eversion     (Blank rows = not tested)  LOWER EXTREMITY MMT:    MMT Right eval Left eval  Hip flexion    Hip extension 4 4  Hip abduction    Hip adduction    Hip internal rotation    Hip external rotation    Knee flexion    Knee extension    Ankle dorsiflexion    Ankle plantarflexion    Ankle inversion    Ankle eversion     (Blank rows = not tested)  LUMBAR SPECIAL TESTS:  Straight leg raise test: Negative, Slump test: Negative, and FABER test: Negative  FUNCTIONAL TESTS:  30s chair stand 4; 08/05/24 8  GAIT: Distance walked: 39ft x2 Assistive device utilized: None Level of assistance: Complete Independence Comments: unremarkable  TREATMENT:      [facility] Adult PT Treatment:                                                DATE: 08/19/24 Therapeutic Exercise: Nustep L6 8 min Neuromuscular re-ed: Goblet squats 15# 15x from airex pad  Supine hip fallouts BlaTB 15x B, 15/15 Bridge against BlaTB Therapeutic Activity: Runners step 6 in 15/15 Heel raises from 4 in step 15x   [facility] Adult PT Treatment:                                                DATE: 08/05/24 Therapeutic Exercise: Nustep L 5 8 min Neuromuscular re-ed: Plank on toes 10s Plank on knees with hip extension 10s Goblet squats 10# 10x Side step against BluTB 52ftx2 Therapeutic Activity: Prone press 10x Bird dog 5/5 Plank on knees 30s ODI re-take and assessment of progress towards goals for re-cert  [facility] Adult PT Treatment:  DATE: 07/15/24 Therapeutic Exercise: Nustep L5 8 min Neuromuscular re-ed: Supine hip fallouts BlaTB 10x B, 10/10 unilaterally SL bridge 15/15 S/L clams BlaTB 10/10 Prone press 5x f/b 5x with PT OP Therapeutic Activity: Goblet squats 10# KB 10x Plank on knees 30s x2 Hip flexor stretch 30x  [facility] Adult PT Treatment:                                                 DATE: 07/07/24 Therapeutic Exercise: Nustep L4 8 min Neuromuscular re-ed: Supine hip fallouts BluTB 15x B, 15/15 unilaterally Bridge against BluTB 15x S/L clams BluTB 15/15 Bridge with ball 15x Therapeutic Activity: Seated hamstring stretch 30s B Supine QL stretch 30s x2 B P-ball curl ups 15x B, 15/15 unilaterally     [facility] Adult PT Treatment:                                                DATE: 07/01/24 Therapeutic Exercise: Nustep L4 6 min 90/90 60s  Neuromuscular re-ed: Quadruped UE 10/10 Quadruped LE 10/10 Bird dog 10/10 Therapeutic Activity: P-ball curl ups 15x B, 15/15 unilaterally PPT with alt march 10x Prone press up 5x f/b, 5x with OP                                                                                                                 [facility] Adult PT Treatment:                                                DATE: 06/10/24 Eval and HEP Self Care: Additional minutes spent for educating on updated Therapeutic Home Exercise Program as well as comparing current status to condition at start of symptoms. This included exercises focusing on stretching, strengthening, with focus on eccentric aspects. Long term goals include an improvement in range of motion, strength, endurance as well as avoiding reinjury. Patient's frequency would include in 1-2 times a day, 3-5 times a week for a duration of 6-12 weeks. Proper technique shown and discussed handout in great detail. All questions were discussed and addressed.      PATIENT EDUCATION:  Education details: Discussed eval findings, rehab rationale and POC and patient is in agreement  Person educated: Patient Education method: Explanation and Handouts Education comprehension: verbalized understanding and needs further education  HOME EXERCISE PROGRAM: Access Code: [email or web address] URL: [email or web address] Date: 07/15/2024 Prepared by: Jevon Shells  Exercises - Prone Press Up  - 1-2 x daily - 3 x weekly  - 1 sets - 10 reps - Bird Dog  - 1-2 x daily - 3 x weekly - 1 sets - 10  reps - Clamshell with Resistance  - 1-2 x daily - 3 x weekly - 2 sets - 15 reps - Hooklying Single Leg Bent Knee Fallouts with Resistance  - 1-2 x daily - 3 x weekly - 2 sets - 15 reps - Plank on Knees  - 1-2 x daily - 3 x weekly - 3 sets - 10 reps - 30s hold - Hip Flexor Stretch at Edge of Bed  - 1-2 x daily - 3 x weekly - 1 sets - 2 reps - 30s hold  ASSESSMENT:  CLINICAL IMPRESSION: Increased resistance on Tband tasks.  Emphasis of today was LE strengthening and body mechanic, addressing B hip abduction weakness.   Patient is a 43 y.o. 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  No radicular symptoms present, trunk ROM WNL.  Weakness identified in core and hip extensor muscles.  She is currently exercising at [employer] and enjoys Pilates.  Patient will attend PT 1x.week until confident in self management techniques.  OBJECTIVE IMPAIRMENTS: decreased activity tolerance, decreased knowledge of condition, decreased mobility, decreased strength, increased fascial restrictions, impaired perceived functional ability, improper body mechanics, obesity, and pain.   ACTIVITY LIMITATIONS: carrying, lifting, bending, sitting, standing, sleeping, and bed mobility  PERSONAL FACTORS: Age, Past/current experiences, and Time since onset of injury/illness/exacerbation are also affecting patient's functional outcome.   REHAB POTENTIAL: Fair based on chronicity  CLINICAL DECISION MAKING: Stable/uncomplicated  EVALUATION COMPLEXITY: Low   GOALS: Goals reviewed with patient? No   SHORT TERM GOALS=LONG TERM GOALS: Target date: 10/05/2024    Patient will acknowledge 4/10 worst pain at least once during episode of care   Baseline: 8/10; 08/05/24 3-4/10 at best. Goal status: INITIAL  2.  Patient will increase 30s chair stand reps from 4 to 10  without arms to demonstrate and improved functional ability with less pain/difficulty as well as reduce fall risk.  Baseline: 4; 08/05/24 10 Goal status: INITIAL  3.  Patient will score at least 11/50 on ODI to signify clinically meaningful improvement in functional abilities.   Baseline: 19/50; 08/05/24 16/50 Goal status: Ongoing  4.  Patient to demonstrate independence in HEP  Baseline: [id number] Goal status: Met  5.  Patient to demo a painfree bridge Baseline: Unable to bridge due to pain Goal status: Ongoing  6.  Patient to hold plank on toes 30s Baseline: 10s Goal status: INITIAL  7.  Patient to demonstrate a full prone press up Baseline: 90% Goal status: INITIAL     PLAN:  PT FREQUENCY: 1x/week  PT DURATION: 6 weeks  PLANNED INTERVENTIONS: 97110-Therapeutic exercises, 97530- Therapeutic activity, 97112- Neuromuscular re-education, 97535- Self Care, and 02859- Manual therapy.  PLAN FOR NEXT SESSION: HEP review and update, manual techniques as appropriate, aerobic tasks, ROM and flexibility activities, strengthening and PREs, TPDN, gait and balance training as needed   For all possible CPT codes, reference the Planned Interventions line above.     Check all conditions that are expected to impact treatment: {Conditions expected to impact treatment:Structural or anatomic abnormalities and Current pregnancy or recent postpartum   If treatment provided at initial evaluation, no treatment charged due to lack of authorization.       Maryori Weide M [place] Doig, PT 08/23/2024, 9:30 AM

## 2024-08-24 ENCOUNTER — Ambulatory Visit

## 2024-08-30 NOTE — Therapy (Unsigned)
 OUTPATIENT PHYSICAL THERAPY TREATMENT NOTE/RE-CERT   Patient Name: Heather Kelly MRN: [id number] DOB:12/26/1980, 43 y.o., female Today's Date: 08/30/2024  END OF SESSION:         Past Medical History:  Diagnosis Date   Hypothyroidism    Sleep apnea    Past Surgical History:  Procedure Laterality Date   BARIATRIC SURGERY  04/10/2021   CESAREAN SECTION  2011   CESAREAN SECTION  2007   Patient Active Problem List   Diagnosis Date Noted   OSA on CPAP 01/23/2021    PCP: Loreli Kins, MD   REFERRING PROVIDER: Rosalea Rosina SAILOR, PA  REFERRING DIAG: LOW BACK PAIN ONGOING AFTER PREGNANCY  Rationale for Evaluation and Treatment: Rehabilitation  THERAPY DIAG:  No diagnosis found.  ONSET DATE: chronic  SUBJECTIVE:                                                                                                                                                                                           SUBJECTIVE STATEMENT: Low back pain minimal since last session  PERTINENT HISTORY:    PAIN:  Are you having pain? Yes: NPRS scale: 2-8/10 Pain location: low back Pain description: ache Aggravating factors: prolonged sitting/standing, bending and lifting Relieving factors: position changes  PRECAUTIONS: None  RED FLAGS: None   WEIGHT BEARING RESTRICTIONS: No  FALLS:  Has patient fallen in last 6 months? No  OCCUPATION: not working  PLOF: Independent  PATIENT GOALS: To manage my back pain  NEXT MD VISIT: TBD  OBJECTIVE:  Note: Objective measures were completed at Evaluation unless otherwise noted.  DIAGNOSTIC FINDINGS:  IMPRESSION: 1. L5-S1 large right paracentral disc protrusion, which contacts both descending S1 nerve roots and causes mild spinal canal stenosis. 2. Mild bilateral hydronephrosis. 3. No acute fracture or traumatic listhesis in the thoracic or lumbar spine.     Electronically Signed   By: Donald Campion M.D.   On: 09/27/2022  19:12  PATIENT SURVEYS:  Modified Oswestry: 19/50 38% perceived disability; 08/05/24 16/50  Interpretation of scores: Score Category Description  0-20% Minimal Disability The patient can cope with most living activities. Usually no treatment is indicated apart from advice on lifting, sitting and exercise  21-40% Moderate Disability The patient experiences more pain and difficulty with sitting, lifting and standing. Travel and social life are more difficult and they may be disabled from work. Personal care, sexual activity and sleeping are not grossly affected, and the patient can usually be managed by conservative means  41-60% Severe Disability Pain remains the main problem in this group, but activities of daily living are affected. These patients require a  detailed investigation  61-80% Crippled Back pain impinges on all aspects of the patient's life. Positive intervention is required  81-100% Bed-bound  These patients are either bed-bound or exaggerating their symptoms  Bluford FORBES Zoe DELENA Karon DELENA, et al. Surgery versus conservative management of stable thoracolumbar fracture: the PRESTO feasibility RCT. [place] ([place]): [employer]; 2021 Nov. [facility] Technology Assessment, No. 25.62.) Appendix 3, Oswestry Disability Index category descriptors. Available from: FindJewelers.cz  Minimally Clinically Important Difference (MCID) = 12.8%   MUSCLE LENGTH: Hamstrings: WNL Thomas test: tight quads L>R  POSTURE: No Significant postural limitations  PALPATION: deferred  LUMBAR ROM:   AROM eval  Flexion 100%  Extension 100%  Right lateral flexion 100%  Left lateral flexion 100%  Right rotation   Left rotation    (Blank rows = not tested)  LOWER EXTREMITY ROM:   WNL  Active  Right eval Left eval  Hip flexion    Hip extension    Hip abduction    Hip adduction    Hip internal rotation    Hip external rotation    Knee flexion    Knee  extension    Ankle dorsiflexion    Ankle plantarflexion    Ankle inversion    Ankle eversion     (Blank rows = not tested)  LOWER EXTREMITY MMT:    MMT Right eval Left eval  Hip flexion    Hip extension 4 4  Hip abduction    Hip adduction    Hip internal rotation    Hip external rotation    Knee flexion    Knee extension    Ankle dorsiflexion    Ankle plantarflexion    Ankle inversion    Ankle eversion     (Blank rows = not tested)  LUMBAR SPECIAL TESTS:  Straight leg raise test: Negative, Slump test: Negative, and FABER test: Negative  FUNCTIONAL TESTS:  30s chair stand 4; 08/05/24 8  GAIT: Distance walked: 64ft x2 Assistive device utilized: None Level of assistance: Complete Independence Comments: unremarkable  TREATMENT:      [facility] Adult PT Treatment:                                                DATE: 08/19/24 Therapeutic Exercise: Nustep L6 8 min Neuromuscular re-ed: Goblet squats 15# 15x from airex pad  Supine hip fallouts BlaTB 15x B, 15/15 Bridge against BlaTB Therapeutic Activity: Runners step 6 in 15/15 Heel raises from 4 in step 15x   [facility] Adult PT Treatment:                                                DATE: 08/05/24 Therapeutic Exercise: Nustep L 5 8 min Neuromuscular re-ed: Plank on toes 10s Plank on knees with hip extension 10s Goblet squats 10# 10x Side step against BluTB 85ftx2 Therapeutic Activity: Prone press 10x Bird dog 5/5 Plank on knees 30s ODI re-take and assessment of progress towards goals for re-cert  [facility] Adult PT Treatment:  DATE: 07/15/24 Therapeutic Exercise: Nustep L5 8 min Neuromuscular re-ed: Supine hip fallouts BlaTB 10x B, 10/10 unilaterally SL bridge 15/15 S/L clams BlaTB 10/10 Prone press 5x f/b 5x with PT OP Therapeutic Activity: Goblet squats 10# KB 10x Plank on knees 30s x2 Hip flexor stretch 30x  [facility] Adult PT Treatment:                                                 DATE: 07/07/24 Therapeutic Exercise: Nustep L4 8 min Neuromuscular re-ed: Supine hip fallouts BluTB 15x B, 15/15 unilaterally Bridge against BluTB 15x S/L clams BluTB 15/15 Bridge with ball 15x Therapeutic Activity: Seated hamstring stretch 30s B Supine QL stretch 30s x2 B P-ball curl ups 15x B, 15/15 unilaterally     [facility] Adult PT Treatment:                                                DATE: 07/01/24 Therapeutic Exercise: Nustep L4 6 min 90/90 60s  Neuromuscular re-ed: Quadruped UE 10/10 Quadruped LE 10/10 Bird dog 10/10 Therapeutic Activity: P-ball curl ups 15x B, 15/15 unilaterally PPT with alt march 10x Prone press up 5x f/b, 5x with OP                                                                                                                 [facility] Adult PT Treatment:                                                DATE: 06/10/24 Eval and HEP Self Care: Additional minutes spent for educating on updated Therapeutic Home Exercise Program as well as comparing current status to condition at start of symptoms. This included exercises focusing on stretching, strengthening, with focus on eccentric aspects. Long term goals include an improvement in range of motion, strength, endurance as well as avoiding reinjury. Patient's frequency would include in 1-2 times a day, 3-5 times a week for a duration of 6-12 weeks. Proper technique shown and discussed handout in great detail. All questions were discussed and addressed.      PATIENT EDUCATION:  Education details: Discussed eval findings, rehab rationale and POC and patient is in agreement  Person educated: Patient Education method: Explanation and Handouts Education comprehension: verbalized understanding and needs further education  HOME EXERCISE PROGRAM: Access Code: [email or web address] URL: [email or web address] Date: 07/15/2024 Prepared by: Drinda Belgard  Exercises - Prone Press Up  - 1-2 x daily - 3 x weekly  - 1 sets - 10 reps - Bird Dog  - 1-2 x daily - 3 x weekly - 1 sets - 10  reps - Clamshell with Resistance  - 1-2 x daily - 3 x weekly - 2 sets - 15 reps - Hooklying Single Leg Bent Knee Fallouts with Resistance  - 1-2 x daily - 3 x weekly - 2 sets - 15 reps - Plank on Knees  - 1-2 x daily - 3 x weekly - 3 sets - 10 reps - 30s hold - Hip Flexor Stretch at Edge of Bed  - 1-2 x daily - 3 x weekly - 1 sets - 2 reps - 30s hold  ASSESSMENT:  CLINICAL IMPRESSION: Increased resistance on Tband tasks.  Emphasis of today was LE strengthening and body mechanic, addressing B hip abduction weakness.   Patient is a 43 y.o. 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  No radicular symptoms present, trunk ROM WNL.  Weakness identified in core and hip extensor muscles.  She is currently exercising at [employer] and enjoys Pilates.  Patient will attend PT 1x.week until confident in self management techniques.  OBJECTIVE IMPAIRMENTS: decreased activity tolerance, decreased knowledge of condition, decreased mobility, decreased strength, increased fascial restrictions, impaired perceived functional ability, improper body mechanics, obesity, and pain.   ACTIVITY LIMITATIONS: carrying, lifting, bending, sitting, standing, sleeping, and bed mobility  PERSONAL FACTORS: Age, Past/current experiences, and Time since onset of injury/illness/exacerbation are also affecting patient's functional outcome.   REHAB POTENTIAL: Fair based on chronicity  CLINICAL DECISION MAKING: Stable/uncomplicated  EVALUATION COMPLEXITY: Low   GOALS: Goals reviewed with patient? No   SHORT TERM GOALS=LONG TERM GOALS: Target date: 10/05/2024    Patient will acknowledge 4/10 worst pain at least once during episode of care   Baseline: 8/10; 08/05/24 3-4/10 at best. Goal status: INITIAL  2.  Patient will increase 30s chair stand reps from 4 to 10  without arms to demonstrate and improved functional ability with less pain/difficulty as well as reduce fall risk.  Baseline: 4; 08/05/24 10 Goal status: INITIAL  3.  Patient will score at least 11/50 on ODI to signify clinically meaningful improvement in functional abilities.   Baseline: 19/50; 08/05/24 16/50 Goal status: Ongoing  4.  Patient to demonstrate independence in HEP  Baseline: [id number] Goal status: Met  5.  Patient to demo a painfree bridge Baseline: Unable to bridge due to pain Goal status: Ongoing  6.  Patient to hold plank on toes 30s Baseline: 10s Goal status: INITIAL  7.  Patient to demonstrate a full prone press up Baseline: 90% Goal status: INITIAL     PLAN:  PT FREQUENCY: 1x/week  PT DURATION: 6 weeks  PLANNED INTERVENTIONS: 97110-Therapeutic exercises, 97530- Therapeutic activity, 97112- Neuromuscular re-education, 97535- Self Care, and 02859- Manual therapy.  PLAN FOR NEXT SESSION: HEP review and update, manual techniques as appropriate, aerobic tasks, ROM and flexibility activities, strengthening and PREs, TPDN, gait and balance training as needed   For all possible CPT codes, reference the Planned Interventions line above.     Check all conditions that are expected to impact treatment: {Conditions expected to impact treatment:Structural or anatomic abnormalities and Current pregnancy or recent postpartum   If treatment provided at initial evaluation, no treatment charged due to lack of authorization.       Judah Carchi M Deletha Jaffee, PT 08/30/2024, 2:33 PM

## 2024-08-31 ENCOUNTER — Ambulatory Visit

## 2024-08-31 DIAGNOSIS — M5459 Other low back pain: Secondary | ICD-10-CM

## 2024-08-31 DIAGNOSIS — M6281 Muscle weakness (generalized): Secondary | ICD-10-CM

## 2024-08-31 DIAGNOSIS — M5126 Other intervertebral disc displacement, lumbar region: Secondary | ICD-10-CM

## 2024-11-12 ENCOUNTER — Other Ambulatory Visit (HOSPITAL_COMMUNITY): Payer: Self-pay

## 2024-11-12 ENCOUNTER — Telehealth: Payer: Self-pay

## 2024-11-12 NOTE — Telephone Encounter (Signed)
 Pharmacy Patient Advocate Encounter  Insurance verification completed.   The patient is insured through [facility] MEDICAID   Ran test claim for [employer]. Currently a quantity of 30 is a 30 day supply and the co-pay is $0.00 . [place] $0.00  This test claim was processed through [facility]- copay amounts may vary at other pharmacies due to pharmacy/plan contracts, or as the patient moves through the different stages of their insurance plan.

## 2024-11-22 ENCOUNTER — Encounter: Payer: Self-pay | Admitting: Internal Medicine

## 2024-11-22 ENCOUNTER — Ambulatory Visit: Admitting: Internal Medicine

## 2024-11-22 ENCOUNTER — Other Ambulatory Visit: Payer: Self-pay

## 2024-11-22 ENCOUNTER — Ambulatory Visit (INDEPENDENT_AMBULATORY_CARE_PROVIDER_SITE_OTHER): Admitting: Pharmacist

## 2024-11-22 VITALS — BP 141/87 | HR 64

## 2024-11-22 DIAGNOSIS — B20 Human immunodeficiency virus [HIV] disease: Secondary | ICD-10-CM

## 2024-11-22 MED ORDER — BICTEGRAVIR-EMTRICITAB-TENOFOV 50-200-25 MG PO TABS: 1.0000 | ORAL_TABLET | Freq: Every day | ORAL | 2 refills | Status: DC

## 2024-11-22 NOTE — Progress Notes (Unsigned)
       [facility]     HPI: Heather Kelly is a 43 y.o. female presents for new HIV management. Last negative on 5/25 in epic Today: Pt recived a call form HD that contact(husbnad) + HIV. Pt has 2 month on toddler. (Baby tested negative).   Date of diagnosis: 10/25/24 ART exposure none Past Ois none Risk factors Heterosecua   Partners in last 2months 1, in the last 12 months 1.  Anal sex no   Social: Occupation: midwife Housing:Live in house with huband and 3 children(3) Support: mom, mother in social worker, father in social worker  and landscape architect Understanding of HIV: Etoh/drug/tobacco use: No/no/o  Past Medical History:  Diagnosis Date   Hypothyroidism    Sleep apnea     Past Surgical History:  Procedure Laterality Date   BARIATRIC SURGERY  04/10/2021   CESAREAN SECTION  2011   CESAREAN SECTION  2007    Family History  Problem Relation Age of Onset   Hypertension Mother    Cancer Father    Asthma Daughter    Heart disease Maternal Aunt    Hypertension Maternal Grandmother    Breast cancer Maternal Grandmother 2   Cancer Maternal Grandfather    Diabetes Neg Hx    Current Outpatient Medications on File Prior to Visit  Medication Sig Dispense Refill   levothyroxine  (SYNTHROID ) 75 MCG tablet Take 37.5-75 mcg by mouth See admin instructions. 37.5 mcg once daily every Monday 75 mcg once daily on all other days     Prenat-FeCbn-FeAsp-Meth-FA-DHA (PRENATE MINI ) 18-0.6-0.4-350 MG CAPS Take 1 capsule by mouth every evening. 90 capsule 3   No current facility-administered medications on file prior to visit.    No Known Allergies    Lab Results No results found for: HIV1RNAQUANT, HIV1RNAVL, CD4TABS No results found for: HIV1GENOSEQ Lab Results  Component Value Date   WBC 4.5 02/10/2023   HGB 13.4 02/10/2023   HCT 40.9 02/10/2023   MCV 80 02/10/2023   PLT 385 02/10/2023    Lab Results  Component Value Date   CREATININE 0.63 06/05/2022    BUN 9 06/05/2022   NA 136 06/05/2022   K 4.3 06/05/2022   CL 101 06/05/2022   CO2 22 06/05/2022   Lab Results  Component Value Date   ALT 16 06/05/2022   AST 20 06/05/2022   ALKPHOS 54 06/05/2022   BILITOT 0.3 06/05/2022    No results found for: CHOL, TRIG, HDL, LDLCALC No results found for: HAV Lab Results  Component Value Date   HEPBSAG Negative 04/16/2024   No results found for: HCVAB Lab Results  Component Value Date   CHLAMYDIAWP Negative 04/16/2024   N Negative 04/16/2024   No results found for: GCPROBEAPT No results found for: QUANTGOLD  Assessment/Plan #HIV/Newly Dx -start biktravy -HIV labs intake today -Met with pharmacy -Has dentist -F/U in 2weeks  #Hypothryoism -managed with PCP  #Vaccination COVID Flu-today Monkeypox Hpv 04/16/24 PCV Meningitis HepA serology HEpB serology Tdap 12/22/22 Shingles  #Health maintenance-today -Quantiferon -RPR -HCV -GC -Lipid -Dysplasia screen F->  06/05/22( follows with ob/gyn) -Mammogram -> 04/08/24 -Colonoscopy    Heather Yanes Dennise, MD [facility]

## 2024-11-22 NOTE — Progress Notes (Signed)
   11/22/2024  HPI: Heather Kelly is a 43 y.o. female who presents to the [facility] clinic today to initiate care for a newly diagnosed HIV infection.  Referring ID Provider: Dr. Dennise  Patient Active Problem List   Diagnosis Date Noted   OSA on CPAP 01/23/2021    Patient's Medications  New Prescriptions   No medications on file  Previous Medications   BICTEGRAVIR-EMTRICITABINE-TENOFOVIR AF (BIKTARVY) 50-200-25 MG TABS TABLET    Take 1 tablet by mouth daily. Try to take at the same time each day with or without food.   LEVOTHYROXINE  (SYNTHROID ) 75 MCG TABLET    Take 37.5-75 mcg by mouth See admin instructions. 37.5 mcg once daily every Monday 75 mcg once daily on all other days   PRENAT-FECBN-FEASP-METH-FA-DHA (PRENATE MINI ) 18-0.6-0.4-350 MG CAPS    Take 1 capsule by mouth every evening.  Modified Medications   No medications on file  Discontinued Medications   No medications on file    Labs: No results found for: HIV1RNAQUANT, HIV1RNAVL, CD4TABS  RPR and STI Lab Results  Component Value Date   LABRPR Non Reactive 04/16/2024   LABRPR NON REACTIVE 12/19/2022   LABRPR Non Reactive 10/03/2022   LABRPR Non Reactive 06/05/2022   LABRPR Non Reactive 10/28/2016    STI Results GC CT  04/16/2024 11:03 AM Negative  Negative   11/21/2022  2:23 PM Negative  Negative   06/24/2022 11:38 AM Negative  Negative   06/05/2022 11:22 AM Negative  Negative   10/19/2020 12:06 PM Negative  Negative   10/28/2016 12:00 AM Negative  **POSITIVE**   09/01/2015 12:00 AM Negative  Negative     Hepatitis B Lab Results  Component Value Date   HEPBSAG Negative 04/16/2024   Hepatitis C No results found for: HEPCAB, HCVRNAPCRQN Hepatitis A No results found for: HAV Lipids: No results found for: CHOL, TRIG, HDL, CHOLHDL, VLDL, LDLCALC  Current HIV Regimen: Treatment naive  Assessment: Heather Kelly is here today to initiate care with Dr. Dennise for her newly diagnosed HIV  infection.  She is treatment naive with initial lab work pending. Will start patient on Biktarvy.  Explained that Heather Kelly is a one pill once daily medication with or without food and the importance of not missing any doses. Explained resistance and how it develops and why it is so important to take Biktarvy daily and not skip days or doses. Counseled to take it around the same time each day. Counseled on what to do if dose is missed, if closer to missed dose take immediately, if closer to next dose then skip and resume normal schedule.   Cautioned on possible side effects the first week or so including nausea, diarrhea, dizziness, and headaches but that they should resolve after the first couple of weeks. I reviewed medications and found a drug interaction with her vitamins. Counseled to separate Biktarvy from divalent cations including multivitamins. Discussed to call clinic or send a MyChart message if she starts a new medication or herbal supplement. Answered all questions.   One week of samples were provided to patient today.   Plan: - Start Biktarvy  Heather Kelly, PharmD, BCIDP, AAHIVP, CPP Clinical Pharmacist Practitioner - Infectious Diseases Clinical Pharmacist Lead - Specialty Pharmacy [facility]

## 2024-11-22 NOTE — Progress Notes (Unsigned)
 Met briefly with Heather Kelly. Spent time discussing how medication works and HIV transmission. Provided reassurance that she does not pose any risk to household contacts. She has an almost 41-year old at home and has stopped breastfeeding since receiving her results. Her son's initial testing was negative, but she has not been able to schedule follow up testing yet.   Saia Derossett, BSN, RN

## 2024-11-23 ENCOUNTER — Emergency Department (HOSPITAL_COMMUNITY)
Admission: EM | Admit: 2024-11-23 | Discharge: 2024-11-23 | Disposition: A | Discharge status: Home / Self Care | Attending: Emergency Medicine | Admitting: Emergency Medicine

## 2024-11-23 ENCOUNTER — Other Ambulatory Visit: Payer: Self-pay

## 2024-11-23 DIAGNOSIS — B0221 Postherpetic geniculate ganglionitis: Secondary | ICD-10-CM

## 2024-11-23 LAB — SURESWAB® ADVANCED VAGINITIS PLUS,TMA
C. trachomatis RNA, TMA: NOT DETECTED
CANDIDA SPECIES: NOT DETECTED
Candida glabrata: NOT DETECTED
N. gonorrhoeae RNA, TMA: NOT DETECTED
SURESWAB(R) ADV BACTERIAL VAGINOSIS(BV),TMA: POSITIVE — AB
TRICHOMONAS VAGINALIS (TV),TMA: NOT DETECTED

## 2024-11-23 MED ORDER — PREDNISONE 20 MG PO TABS: 60.0000 mg | ORAL_TABLET | Freq: Every day | ORAL | 0 refills | Status: AC

## 2024-11-23 MED ORDER — TETRACAINE HCL 0.5 % OP SOLN
1.0000 [drp] | Freq: Once | OPHTHALMIC | Status: AC
  Administered 2024-11-23: 1 [drp] via OPHTHALMIC
  Filled 2024-11-23: qty 4

## 2024-11-23 MED ORDER — VALACYCLOVIR HCL 1 G PO TABS: 1000.0000 mg | ORAL_TABLET | Freq: Three times a day (TID) | ORAL | 0 refills | Status: AC

## 2024-11-23 MED ORDER — VALACYCLOVIR HCL 500 MG PO TABS
1000.0000 mg | ORAL_TABLET | Freq: Once | ORAL | Status: AC
  Administered 2024-11-23: 1000 mg via ORAL
  Filled 2024-11-23: qty 2

## 2024-11-23 MED ORDER — OXYCODONE HCL 5 MG PO TABS: 5.0000 mg | ORAL_TABLET | ORAL | 0 refills | Status: AC | PRN

## 2024-11-23 MED ORDER — GABAPENTIN 100 MG PO CAPS: 100.0000 mg | ORAL_CAPSULE | Freq: Three times a day (TID) | ORAL | 0 refills | Status: AC

## 2024-11-23 MED ORDER — FLUORESCEIN SODIUM 1 MG OP STRP
1.0000 | ORAL_STRIP | Freq: Once | OPHTHALMIC | Status: AC
  Administered 2024-11-23: 1 via OPHTHALMIC
  Filled 2024-11-23: qty 1

## 2024-11-23 MED ORDER — GABAPENTIN 100 MG PO CAPS
100.0000 mg | ORAL_CAPSULE | Freq: Once | ORAL | Status: AC
  Administered 2024-11-23: 100 mg via ORAL
  Filled 2024-11-23: qty 1

## 2024-11-23 MED ORDER — OXYCODONE-ACETAMINOPHEN 5-325 MG PO TABS
1.0000 | ORAL_TABLET | Freq: Once | ORAL | Status: AC
  Administered 2024-11-23: 1 via ORAL
  Filled 2024-11-23: qty 1

## 2024-11-23 NOTE — ED Provider Notes (Signed)
 [place] EMERGENCY DEPARTMENT AT [facility] Provider Note  CSN: [id number] Arrival date & time: 11/23/24 0600  Chief Complaint(s) Rash and Pruritis  HPI Heather Kelly is a 43 y.o. female here today with burning pain on the right side of her face and ear, tongue.  Patient states that this started last evening.  Has a recent diagnosis of HIV, was started on Biktarvy this week.   Past Medical History Past Medical History:  Diagnosis Date   Hypothyroidism    Sleep apnea    Patient Active Problem List   Diagnosis Date Noted   OSA on CPAP 01/23/2021   Home Medication(s) Prior to Admission medications   Medication Sig Start Date End Date Taking? Authorizing Provider  gabapentin  (NEURONTIN ) 100 MG capsule Take 1 capsule (100 mg total) by mouth 3 (three) times daily for 14 days. 11/23/24 12/07/24 Yes Mannie Pac T, DO  oxyCODONE  (ROXICODONE ) 5 MG immediate release tablet Take 1 tablet (5 mg total) by mouth every 4 (four) hours as needed for severe pain (pain score 7-10). 11/23/24  Yes Mannie Pac T, DO  predniSONE  (DELTASONE ) 20 MG tablet Take 3 tablets (60 mg total) by mouth daily for 7 days. 11/23/24 11/30/24 Yes Mannie Pac T, DO  valACYclovir  (VALTREX ) 1000 MG tablet Take 1 tablet (1,000 mg total) by mouth 3 (three) times daily. 11/23/24  Yes Mannie Pac T, DO  bictegravir-emtricitabine-tenofovir AF (BIKTARVY) 50-200-25 MG TABS tablet Take 1 tablet by mouth daily. Try to take at the same time each day with or without food. 11/22/24   Dennise Kingsley, MD  levothyroxine  (SYNTHROID ) 75 MCG tablet Take 37.5-75 mcg by mouth See admin instructions. 37.5 mcg once daily every Monday 75 mcg once daily on all other days 05/01/22   [provider]  Prenat-FeCbn-FeAsp-Meth-FA-DHA (PRENATE MINI ) 18-0.6-0.4-350 MG CAPS Take 1 capsule by mouth every evening. 02/10/23   Erik Kieth BROCKS, MD                                                                                                                                     Past Surgical History Past Surgical History:  Procedure Laterality Date   BARIATRIC SURGERY  04/10/2021   CESAREAN SECTION  2011   CESAREAN SECTION  2007   Family History Family History  Problem Relation Age of Onset   Hypertension Mother    Cancer Father    Asthma Daughter    Heart disease Maternal Aunt    Hypertension Maternal Grandmother    Breast cancer Maternal Grandmother 56   Cancer Maternal Grandfather    Diabetes Neg Hx     Social History Social History   Tobacco Use   Smoking status: Former    Current packs/day: 0.00    Types: Cigarettes    Quit date: 03/03/2016    Years since quitting: 8.7   Smokeless tobacco: Never  Vaping Use   Vaping status: Never  Used  Substance Use Topics   Alcohol use: Not Currently   Drug use: No   Allergies Patient has no known allergies.  Review of Systems Review of Systems  Physical Exam Vital Signs  I have reviewed the triage vital signs BP 132/65 (BP Location: Right Arm)   Pulse 61   Temp 99.1 F (37.3 C)   Resp 14   Ht 5' 9 (1.753 m)   Wt 107.7 kg   SpO2 100%   BMI 35.06 kg/m   Physical Exam Vitals and nursing note reviewed.  Constitutional:      General: She is in acute distress.  HENT:     Head: Normocephalic and atraumatic.     Right Ear: Tympanic membrane, ear canal and external ear normal.     Nose: Nose normal.     Mouth/Throat:     Mouth: Mucous membranes are dry.  Eyes:     Extraocular Movements: Extraocular movements intact.     Pupils: Pupils are equal, round, and reactive to light.     Comments: No fluorescein  uptake, no lesions.  Normal conjunctiva  Cardiovascular:     Rate and Rhythm: Normal rate.  Skin:    Comments: Sensitivity to light palpitation and a facial nerve distribution.  No rash.  Neurological:     General: No focal deficit present.     Mental Status: She is alert.     Cranial Nerves: No cranial nerve deficit.     ED Results  and Treatments Labs (all labs ordered are listed, but only abnormal results are displayed) Labs Reviewed - No data to display                                                                                                                        Radiology No results found.  Pertinent labs & imaging results that were available during my care of the patient were reviewed by me and considered in my medical decision making (see MDM for details).  Medications Ordered in ED Medications  valACYclovir  (VALTREX ) tablet 1,000 mg (has no administration in time range)  oxyCODONE -acetaminophen  (PERCOCET/ROXICET) 5-325 MG per tablet 1 tablet (1 tablet Oral Given 11/23/24 0735)  gabapentin  (NEURONTIN ) capsule 100 mg (100 mg Oral Given 11/23/24 0735)  tetracaine  (PONTOCAINE) 0.5 % ophthalmic solution 1 drop (1 drop Right Eye Given by Other 11/23/24 0759)  fluorescein  ophthalmic strip 1 strip (1 strip Right Eye Given by Other 11/23/24 0758)  Procedures Procedures  (including critical care time)  Medical Decision Making / ED Course   This patient presents to the ED for concern of burning facial pain, this involves an extensive number of treatment options, and is a complaint that carries with it a high risk of complications and morbidity.  The differential diagnosis includes Ramsay Hunt syndrome, herpes zoster, consider trigeminal neuralgia less likely CVA.  MDM: Patient gives a good story for Ramsay Hunt syndrome.  She has burning pain on the right side of her face that does not cross midline.  She has pain in her ear, and on the right side of her tongue.  Her symptoms began at 9 PM last evening.  I do not appreciate any rash or lesion at this time.  No facial weakness or drooping.  Will provide patient with analgesia, start her on valacyclovir .  I do not believe this patient  requires blood work at this time.  Reviewed side effects with Biktarvy, do not see this listed as a side effect.  I believe the patient's immunocompromise status, as well as stress of being started on new medication could have contributed to this.  Advised patient that she likely will develop a rash in the coming days.   Additional history obtained: -Additional history obtained from partner at bedside -External records from outside source obtained and reviewed including: Chart review including previous notes, labs, imaging, consultation notes   Lab Tests: -I ordered, reviewed, and interpreted labs.   The pertinent results include:   Labs Reviewed - No data to display    Medicines ordered and prescription drug management: Meds ordered this encounter  Medications   oxyCODONE -acetaminophen  (PERCOCET/ROXICET) 5-325 MG per tablet 1 tablet    Refill:  0   gabapentin  (NEURONTIN ) capsule 100 mg   tetracaine  (PONTOCAINE) 0.5 % ophthalmic solution 1 drop   fluorescein  ophthalmic strip 1 strip   valACYclovir  (VALTREX ) tablet 1,000 mg   oxyCODONE  (ROXICODONE ) 5 MG immediate release tablet    Sig: Take 1 tablet (5 mg total) by mouth every 4 (four) hours as needed for severe pain (pain score 7-10).    Dispense:  15 tablet    Refill:  0    Shingles   gabapentin  (NEURONTIN ) 100 MG capsule    Sig: Take 1 capsule (100 mg total) by mouth 3 (three) times daily for 14 days.    Dispense:  42 capsule    Refill:  0   valACYclovir  (VALTREX ) 1000 MG tablet    Sig: Take 1 tablet (1,000 mg total) by mouth 3 (three) times daily.    Dispense:  21 tablet    Refill:  0   predniSONE  (DELTASONE ) 20 MG tablet    Sig: Take 3 tablets (60 mg total) by mouth daily for 7 days.    Dispense:  21 tablet    Refill:  0    -I have reviewed the patients home medicines and have made adjustments as needed  Cardiac Monitoring: The patient was maintained on a cardiac monitor.  I personally viewed and interpreted the  cardiac monitored which showed an underlying rhythm of: Normal sinus rhythm  Reevaluation: After the interventions noted above, I reevaluated the patient and found that they have :improved  Co morbidities that complicate the patient evaluation  Past Medical History:  Diagnosis Date   Hypothyroidism    Sleep apnea       Dispostion: I considered admission for this patient, however she is appropriate for outpatient treatment.  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

## 2024-11-23 NOTE — ED Notes (Signed)
 Pt was given discharge instructions and verbalized understanding. Pt has someone at bedside that will help her get home.

## 2024-11-23 NOTE — Telephone Encounter (Signed)
 Called pt. Does not seem c/w biktarvy side effect. She has some ear pain on right side as well, Overall improving symptoms. She has started valtrex , continue biktarvy. Added pharmacy as fyi only

## 2024-11-23 NOTE — ED Triage Notes (Signed)
 Pt has redness and itching to eyes and face. Pt has tingling to tongue and trouble swallowing. Pt able to control secretions and talk in full sentences. Pt took new medication recently and this happened shortly after

## 2024-11-23 NOTE — Discharge Instructions (Addendum)
 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  This can be very painful, but the good news is that your symptoms started recently so we should be able to improve them with medication.  Here are the following medications I would like you to take:   At 1000 mg of Tylenol  every 8 hours 60 mg of prednisone  daily 1000 mg of valacyclovir  every 8 hours 5 mg of oxycodone  as needed for severe pain every 6 hours.  Do not take if you are not experiencing severe pain 100 mg of gabapentin  every 8 hours  All of these medicines will work much better if you take all of them, as they all work a little bit differently.  Please call your infectious diseases doctor today for follow-up appointment.  Return to the emergency room if you develop trouble with your vision in your right eye.

## 2024-11-24 ENCOUNTER — Telehealth: Payer: Self-pay | Admitting: Internal Medicine

## 2024-11-24 ENCOUNTER — Ambulatory Visit: Payer: Self-pay | Admitting: Internal Medicine

## 2024-11-24 ENCOUNTER — Other Ambulatory Visit: Payer: Self-pay | Admitting: Pharmacist

## 2024-11-24 MED ORDER — BICTEGRAVIR-EMTRICITAB-TENOFOV 50-200-25 MG PO TABS: 1.0000 | ORAL_TABLET | Freq: Every day | ORAL | Status: AC

## 2024-11-24 MED ORDER — METRONIDAZOLE 500 MG PO TABS: 500.0000 mg | ORAL_TABLET | Freq: Two times a day (BID) | ORAL | 0 refills | Status: AC

## 2024-11-24 NOTE — Telephone Encounter (Signed)
 I called the HD to verify next steps as we manage adult ID. They will get back to me in regards to next steps and protocol as to provide standard of care.

## 2024-11-24 NOTE — Progress Notes (Signed)
 Medication Samples have been provided to the patient.  Drug name: Biktarvy        Strength: 50/200/25 mg       Qty: 7 tablets (1 bottles) LOT: CVDSXA   Exp.Date: 1/28  Samples requested by Charlott Flowers, PharmD.  Dosing instructions: Take one tablet by mouth once daily  The patient has been instructed regarding the correct time, dose, and frequency of taking this medication, including desired effects and most common side effects.   Alan Geralds, PharmD, CPP, BCIDP, AAHIVP Clinical Pharmacist Practitioner Infectious Diseases Clinical Pharmacist [facility]

## 2024-11-24 NOTE — Telephone Encounter (Signed)
 Called and spoke with Clem at [facility] in regards to protocol/standard of care: They are retesting child in 90 days and deferring further testing to pediatrician, whom they plan to relay to pt to contact. Stop breastfeeding while pt is detectable Called pt: breastfeeding precaution were discussed at visit on 12/8 . Relayed  HD recommendation of pediatrician involvement to pt. Pt understood/ agreed with plan.

## 2024-11-25 ENCOUNTER — Telehealth: Payer: Self-pay

## 2024-11-25 NOTE — Telephone Encounter (Signed)
 Spoke with patient over the phone; she is very appreciative and aware of what may or may not be helpful for her and very open to suggestions. She understands this does not reflect a drug allergy given it is limited to the right side of her face. States she felt 100% yesterday until she took the Emma; states the symptoms worsen ~1-2 hours after she takes Radio Producer. Reviewed that even though her CD4 count is normal and she has only taken a couple days of Biktarvy, the viral load is even now rapidly decreasing which can cause her immune system to overreact with worsening shingles symptoms. She plans to space her Rainelle out differently from her other medications to see if this helps at all. Informed her that her symptoms should improve over the weekend as she continues Valtrex  but to please call us  on Monday if her symptoms worsen or do not improve. Encouraged her to visit the ED if her symptoms significantly worsen. GLENWOOD Palma

## 2024-11-25 NOTE — Telephone Encounter (Signed)
 error

## 2024-11-25 NOTE — Telephone Encounter (Signed)
 Patient called concerned her medications are interacting. Having facial burning, itching, tongue feels like its swelling on one side, denies SOB or any other sx's. Patient took Biktarvy last night along with Valtrex , Gabapentin , Oxycodone . Patient thinks Biktarvy and Valtrex  are interacting - during the day when she only takes Valtrex  she is fine. At night when she takes Biktarvy sx's start. Patient took 2 extra strength benadryl  as well as ibuprofen  last night with no relief. Please advise.   Heather Kelly Heather Kelly, CMA

## 2024-11-25 NOTE — Telephone Encounter (Signed)
 Could pharmacy speak to her in regards to meds. She has gone to the ed for similar symptoms though to be possibly ramsey hunt.

## 2024-11-30 LAB — CBC WITH DIFFERENTIAL/PLATELET
Absolute Lymphocytes: 1417 {cells}/uL (ref 850–3900)
Absolute Monocytes: 303 {cells}/uL (ref 200–950)
Basophils Absolute: 19 {cells}/uL (ref 0–200)
Basophils Relative: 0.5 %
Eosinophils Absolute: 163 {cells}/uL (ref 15–500)
Eosinophils Relative: 4.4 %
HCT: 43.2 % (ref 35.9–46.0)
Hemoglobin: 13.9 g/dL (ref 11.7–15.5)
MCH: 26.9 pg — ABNORMAL LOW (ref 27.0–33.0)
MCHC: 32.2 g/dL (ref 31.6–35.4)
MCV: 83.6 fL (ref 81.4–101.7)
MPV: 10.1 fL (ref 7.5–12.5)
Monocytes Relative: 8.2 %
Neutro Abs: 1798 {cells}/uL (ref 1500–7800)
Neutrophils Relative %: 48.6 %
Platelets: 310 Thousand/uL (ref 140–400)
RBC: 5.17 Million/uL — ABNORMAL HIGH (ref 3.80–5.10)
RDW: 12.8 % (ref 11.0–15.0)
Total Lymphocyte: 38.3 %
WBC: 3.7 Thousand/uL — ABNORMAL LOW (ref 3.8–10.8)

## 2024-11-30 LAB — LIPID PANEL
Cholesterol: 218 mg/dL — ABNORMAL HIGH (ref <200)
HDL: 69 mg/dL (ref >=50)
LDL Cholesterol (Calc): 132 mg/dL — ABNORMAL HIGH
Non-HDL Cholesterol (Calc): 149 mg/dL — ABNORMAL HIGH (ref <130)
Total CHOL/HDL Ratio: 3.2 (calc) (ref <5.0)
Triglycerides: 78 mg/dL (ref <150)

## 2024-11-30 LAB — COMPLETE METABOLIC PANEL WITHOUT GFR
AG Ratio: 1.5 (calc) (ref 1.0–2.5)
ALT: 10 U/L (ref 6–29)
AST: 20 U/L (ref 10–30)
Albumin: 4.7 g/dL (ref 3.6–5.1)
Alkaline phosphatase (APISO): 52 U/L (ref 31–125)
BUN: 13 mg/dL (ref 7–25)
CO2: 25 mmol/L (ref 20–32)
Calcium: 9.3 mg/dL (ref 8.6–10.2)
Chloride: 104 mmol/L (ref 98–110)
Creat: 0.85 mg/dL (ref 0.50–0.99)
Globulin: 3.2 g/dL (ref 1.9–3.7)
Glucose, Bld: 78 mg/dL (ref 65–99)
Potassium: 3.9 mmol/L (ref 3.5–5.3)
Sodium: 137 mmol/L (ref 135–146)
Total Bilirubin: 0.5 mg/dL (ref 0.2–1.2)
Total Protein: 7.9 g/dL (ref 6.1–8.1)

## 2024-11-30 LAB — HEPATITIS B SURFACE ANTIBODY,QUALITATIVE: Hep B S Ab: REACTIVE — AB

## 2024-11-30 LAB — HEPATITIS B CORE ANTIBODY, TOTAL: Hep B Core Total Ab: NONREACTIVE

## 2024-11-30 LAB — QUANTIFERON-TB GOLD PLUS
Mitogen-NIL: 7.58 [IU]/mL
NIL: 0.04 [IU]/mL
QuantiFERON-TB Gold Plus: NEGATIVE
TB1-NIL: <0 [IU]/mL
TB2-NIL: <0 [IU]/mL

## 2024-11-30 LAB — HIV-1 GENOTYPE: HIV-1 Genotype: DETECTED — AB

## 2024-11-30 LAB — HEPATITIS A ANTIBODY, TOTAL: Hepatitis A AB,Total: REACTIVE — AB

## 2024-11-30 LAB — HIV-1 RNA ULTRAQUANT REFLEX TO GENTYP+
HIV 1 RNA Quant: 13700 {copies}/mL — ABNORMAL HIGH
HIV-1 RNA Quant, Log: 4.14 {Log_copies}/mL — ABNORMAL HIGH

## 2024-11-30 LAB — HIV-1/2 AB - DIFFERENTIATION
HIV-1 antibody: POSITIVE — AB
HIV-2 Ab: NEGATIVE

## 2024-11-30 LAB — T-HELPER CELLS (CD4) COUNT (NOT AT ARMC)
Absolute CD4: 441 {cells}/uL — ABNORMAL LOW (ref 490–1740)
CD4 T Helper %: 30 % (ref 30–61)
Total lymphocyte count: 1479 {cells}/uL (ref 850–3900)

## 2024-11-30 LAB — HIV ANTIBODY (ROUTINE TESTING W REFLEX)
HIV 1&2 Ab, 4th Generation: REACTIVE
HIV FINAL INTERPRETATION: POSITIVE — AB

## 2024-11-30 LAB — HEPATITIS B SURFACE ANTIGEN: Hepatitis B Surface Ag: NONREACTIVE

## 2024-11-30 LAB — SYPHILIS: RPR W/REFLEX TO RPR TITER AND TREPONEMAL ANTIBODIES, TRADITIONAL SCREENING AND DIAGNOSIS ALGORITHM: RPR Ser Ql: NONREACTIVE

## 2024-11-30 LAB — HLA B*5701: HLA-B*5701 w/rflx HLA-B High: NEGATIVE

## 2024-11-30 LAB — HEPATITIS C ANTIBODY: Hepatitis C Ab: NONREACTIVE

## 2024-12-10 ENCOUNTER — Ambulatory Visit: Admitting: Infectious Diseases

## 2024-12-22 ENCOUNTER — Ambulatory Visit: Admitting: Internal Medicine

## 2024-12-28 ENCOUNTER — Ambulatory Visit: Admitting: Internal Medicine

## 2024-12-28 ENCOUNTER — Other Ambulatory Visit: Payer: Self-pay

## 2024-12-28 ENCOUNTER — Encounter: Payer: Self-pay | Admitting: Internal Medicine

## 2024-12-28 DIAGNOSIS — B20 Human immunodeficiency virus [HIV] disease: Secondary | ICD-10-CM

## 2024-12-28 DIAGNOSIS — B370 Candidal stomatitis: Secondary | ICD-10-CM

## 2024-12-28 DIAGNOSIS — E039 Hypothyroidism, unspecified: Secondary | ICD-10-CM | POA: Diagnosis not present

## 2024-12-28 DIAGNOSIS — Z79899 Other long term (current) drug therapy: Secondary | ICD-10-CM | POA: Diagnosis not present

## 2024-12-28 MED ORDER — LIDOCAINE VISCOUS HCL 2 % MT SOLN: 5.0000 mL | Freq: Three times a day (TID) | OROMUCOSAL | 0 refills | Status: DC | PRN

## 2024-12-28 MED ORDER — BICTEGRAVIR-EMTRICITAB-TENOFOV 50-200-25 MG PO TABS: 1.0000 | ORAL_TABLET | Freq: Every day | ORAL | 3 refills | Status: AC

## 2024-12-28 NOTE — Progress Notes (Signed)
 "      [facility]     HPI: Heather Kelly is a 44 y.o. female presents for new HIV management. Today: tolerating biktarvy. Tingling feeling resolved. Baby is doing well.    Date of diagnosis: 10/25/24 ART exposure none Past Ois none Risk factors Heterosexual Partners in last 2months 1, in the last 12 months 1.  Anal sex no     Social: Occupation: midwife Housing:Live in house with huband and 3 children(3) Support: mom, mother in social worker, father in social worker  and landscape architect Understanding of HIV: Etoh/drug/tobacco use: No/no/o  Past Medical History:  Diagnosis Date   Hypothyroidism    Sleep apnea     Past Surgical History:  Procedure Laterality Date   BARIATRIC SURGERY  04/10/2021   CESAREAN SECTION  2011   CESAREAN SECTION  2007    Family History  Problem Relation Age of Onset   Hypertension Mother    Cancer Father    Asthma Daughter    Heart disease Maternal Aunt    Hypertension Maternal Grandmother    Breast cancer Maternal Grandmother 67   Cancer Maternal Grandfather    Diabetes Neg Hx    Medications Ordered Prior to Encounter[1]  Allergies[2]    Lab Results HIV 1 RNA Quant (copies/mL)  Date Value  11/22/2024 13,700 (H)   No results found for: HIV1GENOSEQ Lab Results  Component Value Date   WBC 3.7 (L) 11/22/2024   HGB 13.9 11/22/2024   HCT 43.2 11/22/2024   MCV 83.6 11/22/2024   PLT 310 11/22/2024    Lab Results  Component Value Date   CREATININE 0.85 11/22/2024   BUN 13 11/22/2024   NA 137 11/22/2024   K 3.9 11/22/2024   CL 104 11/22/2024   CO2 25 11/22/2024   Lab Results  Component Value Date   ALT 10 11/22/2024   AST 20 11/22/2024   ALKPHOS 54 06/05/2022   BILITOT 0.5 11/22/2024    Lab Results  Component Value Date   CHOL 218 (H) 11/22/2024   TRIG 78 11/22/2024   HDL 69 11/22/2024   LDLCALC 132 (H) 11/22/2024   Lab Results  Component Value Date   HAV REACTIVE (A) 11/22/2024   Lab Results   Component Value Date   HEPBSAG NON-REACTIVE 11/22/2024   HEPBSAB REACTIVE (A) 11/22/2024   No results found for: HCVAB Lab Results  Component Value Date   CHLAMYDIAWP Negative 04/16/2024   N Negative 04/16/2024   No results found for: GCPROBEAPT No results found for: QUANTGOLD  Assessment/Plan #HIV/Newly Dx Cl 13k, cd4 441 on 11/22/24 -Continue biktarvy -HIV labs -F/U in 3 months   #Hypothryoism -managed with PCP    #thrush - white patch no dyphargia x 2.5 weeks -Magec mouthwash  #Vaccination COVID Flu-today Monkeypox Hpv 04/16/24 PCV Meningitis HepA immune 11/22/24 HEpB immune  11/22/24 Tdap 12/22/22 Shingles   #Health maintenance-today -Quantiferon negative 11/22/24 -RPR negative 11/22/24 -HCV negative 11/22/24 -GC negative 11/22/24 -Lipid The 10-year ASCVD risk score (Arnett DK, et al., 2019) is: 0.2%   Values used to calculate the score:     Age: 53 years     Clinically relevant sex: Female     Is Non-Hispanic African American: Yes     Diabetic: No     Tobacco smoker: No     Systolic Blood Pressure: 115 mmHg     Is BP treated: No     HDL Cholesterol: 69 mg/dL     Total Cholesterol: 218  mg/dL  -Dysplasia screen F->  06/05/22( follows with ob/gyn) -Mammogram -> 04/08/24 -Colonoscopy     Heather Mckendry Dennise, MD [facility] I personally spent a total of 42 minutes in the care of the patient today including preparing to see the patient, getting/reviewing separately obtained history, performing a medically appropriate exam/evaluation, counseling and educating, placing orders, documenting clinical information in the EHR, independently interpreting results, and communicating results.     [1]  Current Outpatient Medications on File Prior to Visit  Medication Sig Dispense Refill   bictegravir-emtricitabine-tenofovir AF (BIKTARVY) 50-200-25 MG TABS tablet Take 1 tablet by mouth daily. Try to take at the same  time each day with or without food. 30 tablet 2   levothyroxine  (SYNTHROID ) 50 MCG tablet Take 50 mcg by mouth daily before breakfast.     gabapentin  (NEURONTIN ) 100 MG capsule Take 1 capsule (100 mg total) by mouth 3 (three) times daily for 14 days. (Patient not taking: Reported on 12/28/2024) 42 capsule 0   oxyCODONE  (ROXICODONE ) 5 MG immediate release tablet Take 1 tablet (5 mg total) by mouth every 4 (four) hours as needed for severe pain (pain score 7-10). (Patient not taking: Reported on 12/28/2024) 15 tablet 0   valACYclovir  (VALTREX ) 1000 MG tablet Take 1 tablet (1,000 mg total) by mouth 3 (three) times daily. (Patient not taking: Reported on 12/28/2024) 21 tablet 0   No current facility-administered medications on file prior to visit.  [2] No Known Allergies  "

## 2024-12-29 ENCOUNTER — Encounter: Payer: Self-pay | Admitting: Internal Medicine

## 2024-12-29 ENCOUNTER — Other Ambulatory Visit: Payer: Self-pay | Admitting: Internal Medicine

## 2024-12-29 LAB — T-HELPER CELLS (CD4) COUNT (NOT AT ARMC)
CD4 % Helper T Cell: 32 % — ABNORMAL LOW (ref 33–65)
CD4 T Cell Abs: 340 /uL — ABNORMAL LOW (ref 400–1790)

## 2024-12-29 MED ORDER — FLUCONAZOLE 150 MG PO TABS: 150.0000 mg | ORAL_TABLET | Freq: Once | ORAL | 2 refills | Status: AC

## 2024-12-29 NOTE — Progress Notes (Signed)
 Fluconazole  x 1(150 mg)  2 refills sent in

## 2024-12-29 NOTE — Telephone Encounter (Signed)
 Yes, ok with typical dosing 1:1:1 I will send in fluconazole 

## 2024-12-29 NOTE — Telephone Encounter (Signed)
 Called [facility] to find out if they are able to fill the magic mouthwash. Pharmacy wanted to confirm provider wanted 1:1:1 ratio. Pharmacist confirms this is typical dosing. Asked that they dispense as ordered.   Cynthea Zachman, BSN, RN

## 2024-12-30 ENCOUNTER — Other Ambulatory Visit: Payer: Self-pay | Admitting: Internal Medicine

## 2024-12-30 ENCOUNTER — Other Ambulatory Visit (HOSPITAL_COMMUNITY): Payer: Self-pay

## 2024-12-30 LAB — COMPLETE METABOLIC PANEL WITHOUT GFR
AG Ratio: 1.4 (calc) (ref 1.0–2.5)
ALT: 18 U/L (ref 6–29)
AST: 32 U/L — ABNORMAL HIGH (ref 10–30)
Albumin: 4.1 g/dL (ref 3.6–5.1)
Alkaline phosphatase (APISO): 47 U/L (ref 31–125)
BUN/Creatinine Ratio: 8 (calc) (ref 6–22)
BUN: 8 mg/dL (ref 7–25)
CO2: 23 mmol/L (ref 20–32)
Calcium: 9.4 mg/dL (ref 8.6–10.2)
Chloride: 97 mmol/L — ABNORMAL LOW (ref 98–110)
Creat: 1.03 mg/dL — ABNORMAL HIGH (ref 0.50–0.99)
Globulin: 2.9 g/dL (ref 1.9–3.7)
Glucose, Bld: 60 mg/dL — ABNORMAL LOW (ref 65–99)
Potassium: 3.5 mmol/L (ref 3.5–5.3)
Sodium: 137 mmol/L (ref 135–146)
Total Bilirubin: 0.7 mg/dL (ref 0.2–1.2)
Total Protein: 7 g/dL (ref 6.1–8.1)

## 2024-12-30 LAB — HIV-1 RNA QUANT-NO REFLEX-BLD
HIV 1 RNA Quant: <20 {copies}/mL — AB
HIV-1 RNA Quant, Log: <1.3 {Log_copies}/mL — AB

## 2024-12-30 LAB — CBC WITH DIFFERENTIAL/PLATELET
Absolute Lymphocytes: 1211 {cells}/uL (ref 850–3900)
Absolute Monocytes: 363 {cells}/uL (ref 200–950)
Basophils Absolute: 20 {cells}/uL (ref 0–200)
Basophils Relative: 0.6 %
Eosinophils Absolute: 79 {cells}/uL (ref 15–500)
Eosinophils Relative: 2.4 %
HCT: 44.3 % (ref 35.9–46.0)
Hemoglobin: 14.3 g/dL (ref 11.7–15.5)
MCH: 26.4 pg — ABNORMAL LOW (ref 27.0–33.0)
MCHC: 32.3 g/dL (ref 31.6–35.4)
MCV: 81.9 fL (ref 81.4–101.7)
MPV: 11.4 fL (ref 7.5–12.5)
Monocytes Relative: 11 %
Neutro Abs: 1627 {cells}/uL (ref 1500–7800)
Neutrophils Relative %: 49.3 %
Platelets: 245 Thousand/uL (ref 140–400)
RBC: 5.41 Million/uL — ABNORMAL HIGH (ref 3.80–5.10)
RDW: 14.9 % (ref 11.0–15.0)
Total Lymphocyte: 36.7 %
WBC: 3.3 Thousand/uL — ABNORMAL LOW (ref 3.8–10.8)

## 2024-12-30 MED ORDER — NYSTATIN 100000 UNIT/ML MT SUSP: 5.0000 mL | Freq: Four times a day (QID) | OROMUCOSAL | 0 refills | Status: DC

## 2024-12-30 NOTE — Telephone Encounter (Signed)
 Placed nystatin  swish and swallow order

## 2024-12-30 NOTE — Telephone Encounter (Signed)
 Hi, Heather Kelly is there a way to cover the magic mouthwash

## 2024-12-30 NOTE — Progress Notes (Signed)
 nystatin 

## 2025-01-03 ENCOUNTER — Ambulatory Visit: Payer: Self-pay | Admitting: Internal Medicine

## 2025-01-03 ENCOUNTER — Other Ambulatory Visit: Payer: Self-pay | Admitting: Internal Medicine

## 2025-01-03 MED ORDER — LIDOCAINE VISCOUS HCL 2 % MT SOLN: 5.0000 mL | Freq: Three times a day (TID) | OROMUCOSAL | 1 refills | Status: AC | PRN

## 2025-01-03 NOTE — Telephone Encounter (Signed)
 Stable labs overall. Cd4 count over 200 no need for OI prophylaxis. Will place refill for mouthwash

## 2025-01-03 NOTE — Progress Notes (Signed)
Magic mouthwash refill.

## 2025-01-03 NOTE — Progress Notes (Signed)
 Congratulations: Viral load non-detectable. No changes to hiv meds

## 2025-01-03 NOTE — Telephone Encounter (Signed)
 Its a 360 ml bottle , should not require additional refills. Directions: Swish and spit 5 mLs 3 (three) times daily as needed for mouth pain.

## 2025-01-04 ENCOUNTER — Other Ambulatory Visit: Payer: Self-pay | Admitting: Internal Medicine

## 2025-01-04 MED ORDER — NYSTATIN 100000 UNIT/ML MT SUSP: 5.0000 mL | Freq: Four times a day (QID) | OROMUCOSAL | 0 refills | Status: AC

## 2025-01-04 NOTE — Progress Notes (Signed)
 Nystatin refilled.

## 2025-01-19 ENCOUNTER — Telehealth: Payer: Self-pay

## 2025-01-19 NOTE — Telephone Encounter (Signed)
 Videl's son, Jiro, is due for 90-day HIV testing next week. [facility] with [facility], he will reach out to patient to coordinate testing with the HD.   Giavonna Pflum, BSN, RN

## 2025-03-23 ENCOUNTER — Ambulatory Visit: Payer: Self-pay | Admitting: Internal Medicine
